# Patient Record
Sex: Male | Born: 1955
Health system: Southern US, Community
[De-identification: ages and names within clinical notes are randomized; demographics above are authoritative.]

## PROBLEM LIST (undated history)

## (undated) DIAGNOSIS — M1990 Unspecified osteoarthritis, unspecified site: Secondary | ICD-10-CM

## (undated) DIAGNOSIS — R7881 Bacteremia: Secondary | ICD-10-CM

## (undated) DIAGNOSIS — K4091 Unilateral inguinal hernia, without obstruction or gangrene, recurrent: Secondary | ICD-10-CM

## (undated) DIAGNOSIS — Z923 Personal history of irradiation: Secondary | ICD-10-CM

## (undated) HISTORY — PX: NO PAST SURGERIES: SHX2092

## (undated) HISTORY — DX: Unspecified osteoarthritis, unspecified site: M19.90

---

## 2001-07-07 ENCOUNTER — Encounter: Payer: Self-pay | Admitting: Emergency Medicine

## 2001-07-07 ENCOUNTER — Emergency Department (HOSPITAL_COMMUNITY): Admission: EM | Admit: 2001-07-07 | Discharge: 2001-07-07 | Payer: Self-pay | Admitting: Emergency Medicine

## 2002-08-30 ENCOUNTER — Emergency Department (HOSPITAL_COMMUNITY): Admission: EM | Admit: 2002-08-30 | Discharge: 2002-08-30 | Payer: Self-pay | Admitting: Emergency Medicine

## 2004-05-28 ENCOUNTER — Emergency Department (HOSPITAL_COMMUNITY): Admission: EM | Admit: 2004-05-28 | Discharge: 2004-05-28 | Payer: Self-pay | Admitting: Emergency Medicine

## 2014-08-26 ENCOUNTER — Encounter (HOSPITAL_COMMUNITY): Payer: Self-pay

## 2014-08-26 ENCOUNTER — Emergency Department (INDEPENDENT_AMBULATORY_CARE_PROVIDER_SITE_OTHER)
Admission: EM | Admit: 2014-08-26 | Discharge: 2014-08-26 | Disposition: A | Discharge status: Home / Self Care | Payer: Self-pay | Source: Home / Self Care | Attending: Emergency Medicine | Admitting: Emergency Medicine

## 2014-08-26 DIAGNOSIS — K4090 Unilateral inguinal hernia, without obstruction or gangrene, not specified as recurrent: Secondary | ICD-10-CM

## 2014-08-26 MED ORDER — TRAMADOL HCL 50 MG PO TABS: 50.0000 mg | ORAL_TABLET | Freq: Four times a day (QID) | ORAL | Status: DC | PRN

## 2014-08-26 NOTE — ED Provider Notes (Signed)
CSN: [id number]     Arrival date & time 08/26/14  0803 History   First MD Initiated Contact with Patient 08/26/14 [phone]     Chief Complaint  Patient presents with  . Hernia   (Consider location/radiation/quality/duration/timing/severity/associated sxs/prior Treatment) HPI  He is a 59 year old man here for evaluation of a hernia. He states for at least 2 months he has had bulging in the right groin. It is associated with pain. He states anytime he stands up there is a bulge in the right groin. He is wearing tight underwear with a rolled up towel to help keep the hernia reduced. When he lays flat there is no bulge. It is tender. No difficulty urinating. He is having normal bowel movements. No fevers.  History reviewed. No pertinent past medical history. History reviewed. No pertinent past surgical history. History reviewed. No pertinent family history. History  Substance Use Topics  . Smoking status: Current Every Day Smoker  . Smokeless tobacco: Not on file  . Alcohol Use: Yes    Review of Systems  Constitutional: Negative for fever.  Gastrointestinal: Negative for abdominal pain, diarrhea and constipation.  Genitourinary: Negative for difficulty urinating.       Groin swelling and tenderness    Allergies  Review of patient's allergies indicates no known allergies.  Home Medications   Prior to Admission medications   Medication Sig Start Date End Date Taking? Authorizing Provider  traMADol (ULTRAM) 50 MG tablet Take 1 tablet (50 mg total) by mouth every 6 (six) hours as needed. 08/26/14   Charm RingsErin J Lorenza Shakir, MD   BP 93/52 mmHg  Pulse 92  Temp(Src) 98.4 F (36.9 C) (Oral)  Resp 18  SpO2 100% Physical Exam  Constitutional: He is oriented to person, place, and time. He appears well-developed and well-nourished. No distress.  Cardiovascular: Normal rate.   Pulmonary/Chest: Effort normal.  Abdominal: Soft. A hernia is present. Hernia confirmed positive in the right inguinal area.  Hernia confirmed negative in the left inguinal area.  Genitourinary: Testes normal.     Neurological: He is alert and oriented to person, place, and time.    ED Course  Procedures (including critical care time) Labs Review Labs Reviewed - No data to display  Imaging Review No results found.   MDM   1. Right inguinal hernia    Recommended follow-up with surgery to discuss elective repair. This may be an issue as he does not have insurance. Reviewed signs of strangulation and incarceration that warrant immediate evaluation in the emergency room. Prescription for tramadol provided to use as needed for severe pain.    Charm RingsErin J Ritta Hammes, MD 08/26/14 [id number]

## 2014-08-26 NOTE — Discharge Instructions (Signed)
You have a hernia. Please call the surgeon's office to set up an appointment, as this will need to be fixed surgically. Take Tylenol or ibuprofen as needed for pain. I have provided a few tablets of tramadol to use for severe pain. If that bulge does not go down when you lay down, it becomes red, hard, very tender, or you develop fevers, please go directly to the emergency room.  Inguinal Hernia, Adult Muscles help keep everything in the body in its proper place. But if a weak spot in the muscles develops, something can poke through. That is called a hernia. When this happens in the lower part of the belly (abdomen), it is called an inguinal hernia. (It takes its name from a part of the body in this region called the inguinal canal.) A weak spot in the wall of muscles lets some fat or part of the small intestine bulge through. An inguinal hernia can develop at any age. Men get them more often than women. CAUSES  In adults, an inguinal hernia develops over time.  It can be triggered by:  Suddenly straining the muscles of the lower abdomen.  Lifting heavy objects.  Straining to have a bowel movement. Difficult bowel movements (constipation) can lead to this.  Constant coughing. This may be caused by smoking or lung disease.  Being overweight.  Being pregnant.  Working at a job that requires long periods of standing or heavy lifting.  Having had an inguinal hernia before. One type can be an emergency situation. It is called a strangulated inguinal hernia. It develops if part of the small intestine slips through the weak spot and cannot get back into the abdomen. The blood supply can be cut off. If that happens, part of the intestine may die. This situation requires emergency surgery. SYMPTOMS  Often, a small inguinal hernia has no symptoms. It is found when a healthcare provider does a physical exam. Larger hernias usually have symptoms.   In adults, symptoms may include:  A lump in  the groin. This is easier to see when the person is standing. It might disappear when lying down.  In men, a lump in the scrotum.  Pain or burning in the groin. This occurs especially when lifting, straining or coughing.  A dull ache or feeling of pressure in the groin.  Signs of a strangulated hernia can include:  A bulge in the groin that becomes very painful and tender to the touch.  A bulge that turns red or purple.  Fever, nausea and vomiting.  Inability to have a bowel movement or to pass gas. DIAGNOSIS  To decide if you have an inguinal hernia, a healthcare provider will probably do a physical examination.  This will include asking questions about any symptoms you have noticed.  The healthcare provider might feel the groin area and ask you to cough. If an inguinal hernia is felt, the healthcare provider may try to slide it back into the abdomen.  Usually no other tests are needed. TREATMENT  Treatments can vary. The size of the hernia makes a difference. Options include:  Watchful waiting. This is often suggested if the hernia is small and you have had no symptoms.  No medical procedure will be done unless symptoms develop.  You will need to watch closely for symptoms. If any occur, contact your healthcare provider right away.  Surgery. This is used if the hernia is larger or you have symptoms.  Open surgery. This is usually an outpatient  procedure (you will not stay overnight in a hospital). An cut (incision) is made through the skin in the groin. The hernia is put back inside the abdomen. The weak area in the muscles is then repaired by herniorrhaphy or hernioplasty. Herniorrhaphy: in this type of surgery, the weak muscles are sewn back together. Hernioplasty: a patch or mesh is used to close the weak area in the abdominal wall.  Laparoscopy. In this procedure, a surgeon makes small incisions. A thin tube with a tiny video camera (called a laparoscope) is put into the  abdomen. The surgeon repairs the hernia with mesh by looking with the video camera and using two long instruments. HOME CARE INSTRUCTIONS   After surgery to repair an inguinal hernia:  You will need to take pain medicine prescribed by your healthcare provider. Follow all directions carefully.  You will need to take care of the wound from the incision.  Your activity will be restricted for awhile. This will probably include no heavy lifting for several weeks. You also should not do anything too active for a few weeks. When you can return to work will depend on the type of job that you have.  During "watchful waiting" periods, you should:  Maintain a healthy weight.  Eat a diet high in fiber (fruits, vegetables and whole grains).  Drink plenty of fluids to avoid constipation. This means drinking enough water and other liquids to keep your urine clear or pale yellow.  Do not lift heavy objects.  Do not stand for long periods of time.  Quit smoking. This should keep you from developing a frequent cough. SEEK MEDICAL CARE IF:   A bulge develops in your groin area.  You feel pain, a burning sensation or pressure in the groin. This might be worse if you are lifting or straining.  You develop a fever of more than 100.5 F (38.1 C). SEEK IMMEDIATE MEDICAL CARE IF:   Pain in the groin increases suddenly.  A bulge in the groin gets bigger suddenly and does not go down.  For men, there is sudden pain in the scrotum. Or, the size of the scrotum increases.  A bulge in the groin area becomes red or purple and is painful to touch.  You have nausea or vomiting that does not go away.  You feel your heart beating much faster than normal.  You cannot have a bowel movement or pass gas.  You develop a fever of more than 102.0 F (38.9 C). Document Released: 09/08/2008 Document Revised: 07/15/2011 Document Reviewed: 09/08/2008 [facility] Patient Information 2015 [place], [place]. This  information is not intended to replace advice given to you by your health care provider. Make sure you discuss any questions you have with your health care provider.

## 2014-08-26 NOTE — ED Notes (Signed)
C/o pain in groin x couple of months , worse when standing for prolonged periods of time . Denies bowel problems  Or UA problems, passing gas w/o problems

## 2015-08-01 ENCOUNTER — Emergency Department (HOSPITAL_COMMUNITY): Payer: Self-pay

## 2015-08-01 ENCOUNTER — Encounter (HOSPITAL_COMMUNITY): Payer: Self-pay | Admitting: Emergency Medicine

## 2015-08-01 ENCOUNTER — Emergency Department (HOSPITAL_COMMUNITY)
Admission: EM | Admit: 2015-08-01 | Discharge: 2015-08-01 | Disposition: A | Discharge status: Home / Self Care | Payer: Self-pay | Attending: Emergency Medicine | Admitting: Emergency Medicine

## 2015-08-01 DIAGNOSIS — F17200 Nicotine dependence, unspecified, uncomplicated: Secondary | ICD-10-CM | POA: Insufficient documentation

## 2015-08-01 DIAGNOSIS — J101 Influenza due to other identified influenza virus with other respiratory manifestations: Secondary | ICD-10-CM | POA: Insufficient documentation

## 2015-08-01 LAB — CBC
HCT: 48.6 % (ref 39.0–52.0)
Hemoglobin: 17 g/dL (ref 13.0–17.0)
MCH: 30.5 pg (ref 26.0–34.0)
MCHC: 35 g/dL (ref 30.0–36.0)
MCV: 87.1 fL (ref 78.0–100.0)
PLATELETS: 204 10*3/uL (ref 150–400)
RBC: 5.58 MIL/uL (ref 4.22–5.81)
RDW: 14.1 % (ref 11.5–15.5)
WBC: 5.7 10*3/uL (ref 4.0–10.5)

## 2015-08-01 LAB — COMPREHENSIVE METABOLIC PANEL
ALT: 45 U/L (ref 17–63)
AST: 81 U/L — AB (ref 15–41)
Albumin: 3.8 g/dL (ref 3.5–5.0)
Alkaline Phosphatase: 78 U/L (ref 38–126)
Anion gap: 10 (ref 5–15)
BILIRUBIN TOTAL: 0.6 mg/dL (ref 0.3–1.2)
BUN: 5 mg/dL — AB (ref 6–20)
CO2: 25 mmol/L (ref 22–32)
CREATININE: 1.07 mg/dL (ref 0.61–1.24)
Calcium: 9.1 mg/dL (ref 8.9–10.3)
Chloride: 101 mmol/L (ref 101–111)
Glucose, Bld: 113 mg/dL — ABNORMAL HIGH (ref 65–99)
Potassium: 4.3 mmol/L (ref 3.5–5.1)
Sodium: 136 mmol/L (ref 135–145)
TOTAL PROTEIN: 7.4 g/dL (ref 6.5–8.1)

## 2015-08-01 LAB — I-STAT CG4 LACTIC ACID, ED
LACTIC ACID, VENOUS: 2.87 mmol/L — AB (ref 0.5–2.0)
LACTIC ACID, VENOUS: 2.36 mmol/L — AB (ref 0.5–2.0)

## 2015-08-01 LAB — INFLUENZA PANEL BY PCR (TYPE A & B)
H1N1FLUPCR: NOT DETECTED
INFLAPCR: NEGATIVE
INFLBPCR: POSITIVE — AB

## 2015-08-01 LAB — LIPASE, BLOOD: LIPASE: 21 U/L (ref 11–51)

## 2015-08-01 MED ORDER — SODIUM CHLORIDE 0.9 % IV BOLUS (SEPSIS)
2000.0000 mL | Freq: Once | INTRAVENOUS | Status: AC
  Administered 2015-08-01: 2000 mL via INTRAVENOUS

## 2015-08-01 MED ORDER — ALBUTEROL (5 MG/ML) CONTINUOUS INHALATION SOLN
10.0000 mg/h | INHALATION_SOLUTION | Freq: Once | RESPIRATORY_TRACT | Status: AC
  Administered 2015-08-01: 10 mg/h via RESPIRATORY_TRACT
  Filled 2015-08-01: qty 20

## 2015-08-01 MED ORDER — OSELTAMIVIR PHOSPHATE 75 MG PO CAPS
75.0000 mg | ORAL_CAPSULE | Freq: Once | ORAL | Status: AC
  Administered 2015-08-01: 75 mg via ORAL
  Filled 2015-08-01: qty 1

## 2015-08-01 MED ORDER — SODIUM CHLORIDE 0.9 % IV BOLUS (SEPSIS): 1000.0000 mL | INTRAVENOUS | Status: DC

## 2015-08-01 MED ORDER — DEXTROSE 5 % IV SOLN
1.0000 g | Freq: Once | INTRAVENOUS | Status: AC
  Administered 2015-08-01: 1 g via INTRAVENOUS
  Filled 2015-08-01: qty 10

## 2015-08-01 MED ORDER — ALBUTEROL SULFATE HFA 108 (90 BASE) MCG/ACT IN AERS
2.0000 | INHALATION_SPRAY | RESPIRATORY_TRACT | Status: DC | PRN
  Administered 2015-08-01: 2 via RESPIRATORY_TRACT
  Filled 2015-08-01: qty 6.7

## 2015-08-01 MED ORDER — AZITHROMYCIN 250 MG PO TABS
500.0000 mg | ORAL_TABLET | Freq: Once | ORAL | Status: AC
  Administered 2015-08-01: 500 mg via ORAL
  Filled 2015-08-01: qty 2

## 2015-08-01 MED ORDER — OSELTAMIVIR PHOSPHATE 75 MG PO CAPS: 75.0000 mg | ORAL_CAPSULE | Freq: Two times a day (BID) | ORAL | Status: DC

## 2015-08-01 MED ORDER — AEROCHAMBER PLUS W/MASK MISC
1.0000 | Freq: Once | Status: AC
  Administered 2015-08-01: 1
  Filled 2015-08-01: qty 1

## 2015-08-01 MED ORDER — ACETAMINOPHEN 325 MG PO TABS
ORAL_TABLET | ORAL | Status: DC
Start: 2015-08-01 — End: 2015-08-02
  Filled 2015-08-01: qty 1

## 2015-08-01 MED ORDER — ACETAMINOPHEN 325 MG PO TABS
650.0000 mg | ORAL_TABLET | Freq: Once | ORAL | Status: AC | PRN
  Administered 2015-08-01: 650 mg via ORAL

## 2015-08-01 NOTE — Discharge Instructions (Signed)
Get plenty of rest, and drink a lot of fluids. Take Tylenol every 4 hours, for fever.   Influenza, Adult Influenza ("the flu") is a viral infection of the respiratory tract. It occurs more often in winter months because people spend more time in close contact with one another. Influenza can make you feel very sick. Influenza easily spreads from person to person (contagious). CAUSES  Influenza is caused by a virus that infects the respiratory tract. You can catch the virus by breathing in droplets from an infected person's cough or sneeze. You can also catch the virus by touching something that was recently contaminated with the virus and then touching your mouth, nose, or eyes. RISKS AND COMPLICATIONS You may be at risk for a more severe case of influenza if you smoke cigarettes, have diabetes, have chronic heart disease (such as heart failure) or lung disease (such as asthma), or if you have a weakened immune system. Elderly people and pregnant women are also at risk for more serious infections. The most common problem of influenza is a lung infection (pneumonia). Sometimes, this problem can require emergency medical care and may be life threatening. SIGNS AND SYMPTOMS  Symptoms typically last 4 to 10 days and may include:  Fever.  Chills.  Headache, body aches, and muscle aches.  Sore throat.  Chest discomfort and cough.  Poor appetite.  Weakness or feeling tired.  Dizziness.  Nausea or vomiting. DIAGNOSIS  Diagnosis of influenza is often made based on your history and a physical exam. A nose or throat swab test can be done to confirm the diagnosis. TREATMENT  In mild cases, influenza goes away on its own. Treatment is directed at relieving symptoms. For more severe cases, your health care provider may prescribe antiviral medicines to shorten the sickness. Antibiotic medicines are not effective because the infection is caused by a virus, not by bacteria. HOME CARE  INSTRUCTIONS  Take medicines only as directed by your health care provider.  Use a cool mist humidifier to make breathing easier.  Get plenty of rest until your temperature returns to normal. This usually takes 3 to 4 days.  Drink enough fluid to keep your urine clear or pale yellow.  Cover yourmouth and nosewhen coughing or sneezing,and wash your handswellto prevent thevirusfrom spreading.  Stay homefromwork orschool untilthe fever is gonefor at least 451full day. PREVENTION  An annual influenza vaccination (flu shot) is the best way to avoid getting influenza. An annual flu shot is now routinely recommended for all adults in the [place] SEEK MEDICAL CARE IF:  You experiencechest pain, yourcough worsens,or you producemore mucus.  Youhave nausea,vomiting, ordiarrhea.  Your fever returns or gets worse. SEEK IMMEDIATE MEDICAL CARE IF:  You havetrouble breathing, you become short of breath,or your skin ornails becomebluish.  You have severe painor stiffnessin the neck.  You develop a sudden headache, or pain in the face or ear.  You have nausea or vomiting that you cannot control. MAKE SURE YOU:   Understand these instructions.  Will watch your condition.  Will get help right away if you are not doing well or get worse.   This information is not intended to replace advice given to you by your health care provider. Make sure you discuss any questions you have with your health care provider.   Document Released: 04/19/2000 Document Revised: 05/13/2014 Document Reviewed: 07/22/2011 Elsevier Interactive Patient Education [employer]2016 [employer] Inc.

## 2015-08-01 NOTE — Progress Notes (Signed)
Pharmacy Code Sepsis Protocol  Time of code sepsis page: 1706 [x]  Antibiotics delivered at 1715  Were antibiotics ordered at the time of the code sepsis page? Yes Was it required to contact the physician? [x]  Physician not contacted []  Physician contacted to order antibiotics for code sepsis []  Physician contacted to recommend changing antibiotics  Anti-infectives    Start     Dose/Rate Route Frequency Ordered Stop   08/01/15 1715  cefTRIAXone (ROCEPHIN) 1 g in dextrose 5 % 50 mL IVPB     1 g 100 mL/hr over 30 Minutes Intravenous  Once 08/01/15 1702     08/01/15 1715  azithromycin (ZITHROMAX) tablet 500 mg     500 mg Oral  Once 08/01/15 1702          Nurse education provided: [x]  Minutes left to administer antibiotics to achieve 1 hour goal [x]  Correct order of antibiotic administration [x]  Antibiotic Y-site compatibilities    Lynette Topete C. Marvis MoellerMiles, PharmD Pharmacy Resident  Pager: [phone] 08/01/2015 5:09 PM

## 2015-08-01 NOTE — ED Provider Notes (Signed)
CSN: [id number]     Arrival date & time 08/01/15  7425 History   First MD Initiated Contact with Patient 08/01/15 1613     Chief Complaint  Patient presents with  . Abdominal Pain  . Cough     (Consider location/radiation/quality/duration/timing/severity/associated sxs/prior Treatment) HPI   Marcus Pace is a 60 y.o. male who presents for evaluation of shortness of breath. He is also felt feverish for several days. He has a cough which is nonproductive. He denies ear pain, sore throat, myalgia, nausea, vomiting, dizziness, chest pain, abdominal pain or back pain. He does not have a primary care doctor, and has no chronic medical conditions. He smokes cigarettes. There are no other known modifying factors.   History reviewed. No pertinent past medical history. History reviewed. No pertinent past surgical history. No family history on file. Social History  Substance Use Topics  . Smoking status: Current Every Day Smoker  . Smokeless tobacco: None  . Alcohol Use: Yes    Review of Systems  All other systems reviewed and are negative.     Allergies  Review of patient's allergies indicates no known allergies.  Home Medications   Prior to Admission medications   Medication Sig Start Date End Date Taking? Authorizing Provider  oseltamivir (TAMIFLU) 75 MG capsule Take 1 capsule (75 mg total) by mouth every 12 (twelve) hours. 08/01/15   Mancel Bale, MD  traMADol (ULTRAM) 50 MG tablet Take 1 tablet (50 mg total) by mouth every 6 (six) hours as needed. Patient not taking: Reported on 08/01/2015 08/26/14   Charm Rings, MD   BP 134/84 mmHg  Pulse 105  Temp(Src) 100.8 F (38.2 C) (Oral)  Resp 36  Ht  (1.854 m)  Wt 135 lb 1 oz (61.264 kg)  BMI 17.82 kg/m2  SpO2 97% Physical Exam  Constitutional: He is oriented to person, place, and time. He appears well-developed and well-nourished. No distress (Nontoxic appearance).  HENT:  Head: Normocephalic and atraumatic.  Right  Ear: External ear normal.  Left Ear: External ear normal.  Eyes: Conjunctivae and EOM are normal. Pupils are equal, round, and reactive to light.  Neck: Normal range of motion and phonation normal. Neck supple.  Cardiovascular: Normal rate, regular rhythm and normal heart sounds.   Pulmonary/Chest: Effort normal. No respiratory distress. He exhibits no tenderness and no bony tenderness.  Decreased air movement bilaterally without audible wheezes, Rales or rhonchi. There is no increased work of breathing.  Abdominal: Soft. There is no tenderness.  Musculoskeletal: Normal range of motion.  Neurological: He is alert and oriented to person, place, and time. No cranial nerve deficit or sensory deficit. He exhibits normal muscle tone. Coordination normal.  Skin: Skin is warm, dry and intact.  Psychiatric: He has a normal mood and affect. His behavior is normal. Judgment and thought content normal.  Nursing note and vitals reviewed.   ED Course  Procedures (including critical care time)  Initial impression is consistent with influenza with secondary bronchospasm, bronchitis. Doubt pneumonia, metabolic instability, severe sepsis, or impending vascular collapse. Initial lactate elevated at 2.87, therefore will give sepsis fluids, empiric antibiotics, and repeat lactate post fluids. I suspect a lactate will improve and he can be discharged with symptomatic treatment.  Medications  oseltamivir (TAMIFLU) capsule 75 mg (not administered)  albuterol (PROVENTIL HFA;VENTOLIN HFA) 108 (90 Base) MCG/ACT inhaler 2 puff (not administered)  aerochamber plus with mask device 1 each (not administered)  acetaminophen (TYLENOL) tablet 650 mg (650 mg Oral  Given 08/01/15 1433)  cefTRIAXone (ROCEPHIN) 1 g in dextrose 5 % 50 mL IVPB (0 g Intravenous Stopped 08/01/15 1753)  azithromycin (ZITHROMAX) tablet 500 mg (500 mg Oral Given 08/01/15 1723)  sodium chloride 0.9 % bolus 2,000 mL (0 mLs Intravenous Stopped 08/01/15  2042)  albuterol (PROVENTIL,VENTOLIN) solution continuous neb (10 mg/hr Nebulization Given 08/01/15 1713)    Patient Vitals for the past 24 hrs:  BP Temp Temp src Pulse Resp SpO2 Height Weight  08/01/15 2115 134/84 mmHg - - 105 - 97 % - -  08/01/15 2103 - 100.8 F (38.2 C) Oral - - - - -  08/01/15 2045 140/77 mmHg - - 111 (!) 36 94 % - -  08/01/15 2015 129/82 mmHg - - 105 (!) 38 93 % - -  08/01/15 1945 137/83 mmHg - - 111 (!) 40 97 % - -  08/01/15 1845 133/64 mmHg - - 116 (!) 44 97 % - -  08/01/15 1830 136/73 mmHg - - 118 (!) 39 100 % - -  08/01/15 1815 148/75 mmHg - - 80 (!) 49 100 % - -  08/01/15 1745 134/80 mmHg - - 101 (!) 28 100 % - -  08/01/15 1730 128/89 mmHg - - 96 (!) 48 100 % - -  08/01/15 1713 - - - 107 22 96 % - -  08/01/15 1701 - 101.2 F (38.4 C) Rectal - - - - -  08/01/15 1615 136/98 mmHg - - 108 - 97 % - -  08/01/15 1426 147/100 mmHg 101.4 F (38.6 C) Oral (!) 127 22 96 % - -  08/01/15 1107 128/88 mmHg 100.5 F (38.1 C) Oral 120 (!) 32 93 %  (1.854 m) 135 lb 1 oz (61.264 kg)    10:44 PM Reevaluation with update and discussion. After initial assessment and treatment, an updated evaluation reveals Patient feels somewhat better at this time. Lungs have improved air movement after nebulizer. Patient feels well and is to go home. Findings discussed with patient and family member, all questions were answered. Marcus Pace   CRITICAL CARE Performed by: Mancel Bale Pace Total critical care time: 40 minutes Critical care time was exclusive of separately billable procedures and treating other patients. Critical care was necessary to treat or prevent imminent or life-threatening deterioration. Critical care was time spent personally by me on the following activities: development of treatment plan with patient and/or surrogate as well as nursing, discussions with consultants, evaluation of patient's response to treatment, examination of patient, obtaining history from  patient or surrogate, ordering and performing treatments and interventions, ordering and review of laboratory studies, ordering and review of radiographic studies, pulse oximetry and re-evaluation of patient's condition.   Labs Review Labs Reviewed  COMPREHENSIVE METABOLIC PANEL - Abnormal; Notable for the following:    Glucose, Bld 113 (*)    BUN 5 (*)    AST 81 (*)    All other components within normal limits  INFLUENZA PANEL BY PCR (TYPE A & B, H1N1) - Abnormal; Notable for the following:    Influenza B By PCR POSITIVE (*)    All other components within normal limits  I-STAT CG4 LACTIC ACID, ED - Abnormal; Notable for the following:    Lactic Acid, Venous 2.87 (*)    All other components within normal limits  I-STAT CG4 LACTIC ACID, ED - Abnormal; Notable for the following:    Lactic Acid, Venous 2.36 (*)    All other components within normal limits  CULTURE, BLOOD (  ROUTINE X 2)  CULTURE, BLOOD (ROUTINE X 2)  LIPASE, BLOOD  CBC  I-STAT CG4 LACTIC ACID, ED  I-STAT CG4 LACTIC ACID, ED    Imaging Review Dg Chest 2 View  08/01/2015  CLINICAL DATA:  Cough for 3 days EXAM: CHEST  2 VIEW COMPARISON:  None. FINDINGS: Allowing for nipple shadows, lungs are hyper aerated and clear. Normal heart size. No pneumothorax or pleural effusion. IMPRESSION: No active cardiopulmonary disease. Electronically Signed   By: Jolaine ClickArthur  Hoss M.D.   On: 08/01/2015 16:47   I have personally reviewed and evaluated these images and lab results as part of my medical decision-making.   EKG Interpretation   Date/Time:  Tuesday August 01 2015 16:19:07 EDT Ventricular Rate:  100 PR Interval:  153 QRS Duration: 70 QT Interval:  336 QTC Calculation: 433 R Axis:   81 Text Interpretation:  Sinus tachycardia Atrial premature complexes  Biatrial enlargement Anteroseptal infarct, age indeterminate No old  tracing to compare Confirmed by [facility]  MD, Zadkiel Dragan [phone]([id number]) on 08/01/2015  4:22:08 PM      MDM   Final  diagnoses:  Influenza B    Evaluation is consistent with influenza infection. Patient is low risk for complications, from influenza. There is currently no evidence for severe sepsis, metabolic instability or impending vascular collapse. Lactate, somewhat improved after IV fluid bolus. Patient symptomatically improved after treatment.  Nursing Notes Reviewed/ Care Coordinated Applicable Imaging Reviewed Interpretation of Laboratory Data incorporated into ED treatment  The patient appears reasonably screened and/or stabilized for discharge and I doubt any other medical condition or other [facility] requiring further screening, evaluation, or treatment in the ED at this time prior to discharge.  Plan: Home Medications- Tamiflu, Albuterol; Home Treatments- rest; return here if the recommended treatment, does not improve the symptoms; Recommended follow up- PCP prn    Mancel BaleElliott Erich Kochan, MD 08/01/15 2246

## 2015-08-01 NOTE — ED Notes (Signed)
Pt states "it hurts in my belly when I cough, it feels really tight in my belly". Pt denies N/V, states "i had a little bit diarrhea this morning."

## 2015-08-02 ENCOUNTER — Inpatient Hospital Stay (HOSPITAL_COMMUNITY)
Admission: EM | Admit: 2015-08-02 | Discharge: 2015-08-04 | DRG: 872 | Disposition: A | Discharge status: Home / Self Care | Payer: Self-pay | Attending: Internal Medicine | Admitting: Internal Medicine

## 2015-08-02 ENCOUNTER — Encounter (HOSPITAL_COMMUNITY): Payer: Self-pay | Admitting: Emergency Medicine

## 2015-08-02 ENCOUNTER — Telehealth: Payer: Self-pay | Admitting: *Deleted

## 2015-08-02 ENCOUNTER — Emergency Department (HOSPITAL_COMMUNITY): Payer: Self-pay

## 2015-08-02 ENCOUNTER — Inpatient Hospital Stay (HOSPITAL_COMMUNITY): Payer: Self-pay

## 2015-08-02 ENCOUNTER — Telehealth (HOSPITAL_BASED_OUTPATIENT_CLINIC_OR_DEPARTMENT_OTHER): Payer: Self-pay

## 2015-08-02 DIAGNOSIS — N5082 Scrotal pain: Secondary | ICD-10-CM | POA: Diagnosis present

## 2015-08-02 DIAGNOSIS — A419 Sepsis, unspecified organism: Secondary | ICD-10-CM | POA: Diagnosis present

## 2015-08-02 DIAGNOSIS — Z79899 Other long term (current) drug therapy: Secondary | ICD-10-CM

## 2015-08-02 DIAGNOSIS — Z681 Body mass index (BMI) 19 or less, adult: Secondary | ICD-10-CM

## 2015-08-02 DIAGNOSIS — J101 Influenza due to other identified influenza virus with other respiratory manifestations: Secondary | ICD-10-CM | POA: Diagnosis present

## 2015-08-02 DIAGNOSIS — F17210 Nicotine dependence, cigarettes, uncomplicated: Secondary | ICD-10-CM | POA: Diagnosis present

## 2015-08-02 DIAGNOSIS — E440 Moderate protein-calorie malnutrition: Secondary | ICD-10-CM | POA: Insufficient documentation

## 2015-08-02 DIAGNOSIS — Z720 Tobacco use: Secondary | ICD-10-CM | POA: Diagnosis present

## 2015-08-02 DIAGNOSIS — R7881 Bacteremia: Secondary | ICD-10-CM | POA: Diagnosis present

## 2015-08-02 DIAGNOSIS — K4090 Unilateral inguinal hernia, without obstruction or gangrene, not specified as recurrent: Secondary | ICD-10-CM | POA: Diagnosis present

## 2015-08-02 DIAGNOSIS — N50811 Right testicular pain: Secondary | ICD-10-CM | POA: Diagnosis present

## 2015-08-02 DIAGNOSIS — A4189 Other specified sepsis: Principal | ICD-10-CM | POA: Diagnosis present

## 2015-08-02 LAB — CBC WITH DIFFERENTIAL/PLATELET
BASOS PCT: 1 %
Basophils Absolute: 0 10*3/uL (ref 0.0–0.1)
Eosinophils Absolute: 0 10*3/uL (ref 0.0–0.7)
Eosinophils Relative: 0 %
HEMATOCRIT: 48 % (ref 39.0–52.0)
HEMOGLOBIN: 16.5 g/dL (ref 13.0–17.0)
LYMPHS ABS: 0.9 10*3/uL (ref 0.7–4.0)
Lymphocytes Relative: 19 %
MCH: 29.7 pg (ref 26.0–34.0)
MCHC: 34.4 g/dL (ref 30.0–36.0)
MCV: 86.3 fL (ref 78.0–100.0)
MONO ABS: 0.6 10*3/uL (ref 0.1–1.0)
MONOS PCT: 13 %
NEUTROS ABS: 3.1 10*3/uL (ref 1.7–7.7)
NEUTROS PCT: 67 %
Platelets: 179 10*3/uL (ref 150–400)
RBC: 5.56 MIL/uL (ref 4.22–5.81)
RDW: 13.9 % (ref 11.5–15.5)
WBC: 4.6 10*3/uL (ref 4.0–10.5)

## 2015-08-02 LAB — URINALYSIS, ROUTINE W REFLEX MICROSCOPIC
BILIRUBIN URINE: NEGATIVE
GLUCOSE, UA: NEGATIVE mg/dL
KETONES UR: NEGATIVE mg/dL
Leukocytes, UA: NEGATIVE
NITRITE: NEGATIVE
PH: 6 (ref 5.0–8.0)
Protein, ur: 100 mg/dL — AB
SPECIFIC GRAVITY, URINE: 1.011 (ref 1.005–1.030)

## 2015-08-02 LAB — COMPREHENSIVE METABOLIC PANEL
ALBUMIN: 3.5 g/dL (ref 3.5–5.0)
ALK PHOS: 63 U/L (ref 38–126)
ALT: 46 U/L (ref 17–63)
ANION GAP: 11 (ref 5–15)
AST: 74 U/L — ABNORMAL HIGH (ref 15–41)
BUN: <5 mg/dL — ABNORMAL LOW (ref 6–20)
CALCIUM: 8.8 mg/dL — AB (ref 8.9–10.3)
CHLORIDE: 101 mmol/L (ref 101–111)
CO2: 23 mmol/L (ref 22–32)
CREATININE: 1.04 mg/dL (ref 0.61–1.24)
GFR calc Af Amer: >60 mL/min (ref >60)
GFR calc non Af Amer: >60 mL/min (ref >60)
GLUCOSE: 103 mg/dL — AB (ref 65–99)
Potassium: 4.6 mmol/L (ref 3.5–5.1)
SODIUM: 135 mmol/L (ref 135–145)
Total Bilirubin: 0.8 mg/dL (ref 0.3–1.2)
Total Protein: 6.8 g/dL (ref 6.5–8.1)

## 2015-08-02 LAB — APTT: APTT: 31 s (ref 24–37)

## 2015-08-02 LAB — PROCALCITONIN

## 2015-08-02 LAB — URINE MICROSCOPIC-ADD ON

## 2015-08-02 LAB — PROTIME-INR
INR: 1.03 (ref 0.00–1.49)
PROTHROMBIN TIME: 13.7 s (ref 11.6–15.2)

## 2015-08-02 LAB — I-STAT CG4 LACTIC ACID, ED: LACTIC ACID, VENOUS: 1.58 mmol/L (ref 0.5–2.0)

## 2015-08-02 MED ORDER — BISACODYL 5 MG PO TBEC
5.0000 mg | DELAYED_RELEASE_TABLET | Freq: Every day | ORAL | Status: DC | PRN
Start: 2015-08-02 — End: 2015-08-04

## 2015-08-02 MED ORDER — NICOTINE 14 MG/24HR TD PT24
14.0000 mg | MEDICATED_PATCH | Freq: Every day | TRANSDERMAL | Status: DC
  Administered 2015-08-02 – 2015-08-04 (×3): 14 mg via TRANSDERMAL
  Filled 2015-08-02 (×3): qty 1

## 2015-08-02 MED ORDER — DEXTROSE 5 % IV SOLN
500.0000 mg | Freq: Once | INTRAVENOUS | Status: AC
  Administered 2015-08-02: 500 mg via INTRAVENOUS
  Filled 2015-08-02: qty 500

## 2015-08-02 MED ORDER — DEXTROSE 5 % IV SOLN: 1.0000 g | Freq: Once | INTRAVENOUS | Status: DC

## 2015-08-02 MED ORDER — SODIUM CHLORIDE 0.9% FLUSH
3.0000 mL | Freq: Two times a day (BID) | INTRAVENOUS | Status: DC
  Administered 2015-08-02 – 2015-08-03 (×3): 3 mL via INTRAVENOUS

## 2015-08-02 MED ORDER — POLYETHYLENE GLYCOL 3350 17 G PO PACK: 17.0000 g | PACK | Freq: Every day | ORAL | Status: DC | PRN

## 2015-08-02 MED ORDER — VANCOMYCIN HCL IN DEXTROSE 750-5 MG/150ML-% IV SOLN
750.0000 mg | Freq: Two times a day (BID) | INTRAVENOUS | Status: DC
  Administered 2015-08-03: 750 mg via INTRAVENOUS
  Filled 2015-08-02 (×2): qty 150

## 2015-08-02 MED ORDER — SODIUM CHLORIDE 0.9 % IV BOLUS (SEPSIS)
1000.0000 mL | INTRAVENOUS | Status: AC
  Administered 2015-08-02 (×2): 1000 mL via INTRAVENOUS

## 2015-08-02 MED ORDER — ACETAMINOPHEN 325 MG PO TABS: 650.0000 mg | ORAL_TABLET | Freq: Four times a day (QID) | ORAL | Status: DC | PRN

## 2015-08-02 MED ORDER — ONDANSETRON HCL 4 MG PO TABS: 4.0000 mg | ORAL_TABLET | Freq: Four times a day (QID) | ORAL | Status: DC | PRN

## 2015-08-02 MED ORDER — HYDROCODONE-ACETAMINOPHEN 5-325 MG PO TABS: 1.0000 | ORAL_TABLET | ORAL | Status: DC | PRN

## 2015-08-02 MED ORDER — OSELTAMIVIR PHOSPHATE 75 MG PO CAPS
75.0000 mg | ORAL_CAPSULE | Freq: Two times a day (BID) | ORAL | Status: DC
  Administered 2015-08-02 – 2015-08-04 (×4): 75 mg via ORAL
  Filled 2015-08-02 (×5): qty 1

## 2015-08-02 MED ORDER — ACETAMINOPHEN 325 MG PO TABS
650.0000 mg | ORAL_TABLET | Freq: Once | ORAL | Status: AC
  Administered 2015-08-02: 650 mg via ORAL
  Filled 2015-08-02: qty 2

## 2015-08-02 MED ORDER — ENOXAPARIN SODIUM 40 MG/0.4ML ~~LOC~~ SOLN
40.0000 mg | SUBCUTANEOUS | Status: DC
  Administered 2015-08-02 – 2015-08-03 (×2): 40 mg via SUBCUTANEOUS
  Filled 2015-08-02 (×2): qty 0.4

## 2015-08-02 MED ORDER — VANCOMYCIN HCL IN DEXTROSE 1-5 GM/200ML-% IV SOLN
1000.0000 mg | Freq: Once | INTRAVENOUS | Status: AC
  Administered 2015-08-02: 1000 mg via INTRAVENOUS
  Filled 2015-08-02: qty 200

## 2015-08-02 MED ORDER — DEXTROSE 5 % IV SOLN
1.0000 g | INTRAVENOUS | Status: DC
  Administered 2015-08-02: 1 g via INTRAVENOUS
  Filled 2015-08-02: qty 10

## 2015-08-02 MED ORDER — ONDANSETRON HCL 4 MG/2ML IJ SOLN: 4.0000 mg | Freq: Four times a day (QID) | INTRAMUSCULAR | Status: DC | PRN

## 2015-08-02 MED ORDER — SODIUM CHLORIDE 0.9 % IV SOLN
INTRAVENOUS | Status: AC
  Administered 2015-08-02: 23:00:00 via INTRAVENOUS

## 2015-08-02 MED ORDER — ACETAMINOPHEN 650 MG RE SUPP: 650.0000 mg | Freq: Four times a day (QID) | RECTAL | Status: DC | PRN

## 2015-08-02 NOTE — ED Provider Notes (Signed)
CSN: [id number]     Arrival date & time 08/02/15  1622 History   First MD Initiated Contact with Patient 08/02/15 1649     Chief Complaint  Patient presents with  . Abnormal Lab     (Consider location/radiation/quality/duration/timing/severity/associated sxs/prior Treatment) HPI  60 year old male presents after being called by Patrcia Dolly cone to come back for a positive blood culture. Patient states over the last 3 days he's been having cough, fever or this is subjective), shortness of breath, diarrhea, and some sore throat. Patient was seen here yesterday. Also having upper abdominal pain. Patient was diagnosed with influenza and started on Tamiflu. Took a dose was recently at 1 PM today. Patient states that he feels slightly better but was called and told to come back.  History reviewed. No pertinent past medical history. History reviewed. No pertinent past surgical history. History reviewed. No pertinent family history. Social History  Substance Use Topics  . Smoking status: Current Every Day Smoker  . Smokeless tobacco: None  . Alcohol Use: Yes    Review of Systems  Constitutional: Positive for fever.  HENT: Positive for sore throat.   Respiratory: Positive for cough and shortness of breath.   Cardiovascular: Negative for chest pain.  Gastrointestinal: Positive for abdominal pain and diarrhea. Negative for vomiting.  Genitourinary: Positive for frequency.  All other systems reviewed and are negative.     Allergies  Review of patient's allergies indicates no known allergies.  Home Medications   Prior to Admission medications   Medication Sig Start Date End Date Taking? Authorizing Provider  oseltamivir (TAMIFLU) 75 MG capsule Take 1 capsule (75 mg total) by mouth every 12 (twelve) hours. 08/01/15   Mancel Bale, MD  traMADol (ULTRAM) 50 MG tablet Take 1 tablet (50 mg total) by mouth every 6 (six) hours as needed. Patient not taking: Reported on 08/01/2015 08/26/14   Charm Rings, MD   BP 82/71 mmHg  Pulse 118  Temp(Src) 100.8 F (38.2 C) (Oral)  Resp 24  SpO2 94% Physical Exam  Constitutional: He is oriented to person, place, and time. He appears well-developed and well-nourished.  HENT:  Head: Normocephalic and atraumatic.  Right Ear: External ear normal.  Left Ear: External ear normal.  Nose: Nose normal.  Mouth/Throat: No oropharyngeal exudate.  Eyes: Right eye exhibits no discharge. Left eye exhibits no discharge.  Neck: Normal range of motion. Neck supple.  meningismus  Cardiovascular: Regular rhythm, normal heart sounds and intact distal pulses.  Tachycardia present.   Pulmonary/Chest: Effort normal. Tachypnea noted. No respiratory distress. He has decreased breath sounds (diffuse decreased breath sounds). He has no wheezes.  Abdominal: Soft. There is no tenderness.  Musculoskeletal: He exhibits no edema.  Neurological: He is alert and oriented to person, place, and time.  Skin: Skin is warm and dry.  Nursing note and vitals reviewed.   ED Course  Procedures (including critical care time) Labs Review Labs Reviewed  COMPREHENSIVE METABOLIC PANEL - Abnormal; Notable for the following:    Glucose, Bld 103 (*)    BUN <5 (*)    Calcium 8.8 (*)    AST 74 (*)    All other components within normal limits  URINALYSIS, ROUTINE W REFLEX MICROSCOPIC (NOT AT [facility]) - Abnormal; Notable for the following:    Hgb urine dipstick TRACE (*)    Protein, ur 100 (*)    All other components within normal limits  URINE MICROSCOPIC-ADD ON - Abnormal; Notable for the following:  Squamous Epithelial / LPF 0-5 (*)    Bacteria, UA RARE (*)    All other components within normal limits  CULTURE, BLOOD (ROUTINE X 2)  CULTURE, BLOOD (ROUTINE X 2)  URINE CULTURE  CBC WITH DIFFERENTIAL/PLATELET  PROCALCITONIN  PROTIME-INR  APTT  BASIC METABOLIC PANEL  I-STAT CG4 LACTIC ACID, ED    Imaging Review Dg Chest 2 View  08/01/2015  CLINICAL DATA:  Cough for 3  days EXAM: CHEST  2 VIEW COMPARISON:  None. FINDINGS: Allowing for nipple shadows, lungs are hyper aerated and clear. Normal heart size. No pneumothorax or pleural effusion. IMPRESSION: No active cardiopulmonary disease. Electronically Signed   By: Jolaine ClickArthur  Hoss M.D.   On: 08/01/2015 16:47   I have personally reviewed and evaluated these images and lab results as part of my medical decision-making.   EKG Interpretation None      MDM   Final diagnoses:  None  Influenza  The patient's positive blood culture was most likely a contaminant given only 1/2 blood cultures were positive for gram-positive cocci. There is no pneumonia on chest x-ray. Initially however he is concerning for sepsis with an initial blood pressure in the 80s, tachycardia, and temperature of 100.8. Thus he was given Rocephin/azithro given initial cough but with negative second x-ray this is probably all influenza. Took Tamiflu just a few hours prior to arrival so I do not think he needs a repeat dose currently. He was given fluids per sepsis protocol. Currently his lactate is normal whereas yesterday was in the 2-3 range. Blood pressure has remained stable since initial fluids. He will need to be admitted to the hospitalist for further treatment and care for influenza.    Pricilla LovelessScott Marni Franzoni, MD 08/02/15 [phone]

## 2015-08-02 NOTE — ED Notes (Addendum)
Pt instructed to return to ED for + blood cultures; pt sts some cough and fever

## 2015-08-02 NOTE — H&P (Signed)
[facility] History and Physical  PATRICIO POPWELL ZOX:[id number] DOB: 11-09-1955 DOA: 08/02/2015  Referring physician: ED physician PCP: No primary care provider on file.  Specialists: None listed   Chief Complaint:  Fever, cough, called back for positive blood cx   HPI: Marcus Pace is a 60 y.o. male with PMH of tobacco abuse and back pain who presented to the ED yesterday afternoon for evaluation of fevers and cough, was diagnosed with influenza and discharged home with Tamiflu, but called back to the ED today for evaluation of a positive blood culture. Patient does not see a primary care physician but reports that he usually enjoys good health until 07/30/2015 when he developed copious rhinorrhea and fevers. The following day, as the rhinorrhea slowed, he developed a nonproductive cough and malaise. On his initial presentation yesterday, he was febrile, tachycardic, tachypneic, and with lactic acid of 2.87. Chest x-ray was negative for a pneumonia and lactic acidosis resolved with IV fluids. Blood cultures were obtained at that time and one of 2 bottles has demonstrated growth of gram-positive cocci after approximately 18 hours incubation. Patient was advised to return to the emergency department for evaluation of this. Since the ED visit yesterday, patient's symptoms have not changed in any significant way. He continues to take Tamiflu as directed. He denies dysuria, suprapubic pain, or flank tenderness. He denies any wounds or abscesses. However, patient reports swelling, erythema, and tenderness in the right scrotum that has progressed over several months.  In ED, patient was found to be febrile to 38.2 C, saturating adequately on room air, but tachypneic in the 30s and tachycardic in the 110s. Blood pressure is remained stable. Chest x-ray is again negative for acute cardiopulmonary disease and blood work, including CMP and CBC is unremarkable. Urine was obtained for analysis and  grossly negative for infection. Urine and blood were sent for culture and the patient was started on empiric antibiotics with azithromycin and Rocephin. He was given a 1 L normal saline bolus and his fever was treated with acetaminophen. He remained stable in the emergency department and will be admitted to the hospital for ongoing evaluation and management of sepsis with possible gram-positive cocci bacteremia.  Where does patient live?   At home    Can patient participate in ADLs?  Yes         Review of Systems:   General: no sweats, weight change, or poor appetite. Fever, chills, fatigue HEENT: no blurry vision, hearing changes or sore throat. Rhinorrhea Pulm: no dyspnea or wheeze. Cough.  CV: no chest pain or palpitations Abd: no vomiting, diarrhea, or constipation. Nausea, abd pain with coughing GU: no dysuria, hematuria, increased urinary frequency, or urgency. Right scrotal pain, erythema  Ext: no leg edema Neuro: no focal weakness, numbness, or tingling, no vision change or hearing loss Skin: no rash, no wounds MSK: No muscle spasm, no deformity, no red, hot, or swollen joint Heme: No easy bruising or bleeding Travel history: No recent long distant travel    Allergy: No Known Allergies  History reviewed. No pertinent past medical history.  History reviewed. No pertinent past surgical history.  Social History:  reports that he has been smoking.  He does not have any smokeless tobacco history on file. He reports that he drinks alcohol. His drug history is not on file.  Family History: History reviewed. No pertinent family history.   Prior to Admission medications   Medication Sig Start Date End Date Taking? Authorizing Provider  oseltamivir (  TAMIFLU) 75 MG capsule Take 1 capsule (75 mg total) by mouth every 12 (twelve) hours. 08/01/15  Yes Mancel Bale, MD  traMADol (ULTRAM) 50 MG tablet Take 1 tablet (50 mg total) by mouth every 6 (six) hours as needed. Patient not taking:  Reported on 08/01/2015 08/26/14   Charm Rings, MD    Physical Exam: Filed Vitals:   08/02/15 1900 08/02/15 1922 08/02/15 1923 08/02/15 1945  BP: 122/73 100/82 100/82 116/86  Pulse: 95  96 91  Temp:      TempSrc:      Resp:   28 32  Weight:      SpO2: 97%  96% 95%   General: Not in acute distress, but obvious discomfort  HEENT:       Eyes: PERRL, EOMI, no scleral icterus or conjunctival pallor.       ENT: No discharge from the ears or nose, no pharyngeal ulcers, oral mucosa dry.        Neck: No JVD, no bruit, no appreciable mass Heme: No cervical adenopathy, no pallor Cardiac: Rate ~120 and regular, No murmurs, No gallops or rubs. Pulm: Good air movement bilaterally. No rales, wheezing, rhonchi or rubs. Abd: Soft, nondistended, nontender, no rebound pain or gaurding, BS present. GU:  Soft-tissue swelling in right scrotum with tenderness and overlying erythema.  Ext: No LE edema bilaterally. 2+DP/PT pulse bilaterally. Musculoskeletal: No gross deformity, no red, hot, swollen joints   Skin: No rashes or wounds on exposed surfaces  Neuro: Alert, oriented X3, cranial nerves II-XII grossly intact. No focal findings Psych: Patient is not overtly psychotic, appropriate mood and affect.  Labs on Admission:  Basic Metabolic Panel:  Recent Labs Lab 08/01/15 1126 08/02/15 1709  NA 136 135  K 4.3 4.6  CL 101 101  CO2 25 23  GLUCOSE 113* 103*  BUN 5* <5*  CREATININE 1.07 1.04  CALCIUM 9.1 8.8*   Liver Function Tests:  Recent Labs Lab 08/01/15 1126 08/02/15 1709  AST 81* 74*  ALT 45 46  ALKPHOS 78 63  BILITOT 0.6 0.8  PROT 7.4 6.8  ALBUMIN 3.8 3.5    Recent Labs Lab 08/01/15 1126  LIPASE 21   No results for input(s): AMMONIA in the last 168 hours. CBC:  Recent Labs Lab 08/01/15 1126 08/02/15 1709  WBC 5.7 4.6  NEUTROABS  --  3.1  HGB 17.0 16.5  HCT 48.6 48.0  MCV 87.1 86.3  PLT 204 179   Cardiac Enzymes: No results for input(s): CKTOTAL, CKMB,  CKMBINDEX, TROPONINI in the last 168 hours.  BNP (last 3 results) No results for input(s): BNP in the last 8760 hours.  ProBNP (last 3 results) No results for input(s): PROBNP in the last 8760 hours.  CBG: No results for input(s): GLUCAP in the last 168 hours.  Radiological Exams on Admission: Dg Chest 2 View  08/01/2015  CLINICAL DATA:  Cough for 3 days EXAM: CHEST  2 VIEW COMPARISON:  None. FINDINGS: Allowing for nipple shadows, lungs are hyper aerated and clear. Normal heart size. No pneumothorax or pleural effusion. IMPRESSION: No active cardiopulmonary disease. Electronically Signed   By: Jolaine Click M.D.   On: 08/01/2015 16:47   Dg Chest Port 1 View  08/02/2015  CLINICAL DATA:  Cough for 4 days with mild shortness of breath EXAM: PORTABLE CHEST 1 VIEW COMPARISON:  August 01, 2015 FINDINGS: There is no edema or consolidation. The heart size and pulmonary vascularity are normal. No adenopathy. There is degenerative change  in the thoracic spine. IMPRESSION: No edema or consolidation. Electronically Signed   By: Bretta BangWilliam  Woodruff III M.D.   On: 08/02/2015 17:18    EKG:  Not done in ED, will obtain as appropriate   Assessment/Plan  1. Sepsis with ?GPC bacteremia  - Meets sepsis criteria on admission with fever, tachycardia, tachypnea, ?bacteremia  - Suspect this is likely due solely to influenza, though given his significant illness, warrants admission for empiric tx while following repeat culture  - Only 1 of 2 cultures turned positive, but still at <24 hrs  - Organism not identified beyond GPC yet, anticipate will be staph epi, but will cover for MRSA overnight given his significant illness and difficulty distinguishing true bacteremia from the viral illness  - Received 1 L NS bolus in ED, will continue to hydrate with NS overnight  - Repeat blood cultures obtained prior to initiation of abx, will follow    2. Influenza B  - Continue treatment with Tamiflu to complete the course   - CXR without evidence of a secondary bacterial infection  - Supportive therapy  - Infection control measures with droplet isolation   3. Scrotal pain  - Red, tender mass in right scrotum evaluated with US as possible source of bacteremia  - Herniated bowel identified on US with no evidence of incarceration or ischemia  - Outpt follow-up for surgical consultation     4. Tobacco abuse  - Counseled toward cessation; his sister was recruited to help encourage abstinence  - RN to provide smoking cessation information prior to discharge  - Nicotine patch while inpt per pt request   DVT ppx:  SQ Lovenox   Code Status: Full code Family Communication:  Yes, patient's sister at bed side Disposition Plan: Admit to inpatient   Date of Service 08/02/2015    Briscoe Deutscherimothy S Hendrick Pavich, MD [facility] Pager [phone]  If 7PM-7AM, please contact night-coverage [email or web address] Password [email or web address] 08/02/2015, 7:58 PM

## 2015-08-02 NOTE — ED Notes (Signed)
Attempted to call report

## 2015-08-02 NOTE — Telephone Encounter (Signed)
No return call from voice message: Letter sent to Patient to address on file.

## 2015-08-02 NOTE — ED Notes (Signed)
(+)   blood culture, chart reviewed by Dr. Deretha EmoryZackowski and advised patient return to ED for further treatment.  Contacted sister of patient who verbalized understanding and will be bringing pt back to ED

## 2015-08-02 NOTE — Progress Notes (Signed)
Pharmacy Antibiotic Note  Marcus Pace is a 60 y.o. male admitted on 08/02/2015 with sepsis.  Pharmacy has been consulted for Vancomycin dosing. WBC 4.6, Tmax 100.8, CrCl ~3767mL/min. Pt received ceftriaxone and azithromycin x1 in the ED.   Plan: Vancomycin 1,000mg  IV x1 in the ED Vancomycin 750mg  IV Q12h  F/U c/s, renal fxn, LOT, VT @ss   Weight: 135 lb (61.236 kg)  Temp (24hrs), Avg:100.8 F (38.2 C), Min:100.8 F (38.2 C), Max:100.8 F (38.2 C)   Recent Labs Lab 08/01/15 1126 08/01/15 1649 08/01/15 2014 08/02/15 1709 08/02/15 1721  WBC 5.7  --   --  4.6  --   CREATININE 1.07  --   --  1.04  --   LATICACIDVEN  --  2.87* 2.36*  --  1.58    Estimated Creatinine Clearance: 67 mL/min (by C-G formula based on Cr of 1.04).    No Known Allergies  Antimicrobials this admission: 3/28 azithromycin x1 in the ED 3/28 ceftriaxone x1 in the ED 3/29 Vancomycin>>   Thank you for allowing pharmacy to be a part of this patient's care.  Marcus Pace, PharmD Pharmacy Resident  Pager: [phone] 08/02/2015 8:04 PM

## 2015-08-03 DIAGNOSIS — N50811 Right testicular pain: Secondary | ICD-10-CM

## 2015-08-03 DIAGNOSIS — A419 Sepsis, unspecified organism: Secondary | ICD-10-CM

## 2015-08-03 DIAGNOSIS — E440 Moderate protein-calorie malnutrition: Secondary | ICD-10-CM | POA: Insufficient documentation

## 2015-08-03 DIAGNOSIS — Z720 Tobacco use: Secondary | ICD-10-CM

## 2015-08-03 DIAGNOSIS — A499 Bacterial infection, unspecified: Secondary | ICD-10-CM

## 2015-08-03 LAB — BASIC METABOLIC PANEL
Anion gap: 7 (ref 5–15)
BUN: <5 mg/dL — ABNORMAL LOW (ref 6–20)
CALCIUM: 8.4 mg/dL — AB (ref 8.9–10.3)
CO2: 28 mmol/L (ref 22–32)
CREATININE: 1.02 mg/dL (ref 0.61–1.24)
Chloride: 106 mmol/L (ref 101–111)
GFR calc non Af Amer: >60 mL/min (ref >60)
Glucose, Bld: 115 mg/dL — ABNORMAL HIGH (ref 65–99)
Potassium: 4.8 mmol/L (ref 3.5–5.1)
SODIUM: 141 mmol/L (ref 135–145)

## 2015-08-03 LAB — URINE CULTURE: CULTURE: NO GROWTH

## 2015-08-03 LAB — GLUCOSE, CAPILLARY: GLUCOSE-CAPILLARY: 95 mg/dL (ref 65–99)

## 2015-08-03 MED ORDER — ENSURE ENLIVE PO LIQD
237.0000 mL | ORAL | Status: DC
  Administered 2015-08-03: 237 mL via ORAL

## 2015-08-03 NOTE — Progress Notes (Signed)
PATIENT DETAILS Name: Marcus Pace Age: 60 y.o. Sex: male Date of Birth: 03/14/1956 Admit Date: 08/02/2015 Admitting Physician Briscoe Deutscherimothy S Opyd, MD PCP:No primary care provider on file.  Subjective: Feels better. No fever. No back pain.No groin or scrotal pain  Assessment/Plan: Principal Problem: Influenza B:continue Tamiflu and other supportive measures.   Active Problems: Sepsis:sepsis pathophysiology has resolved, looks non toxic. Likely 2/2 Influenza.   Coag neg bacteremia:felt to be a contaminant-stop Vanco-await repeat cultures-if negative-suspect no further work up/treatment required   Scrotal Pain:seems to have resolved this am-Ultrasound negative for torsion-likely probably from herniated bowel. Stable for outpatient follow up with Gen Surgery  Tobacco abuse: Counseled toward cessation  Disposition: Remain inpatient-home 3/31 if repeat Blood cultures are negative.  Antimicrobial agents  See below  Anti-infectives    Start     Dose/Rate Route Frequency Ordered Stop   08/03/15 0830  vancomycin (VANCOCIN) IVPB 750 mg/150 ml premix  Status:  Discontinued     750 mg 150 mL/hr over 60 Minutes Intravenous Every 12 hours 08/02/15 2010 08/03/15 1258   08/02/15 2100  oseltamivir (TAMIFLU) capsule 75 mg     75 mg Oral Every 12 hours 08/02/15 1957 08/07/15 2059   08/02/15 2000  vancomycin (VANCOCIN) IVPB 1000 mg/200 mL premix     1,000 mg 200 mL/hr over 60 Minutes Intravenous  Once 08/02/15 1957 08/03/15 0015   08/02/15 1715  azithromycin (ZITHROMAX) 500 mg in dextrose 5 % 250 mL IVPB     500 mg 250 mL/hr over 60 Minutes Intravenous  Once 08/02/15 1700 08/02/15 1837   08/02/15 1715  cefTRIAXone (ROCEPHIN) 1 g in dextrose 5 % 50 mL IVPB  Status:  Discontinued     1 g 100 mL/hr over 30 Minutes Intravenous  Once 08/02/15 1704 08/02/15 1704   08/02/15 1715  cefTRIAXone (ROCEPHIN) 1 g in dextrose 5 % 50 mL IVPB  Status:  Discontinued     1 g 100 mL/hr  over 30 Minutes Intravenous Every 24 hours 08/02/15 1704 08/02/15 1958      DVT Prophylaxis: Prophylactic Lovenox   Code Status: Full code  Family Communication None at bedside  Procedures: none  CONSULTS:  None  Time spent 20 minutes-Greater than 50% of this time was spent in counseling, explanation of diagnosis, planning of further management, and coordination of care.  MEDICATIONS: Scheduled Meds: . enoxaparin (LOVENOX) injection  40 mg Subcutaneous Q24H  . nicotine  14 mg Transdermal Daily  . oseltamivir  75 mg Oral Q12H  . sodium chloride flush  3 mL Intravenous Q12H   Continuous Infusions:  PRN Meds:.acetaminophen **OR** acetaminophen, bisacodyl, HYDROcodone-acetaminophen, ondansetron **OR** ondansetron (ZOFRAN) IV, polyethylene glycol    PHYSICAL EXAM: Vital signs in last 24 hours: Filed Vitals:   08/02/15 2014 08/03/15 0145 08/03/15 0553 08/03/15 1210  BP: 122/81 119/74 127/77 118/71  Pulse: 94 75 89 90  Temp: 99.8 F (37.7 C) 99.4 F (37.4 C) 98.5 F (36.9 C) 98.9 F (37.2 C)  TempSrc: Oral Oral Oral Oral  Resp: 22 20 20 20   Height: 6\' 1"  (1.854 m)     Weight: 59.421 kg (131 lb)  59.24 kg (130 lb 9.6 oz)   SpO2: 98% 97% 96% 97%    Weight change:  Filed Weights   08/02/15 1827 08/02/15 2014 08/03/15 0553  Weight: 61.236 kg (135 lb) 59.421 kg (131 lb) 59.24 kg (130 lb 9.6 oz)  Body mass index is 17.23 kg/(m^2).   Gen Exam: Awake and alert with clear speech.   Neck: Supple, No JVD.   Chest: B/L Clear.   CVS: S1 S2 Regular, no murmurs.  Abdomen: soft, BS +, non tender, non distended.  Extremities: no edema, lower extremities warm to touch. Neurologic: Non Focal.   Skin: No Rash.   Wounds: N/A.   Intake/Output from previous day:  Intake/Output Summary (Last 24 hours) at 08/03/15 1259 Last data filed at 08/03/15 1200  Gross per 24 hour  Intake 2233.75 ml  Output   1770 ml  Net 463.75 ml     LAB RESULTS: CBC  Recent Labs Lab  08/01/15 1126 08/02/15 1709  WBC 5.7 4.6  HGB 17.0 16.5  HCT 48.6 48.0  PLT 204 179  MCV 87.1 86.3  MCH 30.5 29.7  MCHC 35.0 34.4  RDW 14.1 13.9  LYMPHSABS  --  0.9  MONOABS  --  0.6  EOSABS  --  0.0  BASOSABS  --  0.0    Chemistries   Recent Labs Lab 08/01/15 1126 08/02/15 1709 08/03/15 0510  NA 136 135 141  K 4.3 4.6 4.8  CL 101 101 106  CO2 25 23 28   GLUCOSE 113* 103* 115*  BUN 5* <5* <5*  CREATININE 1.07 1.04 1.02  CALCIUM 9.1 8.8* 8.4*    CBG:  Recent Labs Lab 08/03/15 0553  GLUCAP 95    GFR Estimated Creatinine Clearance: 66.1 mL/min (by C-G formula based on Cr of 1.02).  Coagulation profile  Recent Labs Lab 08/02/15 2020  INR 1.03    Cardiac Enzymes No results for input(s): CKMB, TROPONINI, MYOGLOBIN in the last 168 hours.  Invalid input(s): CK  Invalid input(s): POCBNP No results for input(s): DDIMER in the last 72 hours. No results for input(s): HGBA1C in the last 72 hours. No results for input(s): CHOL, HDL, LDLCALC, TRIG, CHOLHDL, LDLDIRECT in the last 72 hours. No results for input(s): TSH, T4TOTAL, T3FREE, THYROIDAB in the last 72 hours.  Invalid input(s): FREET3 No results for input(s): VITAMINB12, FOLATE, FERRITIN, TIBC, IRON, RETICCTPCT in the last 72 hours.  Recent Labs  08/01/15 1126  LIPASE 21    Urine Studies No results for input(s): UHGB, CRYS in the last 72 hours.  Invalid input(s): UACOL, UAPR, USPG, UPH, UTP, UGL, UKET, UBIL, UNIT, UROB, ULEU, UEPI, UWBC, URBC, UBAC, CAST, UCOM, BILUA  MICROBIOLOGY: Recent Results (from the past 240 hour(s))  Culture, blood (routine x 2)     Status: None (Preliminary result)   Collection Time: 08/01/15  4:30 PM  Result Value Ref Range Status   Specimen Description BLOOD RIGHT HAND  Final   Special Requests BOTTLES DRAWN AEROBIC AND ANAEROBIC 5CC  Final   Culture  Setup Time   Final    GRAM POSITIVE COCCI IN CLUSTERS ANAEROBIC BOTTLE ONLY CRITICAL RESULT CALLED TO, READ  BACK BY AND VERIFIED WITH: R CULLOM,RN AT 1216 08/02/15 BY L BENFIELD    Culture   Final    STAPHYLOCOCCUS SPECIES (COAGULASE NEGATIVE) THE SIGNIFICANCE OF ISOLATING THIS ORGANISM FROM A SINGLE SET OF BLOOD CULTURES WHEN MULTIPLE SETS ARE DRAWN IS UNCERTAIN. PLEASE NOTIFY THE MICROBIOLOGY DEPARTMENT WITHIN ONE WEEK IF SPECIATION AND SENSITIVITIES ARE REQUIRED.    Report Status PENDING  Incomplete  Culture, blood (routine x 2)     Status: None (Preliminary result)   Collection Time: 08/01/15  5:30 PM  Result Value Ref Range Status   Specimen Description BLOOD LEFT ANTECUBITAL  Final   Special Requests BOTTLES DRAWN AEROBIC AND ANAEROBIC 5CC  Final   Culture NO GROWTH < 24 HOURS  Final   Report Status PENDING  Incomplete    RADIOLOGY STUDIES/RESULTS: Dg Chest 2 View  08/01/2015  CLINICAL DATA:  Cough for 3 days EXAM: CHEST  2 VIEW COMPARISON:  None. FINDINGS: Allowing for nipple shadows, lungs are hyper aerated and clear. Normal heart size. No pneumothorax or pleural effusion. IMPRESSION: No active cardiopulmonary disease. Electronically Signed   By: Jolaine Click M.D.   On: 08/01/2015 16:47   US Scrotum  08/02/2015  CLINICAL DATA:  Acute onset of right-sided scrotal pain. Known right inguinal hernia. Initial encounter. EXAM: SCROTAL ULTRASOUND DOPPLER ULTRASOUND OF THE TESTICLES TECHNIQUE: Complete ultrasound examination of the testicles, epididymis, and other scrotal structures was performed. Color and spectral Doppler ultrasound were also utilized to evaluate blood flow to the testicles. COMPARISON:  None. FINDINGS: Right testicle Measurements: 4.4 x 2.1 x 3.1 cm. No mass or microlithiasis visualized. Left testicle Measurements: 4.0 x 2.5 x 2.9 cm. No mass or microlithiasis visualized. Right epididymis:  Normal in size and appearance. Left epididymis:  Normal in size and appearance. Hydrocele:  A small left-sided hydrocele is noted. Varicocele:  None visualized. Pulsed Doppler interrogation of  both testes demonstrates normal low resistance arterial and venous waveforms bilaterally. Note is made of prominent peristalsing bowel superior to the right testis, within the scrotum, reflecting the patient's known right inguinal hernia. There is no evidence for bowel obstruction. IMPRESSION: 1. Prominent peristalsing bowel superior to the right testis, reflecting the patient's known right inguinal hernia. No evidence for bowel obstruction at this time. 2. No evidence of testicular torsion. 3. Small left-sided hydrocele noted. Electronically Signed   By: Roanna Raider M.D.   On: 08/02/2015 21:27   [place] Art/ven Flow Abd Pelv Doppler  08/02/2015  CLINICAL DATA:  Acute onset of right-sided scrotal pain. Known right inguinal hernia. Initial encounter. EXAM: SCROTAL ULTRASOUND DOPPLER ULTRASOUND OF THE TESTICLES TECHNIQUE: Complete ultrasound examination of the testicles, epididymis, and other scrotal structures was performed. Color and spectral Doppler ultrasound were also utilized to evaluate blood flow to the testicles. COMPARISON:  None. FINDINGS: Right testicle Measurements: 4.4 x 2.1 x 3.1 cm. No mass or microlithiasis visualized. Left testicle Measurements: 4.0 x 2.5 x 2.9 cm. No mass or microlithiasis visualized. Right epididymis:  Normal in size and appearance. Left epididymis:  Normal in size and appearance. Hydrocele:  A small left-sided hydrocele is noted. Varicocele:  None visualized. Pulsed Doppler interrogation of both testes demonstrates normal low resistance arterial and venous waveforms bilaterally. Note is made of prominent peristalsing bowel superior to the right testis, within the scrotum, reflecting the patient's known right inguinal hernia. There is no evidence for bowel obstruction. IMPRESSION: 1. Prominent peristalsing bowel superior to the right testis, reflecting the patient's known right inguinal hernia. No evidence for bowel obstruction at this time. 2. No evidence of testicular torsion.  3. Small left-sided hydrocele noted. Electronically Signed   By: Roanna Raider M.D.   On: 08/02/2015 21:27   Dg Chest Port 1 View  08/02/2015  CLINICAL DATA:  Cough for 4 days with mild shortness of breath EXAM: PORTABLE CHEST 1 VIEW COMPARISON:  August 01, 2015 FINDINGS: There is no edema or consolidation. The heart size and pulmonary vascularity are normal. No adenopathy. There is degenerative change in the thoracic spine. IMPRESSION: No edema or consolidation. Electronically Signed   By: Bretta Bang III M.D.  On: 08/02/2015 17:18    Jeoffrey Massed, MD  [facility] Pager:[phone]  If 7PM-7AM, please contact night-coverage [email or web address] Password [email or web address] 08/03/2015, 12:59 PM   LOS: 1 day

## 2015-08-03 NOTE — Progress Notes (Signed)
Initial Nutrition Assessment  DOCUMENTATION CODES:   Underweight, Non-severe (moderate) malnutrition in context of acute illness/injury  INTERVENTION:  Ensure Enlive daily, 350 kcal, 20 grams of protein per bottle  NUTRITION DIAGNOSIS:   Inadequate oral intake related to acute illness as evidenced by per patient/family report.  GOAL:   Patient will meet greater than or equal to 90% of their needs  MONITOR:   PO intake, Supplement acceptance, Labs, Weight trends  REASON FOR ASSESSMENT:    (Underweight BMI)    ASSESSMENT:   60 y.o. male with PMH of tobacco abuse and back pain who presented to the ED yesterday afternoon for evaluation of fevers and cough, was diagnosed with influenza and discharged home with Tamiflu, but called back to the ED today for evaluation of a positive blood culture.  Pt seen for underweight BMI. Pt states his appetite was poor  PTA, reports today his appetite it is improving. Pt was a agreeable to Ensure Enlive daily to provide extra calories and protein, pt prefers chocolate.   Pt states he has no recent weight loss. Pt did not know his usual body weight. Not enough history per chart to determine weight loss. Pt denies any changes in his appearance, such as more prominent collar bone.   Conducted nutrition focused physical exam, identified severe muscle wasting, moderate fat wasting, no edema present  Medications reviewed. Labs reviewed.  Diet Order:  Diet regular Room service appropriate?: Yes; Fluid consistency:: Thin  Skin:  Reviewed, no issues  Last BM:  08/02/2015  Height:   Ht Readings from Last 1 Encounters:  08/02/15 6\' 1"  (1.854 m)    Weight:   Wt Readings from Last 1 Encounters:  08/03/15 130 lb 9.6 oz (59.24 kg)    Ideal Body Weight:  83.6 kg  BMI:  Body mass index is 17.23 kg/(m^2).  Estimated Nutritional Needs:   Kcal:  2000-2200  Protein:  100-110 grams (1.2 g/IBW)   Fluid:  >/= 2 L   EDUCATION NEEDS:   No  education needs identified at this time  SwedenBrittany Franceen Erisman, Dietetic Intern Pager: [phone]

## 2015-08-03 NOTE — Progress Notes (Signed)
Admitted pt to rm 3E13 from ED, pt alert and oriented, denied pain at this time, oriented to room, call bell placed within reach, orders carried out. Filed Vitals:   08/02/15 2014 08/03/15 0145  BP: 122/81 119/74  Pulse: 94 75  Temp: 99.8 F (37.7 C) 99.4 F (37.4 C)  Resp: 22 20

## 2015-08-03 NOTE — Plan of Care (Signed)
Problem: Fluid Volume: Goal: Ability to maintain a balanced intake and output will improve Outcome: Progressing Patient states his appetite has increase. Patient also started in ensure today and is tolerating it well.

## 2015-08-04 DIAGNOSIS — E440 Moderate protein-calorie malnutrition: Secondary | ICD-10-CM

## 2015-08-04 LAB — CULTURE, BLOOD (ROUTINE X 2)

## 2015-08-04 LAB — GLUCOSE, CAPILLARY: Glucose-Capillary: 107 mg/dL — ABNORMAL HIGH (ref 65–99)

## 2015-08-04 MED ORDER — OSELTAMIVIR PHOSPHATE 75 MG PO CAPS: 75.0000 mg | ORAL_CAPSULE | Freq: Two times a day (BID) | ORAL | Status: DC

## 2015-08-04 MED ORDER — ENSURE ENLIVE PO LIQD: 237.0000 mL | ORAL | Status: DC

## 2015-08-04 NOTE — Progress Notes (Signed)
Patient given discharge instructions and all questions answered.  Patient discharged via wheelchair with all belongings.   

## 2015-08-04 NOTE — Discharge Summary (Addendum)
PATIENT DETAILS Name: Marcus Pace Age: 60 y.o. Sex: male Date of Birth: 01/03/1956 MRN: [id number]. Admitting Physician: Briscoe Deutscher, MD PCP:No primary care provider on file.  Admit Date: 08/02/2015 Discharge date: 08/04/2015  Recommendations for Outpatient Follow-up:  1. Refer to gen surgery for evaluation of repair of inguinal hernia.  2. Please repeat CBC/BMET at next visit 3. Please follow blood cultures till final  PRIMARY DISCHARGE DIAGNOSIS:  Principal Problem:   Bacteremia due to Gram-positive bacteria Active Problems:   Influenza B   Sepsis (HCC)   Tobacco abuse   Testicular pain, right   Malnutrition of moderate degree      PAST MEDICAL HISTORY: History reviewed. No pertinent past medical history.  DISCHARGE MEDICATIONS: Current Discharge Medication List    START taking these medications   Details  feeding supplement, ENSURE ENLIVE, (ENSURE ENLIVE) LIQD Take 237 mLs by mouth daily. Qty: 30 Bottle, Refills: 0      CONTINUE these medications which have CHANGED   Details  oseltamivir (TAMIFLU) 75 MG capsule Take 1 capsule (75 mg total) by mouth every 12 (twelve) hours. Take 6 more doses and then stop      STOP taking these medications     traMADol (ULTRAM) 50 MG tablet         ALLERGIES:  No Known Allergies  BRIEF HPI:  See H&P, Labs, Consult and Test reports for all details in brief,Marcus Pace is a 60 y.o. male with PMH of tobacco abuse and back pain who presented to the ED 1 day before this admission- for evaluation of fevers and cough, was diagnosed with influenza and discharged home with Tamiflu, but called back to the ED today for evaluation of a positive blood culture  CONSULTATIONS:   None  PERTINENT RADIOLOGIC STUDIES: Dg Chest 2 View  08/01/2015  CLINICAL DATA:  Cough for 3 days EXAM: CHEST  2 VIEW COMPARISON:  None. FINDINGS: Allowing for nipple shadows, lungs are hyper aerated and clear. Normal heart size. No  pneumothorax or pleural effusion. IMPRESSION: No active cardiopulmonary disease. Electronically Signed   By: Jolaine Click M.D.   On: 08/01/2015 16:47   US Scrotum  08/02/2015  CLINICAL DATA:  Acute onset of right-sided scrotal pain. Known right inguinal hernia. Initial encounter. EXAM: SCROTAL ULTRASOUND DOPPLER ULTRASOUND OF THE TESTICLES TECHNIQUE: Complete ultrasound examination of the testicles, epididymis, and other scrotal structures was performed. Color and spectral Doppler ultrasound were also utilized to evaluate blood flow to the testicles. COMPARISON:  None. FINDINGS: Right testicle Measurements: 4.4 x 2.1 x 3.1 cm. No mass or microlithiasis visualized. Left testicle Measurements: 4.0 x 2.5 x 2.9 cm. No mass or microlithiasis visualized. Right epididymis:  Normal in size and appearance. Left epididymis:  Normal in size and appearance. Hydrocele:  A small left-sided hydrocele is noted. Varicocele:  None visualized. Pulsed Doppler interrogation of both testes demonstrates normal low resistance arterial and venous waveforms bilaterally. Note is made of prominent peristalsing bowel superior to the right testis, within the scrotum, reflecting the patient's known right inguinal hernia. There is no evidence for bowel obstruction. IMPRESSION: 1. Prominent peristalsing bowel superior to the right testis, reflecting the patient's known right inguinal hernia. No evidence for bowel obstruction at this time. 2. No evidence of testicular torsion. 3. Small left-sided hydrocele noted. Electronically Signed   By: Roanna Raider M.D.   On: 08/02/2015 21:27   [place] Art/ven Flow Abd Pelv Doppler  08/02/2015  CLINICAL DATA:  Acute onset of  right-sided scrotal pain. Known right inguinal hernia. Initial encounter. EXAM: SCROTAL ULTRASOUND DOPPLER ULTRASOUND OF THE TESTICLES TECHNIQUE: Complete ultrasound examination of the testicles, epididymis, and other scrotal structures was performed. Color and spectral Doppler  ultrasound were also utilized to evaluate blood flow to the testicles. COMPARISON:  None. FINDINGS: Right testicle Measurements: 4.4 x 2.1 x 3.1 cm. No mass or microlithiasis visualized. Left testicle Measurements: 4.0 x 2.5 x 2.9 cm. No mass or microlithiasis visualized. Right epididymis:  Normal in size and appearance. Left epididymis:  Normal in size and appearance. Hydrocele:  A small left-sided hydrocele is noted. Varicocele:  None visualized. Pulsed Doppler interrogation of both testes demonstrates normal low resistance arterial and venous waveforms bilaterally. Note is made of prominent peristalsing bowel superior to the right testis, within the scrotum, reflecting the patient's known right inguinal hernia. There is no evidence for bowel obstruction. IMPRESSION: 1. Prominent peristalsing bowel superior to the right testis, reflecting the patient's known right inguinal hernia. No evidence for bowel obstruction at this time. 2. No evidence of testicular torsion. 3. Small left-sided hydrocele noted. Electronically Signed   By: Roanna Raider M.D.   On: 08/02/2015 21:27   Dg Chest Port 1 View  08/02/2015  CLINICAL DATA:  Cough for 4 days with mild shortness of breath EXAM: PORTABLE CHEST 1 VIEW COMPARISON:  August 01, 2015 FINDINGS: There is no edema or consolidation. The heart size and pulmonary vascularity are normal. No adenopathy. There is degenerative change in the thoracic spine. IMPRESSION: No edema or consolidation. Electronically Signed   By: Bretta Bang III M.D.   On: 08/02/2015 17:18     PERTINENT LAB RESULTS: CBC:  Recent Labs  08/01/15 1126 08/02/15 1709  WBC 5.7 4.6  HGB 17.0 16.5  HCT 48.6 48.0  PLT 204 179   CMET CMP     Component Value Date/Time   NA 141 08/03/2015 0510   K 4.8 08/03/2015 0510   CL 106 08/03/2015 0510   CO2 28 08/03/2015 0510   GLUCOSE 115* 08/03/2015 0510   BUN <5* 08/03/2015 0510   CREATININE 1.02 08/03/2015 0510   CALCIUM 8.4* 08/03/2015 0510    PROT 6.8 08/02/2015 1709   ALBUMIN 3.5 08/02/2015 1709   AST 74* 08/02/2015 1709   ALT 46 08/02/2015 1709   ALKPHOS 63 08/02/2015 1709   BILITOT 0.8 08/02/2015 1709   GFRNONAA >60 08/03/2015 0510   GFRAA >60 08/03/2015 0510    GFR Estimated Creatinine Clearance: 64.5 mL/min (by C-G formula based on Cr of 1.02).  Recent Labs  08/01/15 1126  LIPASE 21   No results for input(s): CKTOTAL, CKMB, CKMBINDEX, TROPONINI in the last 72 hours. Invalid input(s): POCBNP No results for input(s): DDIMER in the last 72 hours. No results for input(s): HGBA1C in the last 72 hours. No results for input(s): CHOL, HDL, LDLCALC, TRIG, CHOLHDL, LDLDIRECT in the last 72 hours. No results for input(s): TSH, T4TOTAL, T3FREE, THYROIDAB in the last 72 hours.  Invalid input(s): FREET3 No results for input(s): VITAMINB12, FOLATE, FERRITIN, TIBC, IRON, RETICCTPCT in the last 72 hours. Coags:  Recent Labs  08/02/15 2020  INR 1.03   Microbiology: Recent Results (from the past 240 hour(s))  Culture, blood (routine x 2)     Status: None   Collection Time: 08/01/15  4:30 PM  Result Value Ref Range Status   Specimen Description BLOOD RIGHT HAND  Final   Special Requests BOTTLES DRAWN AEROBIC AND ANAEROBIC 5CC  Final   Culture  Setup Time   Final    GRAM POSITIVE COCCI IN CLUSTERS ANAEROBIC BOTTLE ONLY CRITICAL RESULT CALLED TO, READ BACK BY AND VERIFIED WITH: R CULLOM,RN AT 1216 08/02/15 BY L BENFIELD    Culture   Final    STAPHYLOCOCCUS SPECIES (COAGULASE NEGATIVE) THE SIGNIFICANCE OF ISOLATING THIS ORGANISM FROM A SINGLE SET OF BLOOD CULTURES WHEN MULTIPLE SETS ARE DRAWN IS UNCERTAIN. PLEASE NOTIFY THE MICROBIOLOGY DEPARTMENT WITHIN ONE WEEK IF SPECIATION AND SENSITIVITIES ARE REQUIRED.    Report Status 08/04/2015 FINAL  Final  Culture, blood (routine x 2)     Status: None (Preliminary result)   Collection Time: 08/01/15  5:30 PM  Result Value Ref Range Status   Specimen Description BLOOD LEFT  ANTECUBITAL  Final   Special Requests BOTTLES DRAWN AEROBIC AND ANAEROBIC 5CC  Final   Culture NO GROWTH 2 DAYS  Final   Report Status PENDING  Incomplete  Blood Culture (routine x 2)     Status: None (Preliminary result)   Collection Time: 08/02/15  5:09 PM  Result Value Ref Range Status   Specimen Description BLOOD RIGHT FOREARM  Final   Special Requests BOTTLES DRAWN AEROBIC AND ANAEROBIC 5CC  Final   Culture NO GROWTH < 24 HOURS  Final   Report Status PENDING  Incomplete  Blood Culture (routine x 2)     Status: None (Preliminary result)   Collection Time: 08/02/15  5:36 PM  Result Value Ref Range Status   Specimen Description BLOOD LEFT ANTECUBITAL  Final   Special Requests BOTTLES DRAWN AEROBIC AND ANAEROBIC 10CC  Final   Culture NO GROWTH < 24 HOURS  Final   Report Status PENDING  Incomplete  Urine culture     Status: None   Collection Time: 08/02/15  6:41 PM  Result Value Ref Range Status   Specimen Description URINE, CLEAN CATCH  Final   Special Requests NONE  Final   Culture NO GROWTH 1 DAY  Final   Report Status 08/03/2015 FINAL  Final     BRIEF HOSPITAL COURSE:  Influenza B:continue Tamiflu for 6 more doses. Much improved, afebrile, minimal cough, no SOB. Stable for discharge today  Active Problems: Sepsis:sepsis pathophysiology rapidly resolved with IVF and Tamiflu, looks non toxic. Likely 2/2 Influenza.   Coag neg bacteremia:felt to be a contaminant-briefly on IV Vanco that was discontinued on 3/20. Repeat blood cultures on 3/29 are negative so far. Please follow blood cultures till final.  Right Inguinal Hernia:did have some Right Scrotal Pain on admission-nonce since then. Has a right sided inguinal for the past 1 year, hernia is easily reducible with no signs of incarceration. Testicular Ultrasound negative for torsion-likely probably from herniated bowel. Stable for outpatient follow up with Gen Surgery  Tobacco abuse: Counseled toward cessation  Moderate  Protein Calorie Nutrition: continue supplements   TODAY-DAY OF DISCHARGE:  Subjective:   Kaushal Vannice today has no headache,no chest abdominal pain,no new weakness tingling or numbness, feels much better wants to go home today.   Objective:   Blood pressure 119/76, pulse 85, temperature 98.3 F (36.8 C), temperature source Oral, resp. rate 16, height  (1.854 m), weight 57.834 kg (127 lb 8 oz), SpO2 95 %.  Intake/Output Summary (Last 24 hours) at 08/04/15 1039 Last data filed at 08/04/15 0854  Gross per 24 hour  Intake   1230 ml  Output    750 ml  Net    480 ml   Filed Weights   08/02/15 2014 08/03/15 0553  08/04/15 0531  Weight: 59.421 kg (131 lb) 59.24 kg (130 lb 9.6 oz) 57.834 kg (127 lb 8 oz)    Exam Awake Alert, Oriented *3, No new F.N deficits, Normal affect [place].AT,PERRAL Supple Neck,No JVD, No cervical lymphadenopathy appriciated.  Symmetrical Chest wall movement, Good air movement bilaterally, CTAB RRR,No Gallops,Rubs or new Murmurs, No Parasternal Heave +ve B.Sounds, Abd Soft, Non tender, No organomegaly appriciated, No rebound -guarding or rigidity. No Cyanosis, Clubbing or edema, No new Rash or bruise  DISCHARGE CONDITION: Stable  DISPOSITION: Home  DISCHARGE INSTRUCTIONS:    Activity:  As tolerated   Get Medicines reviewed and adjusted: Please take all your medications with you for your next visit with your Primary MD  Please request your Primary MD to go over all hospital tests and procedure/radiological results at the follow up, please ask your Primary MD to get all Hospital records sent to his/her office.  If you experience worsening of your admission symptoms, develop shortness of breath, life threatening emergency, suicidal or homicidal thoughts you must seek medical attention immediately by calling 911 or calling your MD immediately  if symptoms less severe.  You must read complete instructions/literature along with all the possible adverse  reactions/side effects for all the Medicines you take and that have been prescribed to you. Take any new Medicines after you have completely understood and accpet all the possible adverse reactions/side effects.   Do not drive when taking Pain medications.   Do not take more than prescribed Pain, Sleep and Anxiety Medications  Special Instructions: If you have smoked or chewed Tobacco  in the last 2 yrs please stop smoking, stop any regular Alcohol  and or any Recreational drug use.  Wear Seat belts while driving.  Please note  You were cared for by a hospitalist during your hospital stay. Once you are discharged, your primary care physician will handle any further medical issues. Please note that NO REFILLS for any discharge medications will be authorized once you are discharged, as it is imperative that you return to your primary care physician (or establish a relationship with a primary care physician if you do not have one) for your aftercare needs so that they can reassess your need for medications and monitor your lab values.  Diet recommendation: Regular Diet  Discharge Instructions    Call MD for:  redness, tenderness, or signs of infection (pain, swelling, redness, odor or green/yellow discharge around incision site)    Complete by:  As directed      Call MD for:  severe uncontrolled pain    Complete by:  As directed      Diet general    Complete by:  As directed      Increase activity slowly    Complete by:  As directed            Follow-up Information    Follow up with  [facility] On 08/25/2015.   Specialty:  Internal Medicine   Why:  At 2:00pm    Contact information:   [address] [place] [id number] [phone]      Schedule an appointment as soon as possible for a visit with [facility].   Specialty:  General Surgery   Why:  for evaluation for correct right inguinal hernia.   Contact information:   [address] [place] [id number] [phone]      Total Time spent on discharge equals 25  minutes.  SignedJeoffrey Massed 08/04/2015 10:39 AM

## 2015-08-05 ENCOUNTER — Telehealth (HOSPITAL_BASED_OUTPATIENT_CLINIC_OR_DEPARTMENT_OTHER): Payer: Self-pay | Admitting: Emergency Medicine

## 2015-08-05 NOTE — Telephone Encounter (Signed)
Post ED Visit - Positive Culture Follow-up  Culture report reviewed by antimicrobial stewardship pharmacist:  []  Marcus Pace, Pharm.D. []  Marcus Pace, Pharm.D., BCPS []  Marcus Pace, Pharm.D. []  Marcus Pace, Marcus Rainbow BoulevardPharm.D., BCPS []  Marcus Pace, Marcus Rainbow BoulevardPharm.D., BCPS, AAHIVP []  Marcus Pace, Pharm.D., BCPS, AAHIVP []  Marcus Pace, Pharm.D. []  Marcus Pace, Marcus Pace PharmD  Positive blood culture Patient was admitted but discharged 3/31. Repeat blood cultures negative  Marcus Pace, Marcus Pace 08/05/2015, 3:14 PM

## 2015-08-06 LAB — CULTURE, BLOOD (ROUTINE X 2): CULTURE: NO GROWTH

## 2015-08-07 LAB — CULTURE, BLOOD (ROUTINE X 2)
CULTURE: NO GROWTH
CULTURE: NO GROWTH

## 2015-08-25 ENCOUNTER — Ambulatory Visit: Payer: Self-pay | Admitting: Family Medicine

## 2016-09-09 IMAGING — US US ART/VEN ABD/PELV/SCROTUM DOPPLER LTD
1 series · 13 of 25 positions shown · non-contrast
Comparison: None.

CLINICAL DATA: Acute onset of right-sided scrotal pain. Known right
inguinal hernia. Initial encounter.

EXAM:
SCROTAL ULTRASOUND
DOPPLER ULTRASOUND OF THE TESTICLES
TECHNIQUE: Complete ultrasound examination of the testicles, epididymis, and
other scrotal structures was performed. Color and spectral Doppler
ultrasound were also utilized to evaluate blood flow to the
testicles.

[Series 1: us art/ven abd/pelv/scrotum doppler ltd · 0.08mm/px · 13 of 33 slices shown]
[im 1/33]
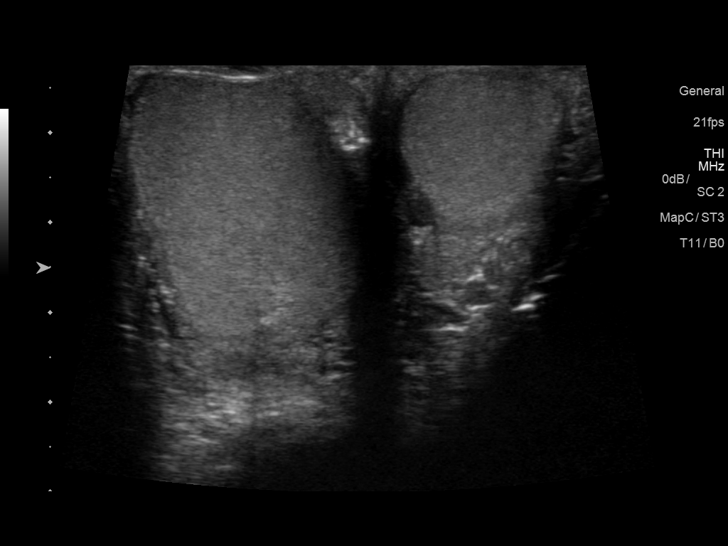
[im 3/33]
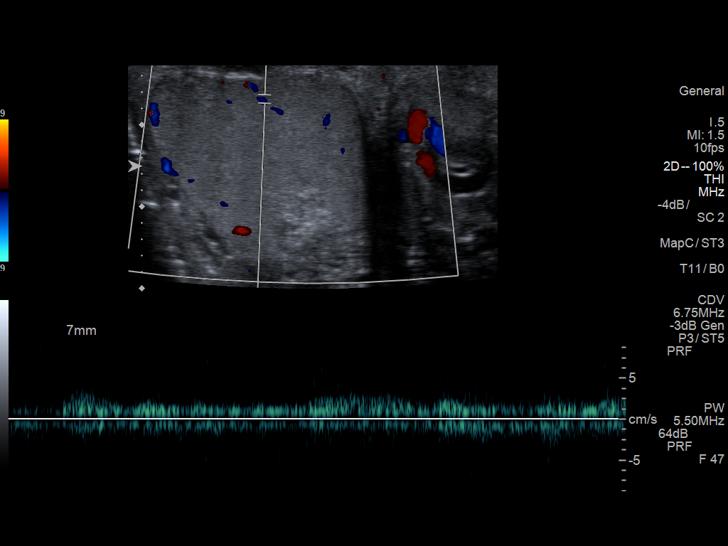
[im 6/33]
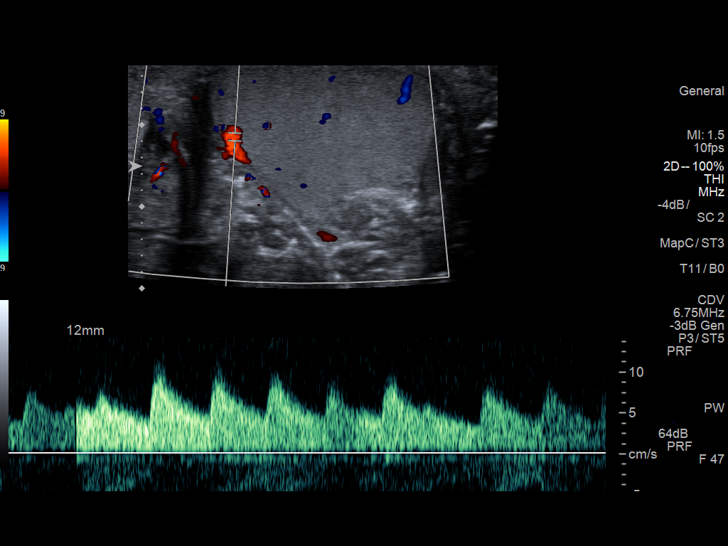
[im 9/33]
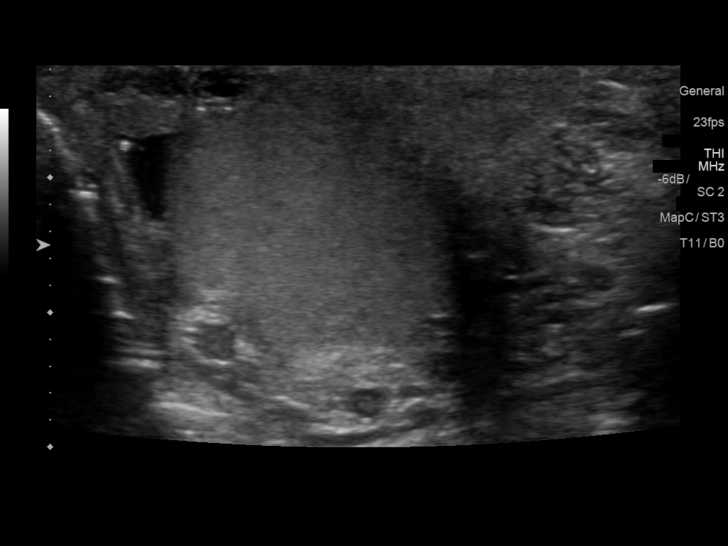
[im 11/33]
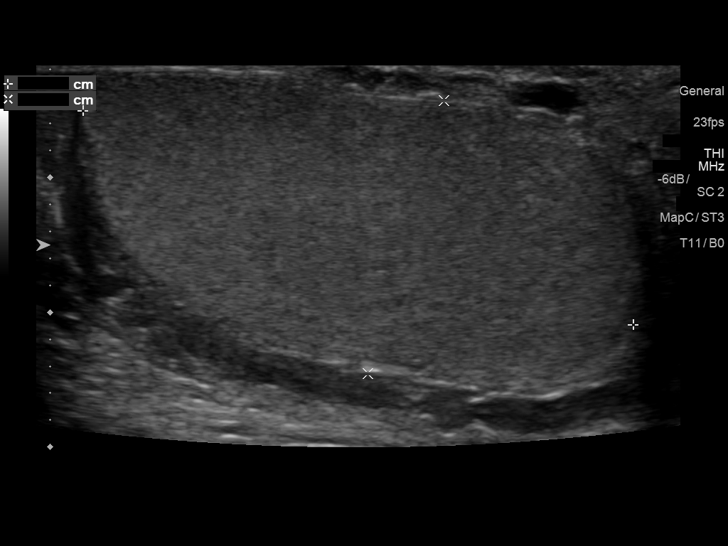
[im 14/33]
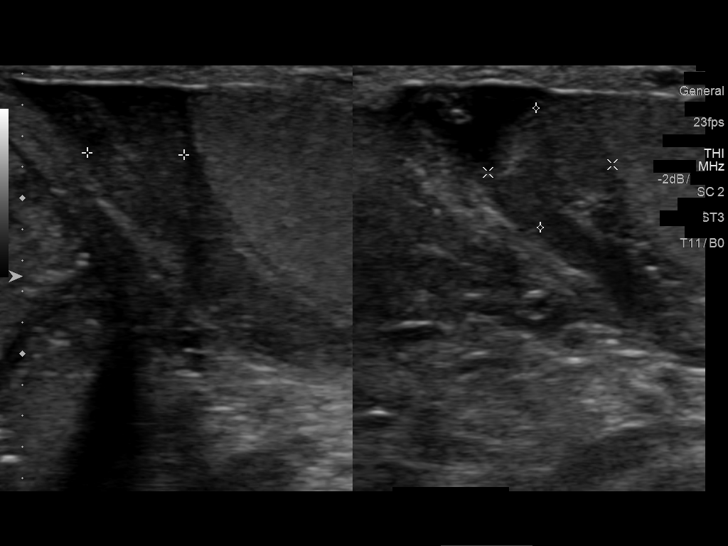
[im 17/33]
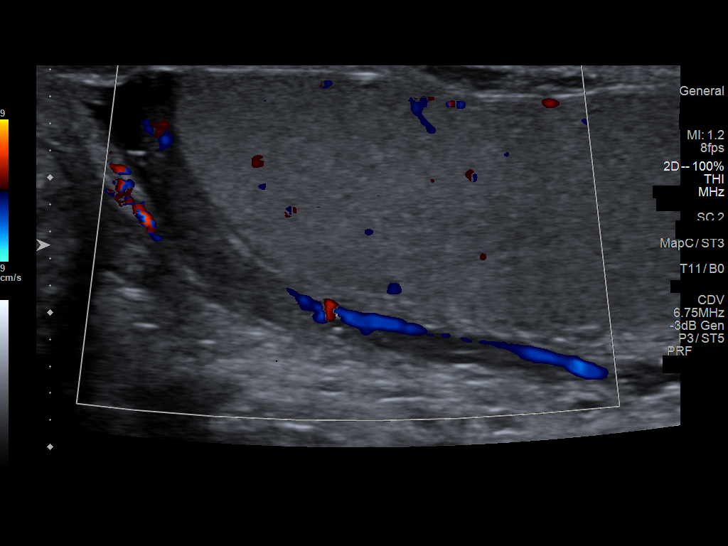
[im 19/33]
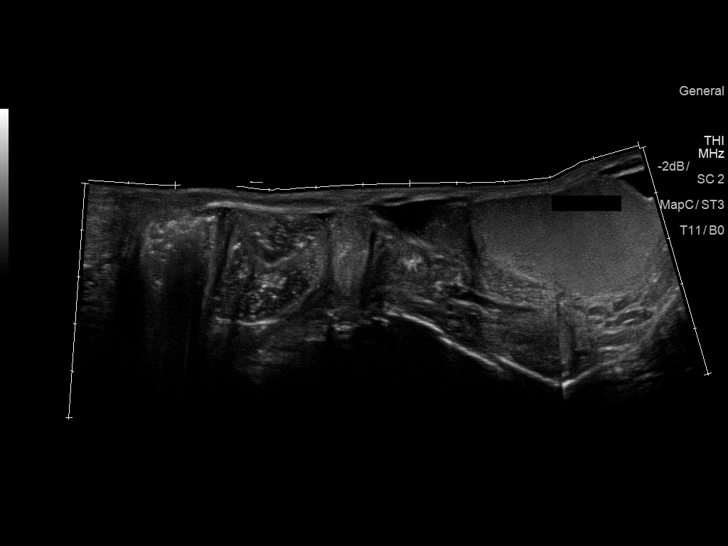
[im 22/33]
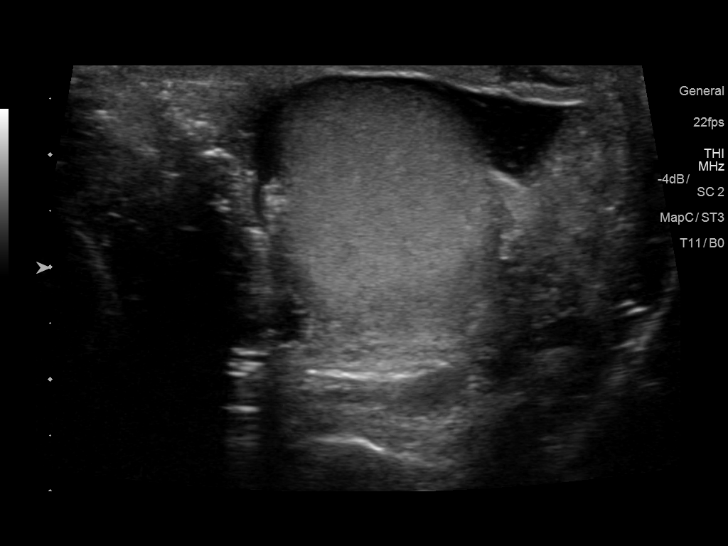
[im 25/33]
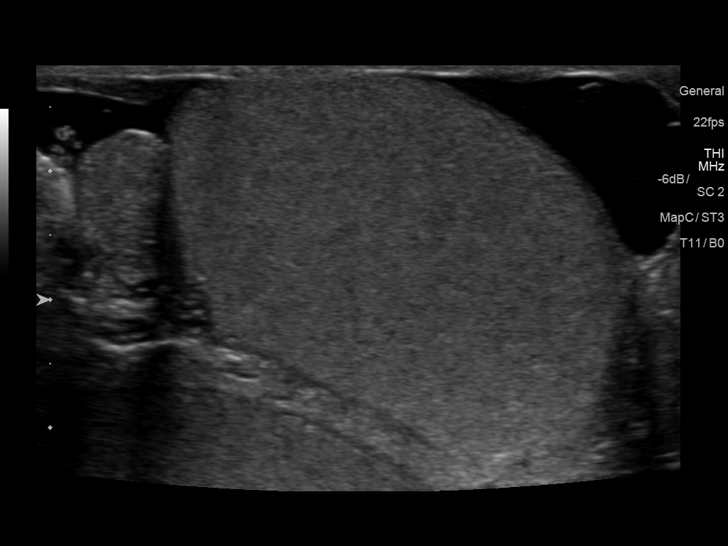
[im 27/33]
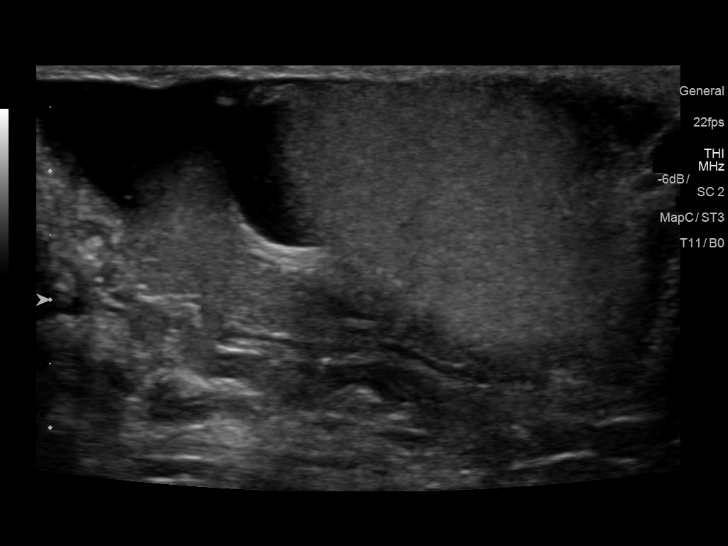
[im 30/33]
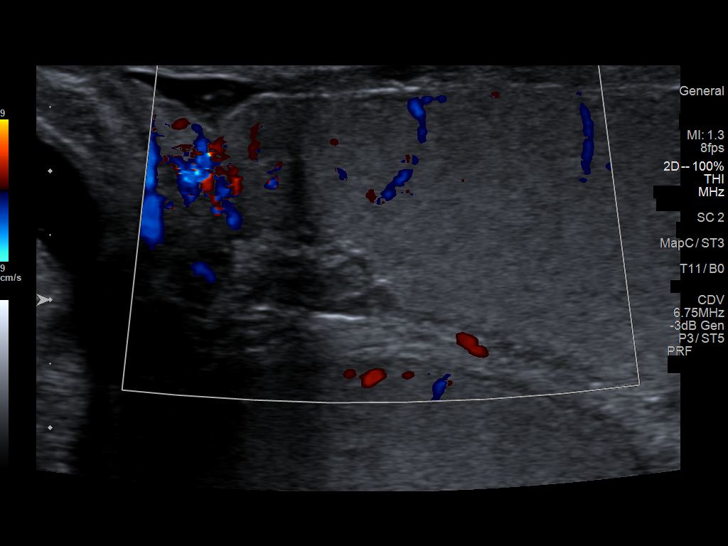
[im 33/33]
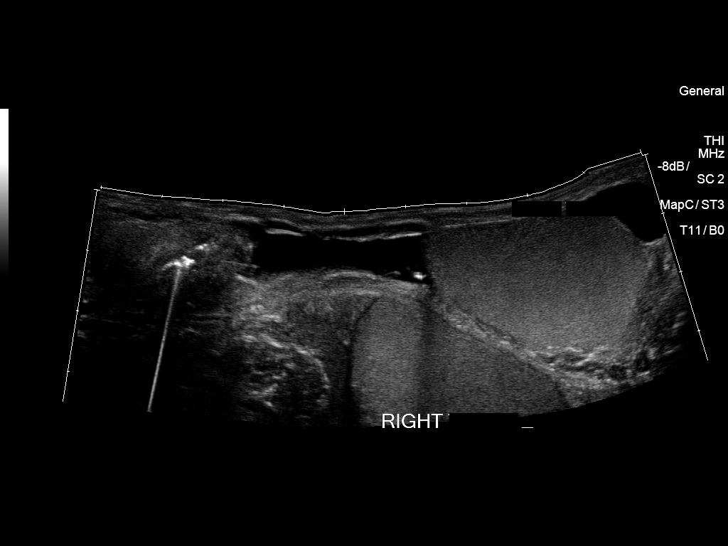

[13 of 25 positions shown; findings below may reference images not displayed]

FINDINGS: Right testicle

Measurements: 4.4 x 2.1 x 3.1 cm. No mass or microlithiasis
visualized.

Left testicle

Measurements: 4.0 x 2.5 x 2.9 cm. No mass or microlithiasis
visualized.

Right epididymis:  Normal in size and appearance.

Left epididymis:  Normal in size and appearance.

Hydrocele:  A small left-sided hydrocele is noted.

Varicocele:  None visualized.

Pulsed Doppler interrogation of both testes demonstrates normal low
resistance arterial and venous waveforms bilaterally.

Note is made of prominent peristalsing bowel superior to the right
testis, within the scrotum, reflecting the patient's known right
inguinal hernia. There is no evidence for bowel obstruction.
IMPRESSION: 1. Prominent peristalsing bowel superior to the right testis,
reflecting the patient's known right inguinal hernia. No evidence
for bowel obstruction at this time.
2. No evidence of testicular torsion.
3. Small left-sided hydrocele noted.

## 2017-04-20 IMAGING — CR DG CHEST 1V PORT
1 series · 1 of 1 positions shown · non-contrast
Comparison: August 01, 2015

CLINICAL DATA: Cough for 4 days with mild shortness of breath

EXAM:
PORTABLE CHEST 1 VIEW

[AP]
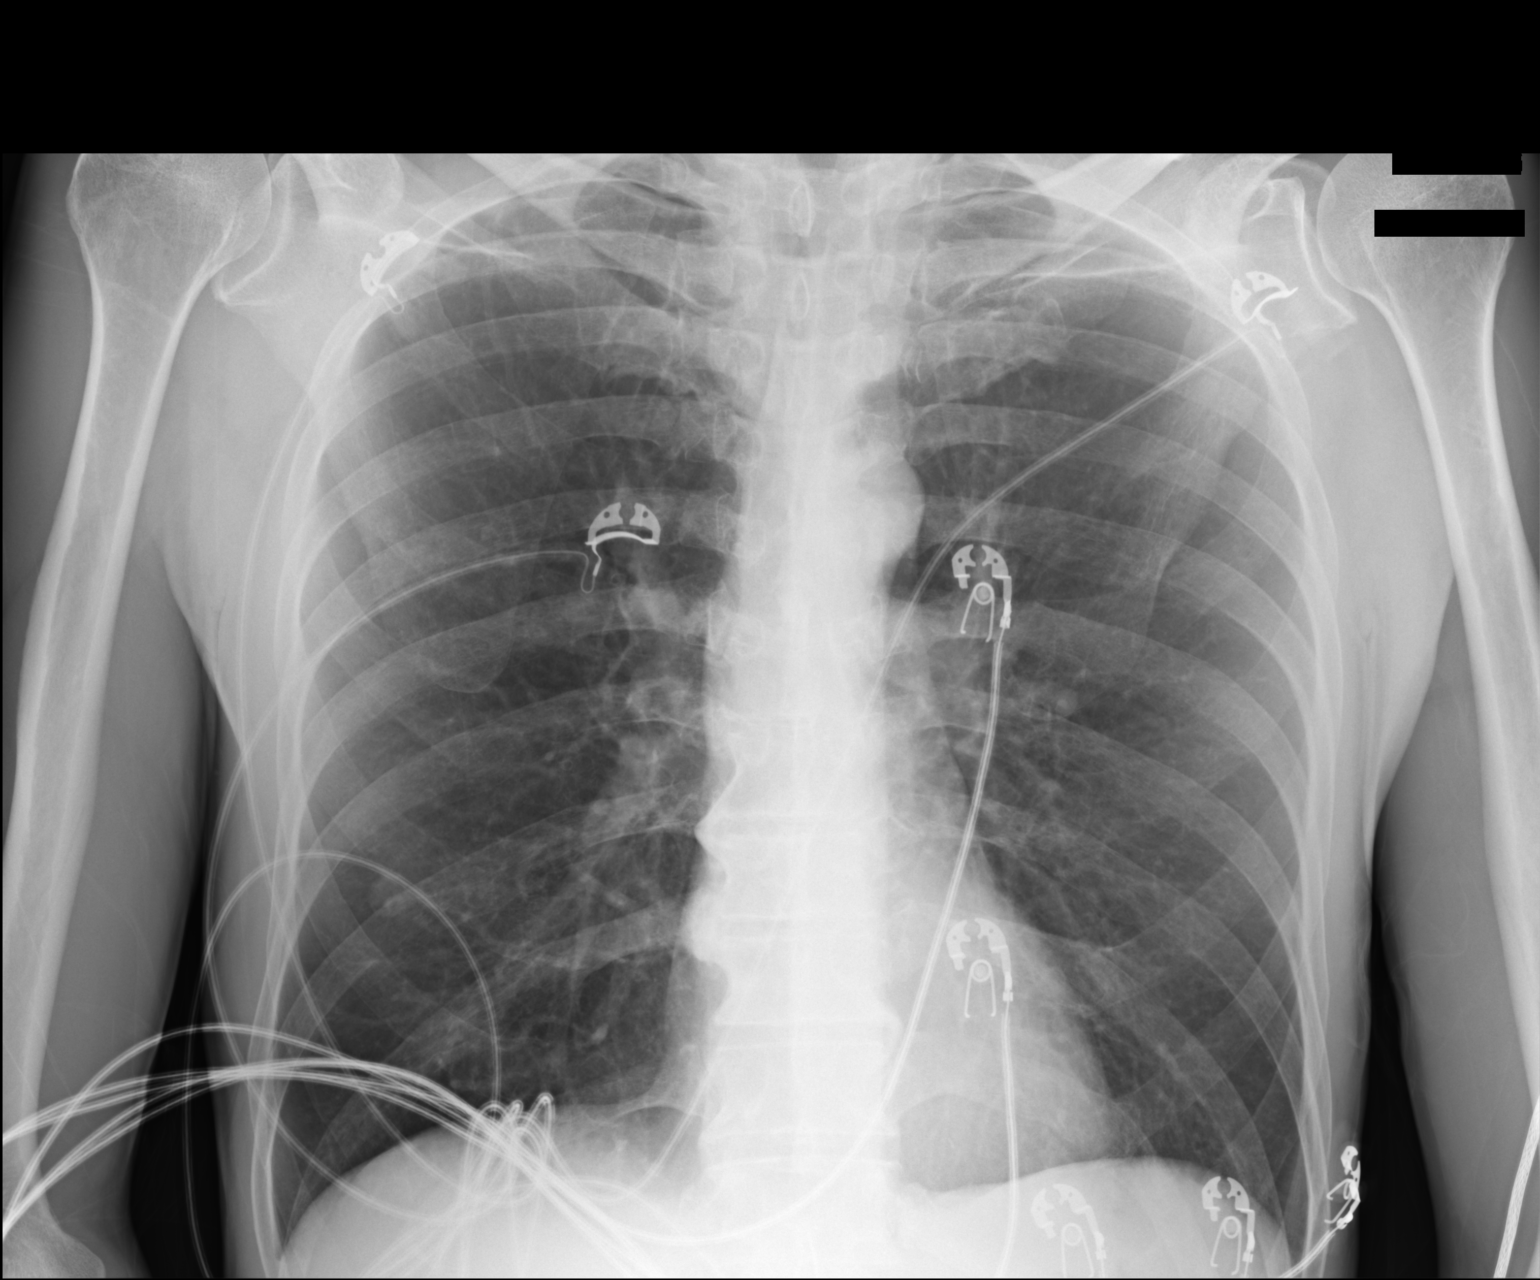

[1 of 1 positions shown; findings below may reference images not displayed]

FINDINGS: There is no edema or consolidation. The heart size and pulmonary
vascularity are normal. No adenopathy. There is degenerative change
in the thoracic spine.
IMPRESSION: No edema or consolidation.

## 2017-09-02 ENCOUNTER — Emergency Department (HOSPITAL_COMMUNITY)
Admission: EM | Admit: 2017-09-02 | Discharge: 2017-09-02 | Disposition: A | Discharge status: Home / Self Care | Payer: Self-pay | Attending: Emergency Medicine | Admitting: Emergency Medicine

## 2017-09-02 ENCOUNTER — Other Ambulatory Visit: Payer: Self-pay

## 2017-09-02 ENCOUNTER — Encounter (HOSPITAL_COMMUNITY): Payer: Self-pay

## 2017-09-02 DIAGNOSIS — F17200 Nicotine dependence, unspecified, uncomplicated: Secondary | ICD-10-CM | POA: Insufficient documentation

## 2017-09-02 DIAGNOSIS — Z79899 Other long term (current) drug therapy: Secondary | ICD-10-CM | POA: Insufficient documentation

## 2017-09-02 DIAGNOSIS — K4090 Unilateral inguinal hernia, without obstruction or gangrene, not specified as recurrent: Secondary | ICD-10-CM | POA: Insufficient documentation

## 2017-09-02 NOTE — Discharge Instructions (Addendum)
Please read attached information. If you experience any new or worsening signs or symptoms please return to the emergency room for evaluation. Please follow-up with your primary care provider or specialist as discussed.  °

## 2017-09-02 NOTE — ED Provider Notes (Signed)
[facility] EMERGENCY DEPARTMENT Provider Note   CSN: [id number] Arrival date & time: 09/02/17  1029     History   Chief Complaint Chief Complaint  Patient presents with  . Hernia    HPI Marcus Pace is a 62 y.o. male.  HPI   62 year old male presents today with complaints of scrotal swelling.  Patient reports a year long history of worsening hernia, he notes that he has swelling down into his scrotum, knee he reports he is able to push this back into his abdomen.  He notes this has continued to worsen most notably over the last 4 months.  He notes that prolonged periods of standing or sitting on the toilet because worsening of symptoms, he notes he is always able to place it back in.  He denies any significant pain at this time, reports he has normal bowel movements, normal urination, denies any fever, nausea or vomiting.  Patient denies any abdominal pain.  Patient has not been seen as an outpatient for this.    History reviewed. No pertinent past medical history.  Patient Active Problem List   Diagnosis Date Noted  . Malnutrition of moderate degree 08/03/2015  . Influenza B 08/02/2015  . Bacteremia due to Gram-positive bacteria 08/02/2015  . Sepsis (HCC) 08/02/2015  . Tobacco abuse 08/02/2015  . Testicular pain, right 08/02/2015    History reviewed. No pertinent surgical history.      Home Medications    Prior to Admission medications   Medication Sig Start Date End Date Taking? Authorizing Provider  feeding supplement, ENSURE ENLIVE, (ENSURE ENLIVE) LIQD Take 237 mLs by mouth daily. 08/04/15   Ghimire, Werner Lean, MD  oseltamivir (TAMIFLU) 75 MG capsule Take 1 capsule (75 mg total) by mouth every 12 (twelve) hours. Take 6 more doses and then stop 08/04/15   Maretta Bees, MD    Family History No family history on file.  Social History Social History   Tobacco Use  . Smoking status: Current Every Day Smoker  Substance Use Topics    . Alcohol use: Yes  . Drug use: Not on file     Allergies   Patient has no known allergies.   Review of Systems Review of Systems  All other systems reviewed and are negative.    Physical Exam Updated Vital Signs BP 136/80   Pulse 72   Temp (!) 97.5 F (36.4 C) (Oral)   Resp 20   SpO2 98%   Physical Exam  Constitutional: He is oriented to person, place, and time. He appears well-developed and well-nourished.  HENT:  Head: Normocephalic and atraumatic.  Eyes: Pupils are equal, round, and reactive to light. Conjunctivae are normal. Right eye exhibits no discharge. Left eye exhibits no discharge. No scleral icterus.  Neck: Normal range of motion. No JVD present. No tracheal deviation present.  Pulmonary/Chest: Effort normal. No stridor.  Abdominal: Soft. He exhibits no distension and no mass. There is no tenderness. There is no rebound and no guarding.  Genitourinary:  Genitourinary Comments: Inguinal hernia with bowel into the scrotum, this is reducible on exam   Neurological: He is alert and oriented to person, place, and time. Coordination normal.  Psychiatric: He has a normal mood and affect. His behavior is normal. Judgment and thought content normal.  Nursing note and vitals reviewed.    ED Treatments / Results  Labs (all labs ordered are listed, but only abnormal results are displayed) Labs Reviewed - No data to display  EKG None  Radiology No results found.  Procedures Procedures (including critical care time)  Medications Ordered in ED Medications - No data to display   Initial Impression / Assessment and Plan / ED Course  I have reviewed the triage vital signs and the nursing notes.  Pertinent labs & imaging results that were available during my care of the patient were reviewed by me and considered in my medical decision making (see chart for details).      Labs:   Imaging:  Consults:  Therapeutics:  Discharge Meds:    Assessment/Plan: 62 year old male presents today with inguinal hernia.  This is reducible.  Patient has no signs of complication here.  He will be referred to outpatient general surgery, he is given strict return precautions, he verbalized understanding and agreement to today's plan had no further questions or concerns at the time discharge   Final Clinical Impressions(s) / ED Diagnoses   Final diagnoses:  Right inguinal hernia    ED Discharge Orders    None       Rosalio Loud 09/02/17 1657    Melene Plan, DO 09/02/17 2117

## 2017-09-02 NOTE — ED Triage Notes (Signed)
Pt presents for evaluation of hernia to R inguinal area. Pt reports has had hernia x 2-3 years but just started having worsening x 2-3 months. Reports does not reduce like it use to. Pt is ambulatory. Pt denies pain at this time.

## 2017-09-16 ENCOUNTER — Other Ambulatory Visit: Payer: Self-pay | Admitting: General Surgery

## 2017-12-22 ENCOUNTER — Ambulatory Visit: Payer: Self-pay | Attending: Family Medicine | Admitting: Family Medicine

## 2017-12-22 ENCOUNTER — Ambulatory Visit: Payer: Self-pay | Attending: Family Medicine | Admitting: Licensed Clinical Social Worker

## 2017-12-22 ENCOUNTER — Encounter: Payer: Self-pay | Admitting: Family Medicine

## 2017-12-22 VITALS — BP 134/88 | HR 88 | Temp 98.5°F | Resp 18 | Ht 73.0 in | Wt 139.0 lb

## 2017-12-22 DIAGNOSIS — Z833 Family history of diabetes mellitus: Secondary | ICD-10-CM | POA: Insufficient documentation

## 2017-12-22 DIAGNOSIS — K4090 Unilateral inguinal hernia, without obstruction or gangrene, not specified as recurrent: Secondary | ICD-10-CM

## 2017-12-22 DIAGNOSIS — Z8249 Family history of ischemic heart disease and other diseases of the circulatory system: Secondary | ICD-10-CM | POA: Insufficient documentation

## 2017-12-22 DIAGNOSIS — Z598 Other problems related to housing and economic circumstances: Secondary | ICD-10-CM

## 2017-12-22 DIAGNOSIS — Z809 Family history of malignant neoplasm, unspecified: Secondary | ICD-10-CM | POA: Insufficient documentation

## 2017-12-22 DIAGNOSIS — F419 Anxiety disorder, unspecified: Secondary | ICD-10-CM

## 2017-12-22 DIAGNOSIS — Z599 Problem related to housing and economic circumstances, unspecified: Secondary | ICD-10-CM

## 2017-12-22 MED ORDER — TRAMADOL HCL 50 MG PO TABS: 50.0000 mg | ORAL_TABLET | Freq: Four times a day (QID) | ORAL | 0 refills | Status: DC | PRN

## 2017-12-22 MED FILL — traMADol HCL 50 MG TABS: 50 | 5 days supply | Qty: 20 | Fill #0

## 2017-12-22 NOTE — Progress Notes (Signed)
Subjective:    Patient ID: Marcus Pace, male    DOB: 1956-02-04, 62 y.o.   MRN: [id number]  HPI 62 year old male who presents to establish care as a new patient.  Patient states that he has a hernia which causes him to have discomfort especially with activities and he also has discomfort when he tries to lie down at night to sleep.  Patient states that at his highest, the pain is generally around a 7-8.  Pain can occasionally be sharp.  Patient states that he is able to push the hernia back/up as he has been instructed to do in the past in the emergency department.  Patient states that sometimes this helps to relieve the discomfort but sometimes this does not.  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  Patient states that he has not worked since April of this year secondary to recurrent pain and difficulty with ambulation related to presence of the hernia.  Patient did have an emergency department visit on 09/02/2017 which was reviewed at today's visit.  Patient states that he has not been able to follow-up with surgery due to the cost.  Patient denies any other significant issues at today's visit.  Patient denies any chest pain or palpitations, no headaches or dizziness, no shortness of breath or cough.  Patient denies any recent fever or chills.  Patient denies any dysuria or difficulty with urination.  Patient states that since he is out of work, he has increased tobacco use up to 1 pack/day as he states that this helps with his nerves as he is anxious about his health and being out of work.  Patient denies any suicidal thoughts ideations.  Patient's family history significant for sister with diabetes, father with heart disease and patient has had a sister who died from cancer.  Patient states that he is currently living with 1 of his sisters and her family.  Patient denies any significant past  medical history other than the hernia. (per ED records, patient has had malnourishment and dietary supplement was recommended). Patient denies any prior surgeries.   No Known Allergies  Social History   Tobacco Use  . Smoking status: Current Every Day Smoker    Packs/day: 1.00  . Smokeless tobacco: Never Used  Substance Use Topics  . Alcohol use: Yes  . Drug use: Never     Review of Systems  Constitutional: Positive for fatigue. Negative for chills and fever.  HENT: Negative for sore throat and trouble swallowing.   Respiratory: Negative for cough and shortness of breath.   Cardiovascular: Negative for chest pain, palpitations and leg swelling.  Gastrointestinal: Negative for abdominal pain and nausea.  Genitourinary: Positive for scrotal swelling (due to right sided hernia). Negative for dysuria and frequency.  Musculoskeletal: Positive for back pain (occasional low back pain) and gait problem (feels that presence of hernia has affected his gait).  Neurological: Negative for headaches.  Psychiatric/Behavioral: Negative for suicidal ideas. The patient is nervous/anxious (concerned about his health and lack of income).        Objective:   Physical Exam  Constitutional:  Thin framed older male in no acute distress but with visible temporal wasting  HENT:  Mouth/Throat: Oropharynx is clear and moist.  Neck: Normal range of motion. Neck supple. No thyromegaly present.  Cardiovascular: Regular rhythm and normal heart sounds.  Pulmonary/Chest: Effort normal and breath  sounds normal.  Abdominal: Soft. Bowel sounds are normal. A hernia (Presence of right inguinal hernia which extends into the right scrotal sac) is present.  Genitourinary:  Genitourinary Comments: Patient with extension of her right inguinal hernia into the right scrotal sac causing displacement of penis to the left.  Patient does have some discomfort with palpation along the inguinal ring/groin area  Vitals  reviewed. BP 134/88 (BP Location: Left Arm, Patient Position: Sitting, Cuff Size: Normal)   Pulse 88   Temp 98.5 F (36.9 C) (Oral)   Resp 18   Ht '6\' 1"'  (1.854 m)   Wt 139 lb (63 kg)   SpO2 98%   BMI 18.34 kg/m       Assessment & Plan:  1. Inguinal hernia, right Patient's emergency department notes from 09/02/2017 visit were reviewed and patient additionally was seen in 2017 with similar finding of inguinal hernia.  Patient is being referred to general surgery.  Prescription given for tramadol to take as needed for pain.  Patient education given on hernias.  Patient was advised to seek immediate medical follow-up if he has intractable pain or if hernia is nonreducible.  Patient with score of 18 or higher on PHQ 9.  Medical social worker met with the patient after his visit to give options to help with his financial and emotional issues.  Patient is to follow-up postsurgically as needed.   - traMADol (ULTRAM) 50 MG tablet; Take 1 tablet (50 mg total) by mouth every 6 (six) hours as needed.  Dispense: 20 tablet; Refill: 0 - Ambulatory referral to General Surgery  An After Visit Summary was printed and given to the patient.  Return in about 3 months (around 03/24/2018), or if symptoms worsen or fail to improve, for surgery follow-up; sooner if needed.

## 2017-12-22 NOTE — Patient Instructions (Signed)
Inguinal Hernia, Adult An inguinal hernia is when fat or the intestines push through the area where the leg meets the lower belly (groin) and make a rounded lump (bulge). This condition happens over time. There are three types of inguinal hernias. These types include:  Hernias that can be pushed back into the belly (are reducible).  Hernias that cannot be pushed back into the belly (are incarcerated).  Hernias that cannot be pushed back into the belly and lose their blood supply (get strangulated). This type needs emergency surgery.  Follow these instructions at home: Lifestyle  Drink enough fluid to keep your urine (pee) clear or pale yellow.  Eat plenty of fruits, vegetables, and whole grains. These have a lot of fiber. Talk with your doctor if you have questions.  Avoid lifting heavy objects.  Avoid standing for long periods of time.  Do not use tobacco products. These include cigarettes, chewing tobacco, or e-cigarettes. If you need help quitting, ask your doctor.  Try to stay at a healthy weight. General instructions  Do not try to force the hernia back in.  Watch your hernia for any changes in color or size. Let your doctor know if there are any changes.  Take over-the-counter and prescription medicines only as told by your doctor.  Keep all follow-up visits as told by your doctor. This is important. Contact a doctor if:  You have a fever.  You have new symptoms.  Your symptoms get worse. Get help right away if:  The area where the legs meets the lower belly has: ? Pain that gets worse suddenly. ? A bulge that gets bigger suddenly and does not go down. ? A bulge that turns red or purple. ? A bulge that is painful to the touch.  You are a man and your scrotum: ? Suddenly feels painful. ? Suddenly changes in size.  You feel sick to your stomach (nauseous) and this feeling does not go away.  You throw up (vomit) and this keeps happening.  You feel your heart  beating a lot more quickly than normal.  You cannot poop (have a bowel movement) or pass gas. This information is not intended to replace advice given to you by your health care provider. Make sure you discuss any questions you have with your health care provider. Document Released: 05/23/2006 Document Revised: 09/28/2015 Document Reviewed: 03/02/2014 Elsevier Interactive Patient Education  2018 Elsevier Inc.  

## 2017-12-23 NOTE — BH Specialist Note (Signed)
Integrated Behavioral Health Initial Visit  MRN: [id number] Name: Marcus EdwardsHerman G Tornow  Number of Integrated Behavioral Health Clinician visits:: 1/6 Session Start time: 11:45 AM  Session End time: 12:15 PM Total time: 30 minutes  Type of Service: Integrated Behavioral Health- Individual/Family Interpretor:No. Interpretor Name and Language: N/A   Warm Hand Off Completed.       SUBJECTIVE: Marcus Pace is a 62 y.o. male accompanied by self Patient was referred by Dr. Jillyn HiddenFulp for anxiety and depression. Patient reports the following symptoms/concerns: feelings of sadness and worry, difficulty sleeping, feeling bad about self, inability to relax, restlessness, and irritability Duration of problem: April 2019; Severity of problem: mild  OBJECTIVE: Mood: Anxious and Affect: Appropriate Risk of harm to self or others: No plan to harm self or others  LIFE CONTEXT: Family and Social: Pt receives support from sister, who he is staying with. School/Work: Pt has been unemployed since April 2019. He receives food stamps ($190) and has the [employer] Self-Care: Pt smokes cigarettes (1ppd) to cope with stressors. Denies any other substance use Life Changes: Pt has an ongoing medical condition that results in chronic pain. Due to this, he was unable to work and is currently experiencing financial strain.  GOALS ADDRESSED: Patient will: 1. Reduce symptoms of: anxiety and depression 2. Increase knowledge and/or ability of: coping skills and healthy habits  3. Demonstrate ability to: Increase healthy adjustment to current life circumstances and Increase adequate support systems for patient/family  INTERVENTIONS: Interventions utilized: Supportive Counseling, Psychoeducation and/or Health Education and Link to [facility]  Standardized Assessments completed: GAD-7 and PHQ 2&9  ASSESSMENT: Patient currently experiencing anxiety and depression triggered by an ongoing medical condition  that results in chronic pain. Pt is currently not working as a result. He reports feelings of sadness and worry, difficulty sleeping, feeling bad about self, inability to relax, restlessness, and irritability. Pt is currently receiving limited support from his sister, who he is residing with temporarily.    Patient may benefit from psychoeducation and psychotherapy. LCSWA educated pt on the correlation between one's physical and mental health, in addition, to how stress can negatively impact both. Healthy coping skills were discussed to decrease symptoms, assist in management of pain, and promote positive feelings. Supportive resources were provided to assist pt with transitioning back to employment, in addition, to incontinent supplies.   PLAN: 1. Follow up with behavioral health clinician on : Pt was encouraged to contact LCSWA if symptoms worsen or fail to improve to [facility]. 2. Behavioral recommendations: LCSWA recommends that pt apply healthy coping skills discussed and utilize provided resources. Pt is encouraged to schedule follow up appointment with LCSWA 3. Referral(s): Community Resources:  Finances 4. "From scale of 1-10, how likely are you to follow plan?":   Bridgett LarssonJasmine D Theodoro Koval, LCSW 12/23/17 1:58PM

## 2017-12-30 ENCOUNTER — Ambulatory Visit: Payer: Self-pay | Attending: Family Medicine

## 2018-01-09 ENCOUNTER — Telehealth: Payer: Self-pay | Admitting: Family Medicine

## 2018-01-09 NOTE — Telephone Encounter (Signed)
Please call patient and find out issue. He would need to speak with the financial counselors here for assistance. Please ask that he go to the ED if he is acutely depressed or having any suicidal ideations or thoughts

## 2018-01-09 NOTE — Telephone Encounter (Signed)
Patient called requesting to speak to PCP in regards to financial assistance patient states he not understanding what he is being asked for . Tried to assist patient in understanding but insisted in talking with PCP.He states he has been feeling depressed. Please f/up

## 2018-01-09 NOTE — Telephone Encounter (Signed)
I returned the call to the patient. He states that he met with the financial counselor last week and he brought in the requested information but he says that the financial counselor was condescending both at the initial meeting and then when he returned the information he was told that it was the wrong paperwork. I suggested that patient contact the office next week and leave a message for or ask to speak with the practice manager regarding his concerns.

## 2018-01-16 ENCOUNTER — Telehealth: Payer: Self-pay | Admitting: Licensed Clinical Social Worker

## 2018-01-16 NOTE — Telephone Encounter (Signed)
LCSWA attempted to contact pt to follow up on behavioral health and resource needs. LCSWA left message for a return call.  

## 2018-01-20 ENCOUNTER — Telehealth: Payer: Self-pay | Admitting: Licensed Clinical Social Worker

## 2018-01-20 NOTE — Telephone Encounter (Signed)
LCSWA attempted to contact pt to follow up on behavioral health and resource needs. LCSWA left message for a return call.

## 2018-01-22 ENCOUNTER — Other Ambulatory Visit: Payer: Self-pay | Admitting: Family Medicine

## 2018-01-22 ENCOUNTER — Telehealth: Payer: Self-pay | Admitting: Family Medicine

## 2018-01-22 DIAGNOSIS — K4090 Unilateral inguinal hernia, without obstruction or gangrene, not specified as recurrent: Secondary | ICD-10-CM

## 2018-01-22 MED ORDER — TRAMADOL HCL 50 MG PO TABS: 50.0000 mg | ORAL_TABLET | Freq: Four times a day (QID) | ORAL | 0 refills | Status: DC | PRN

## 2018-01-22 MED FILL — traMADol HCL 50 MG TABS: 50 | 5 days supply | Qty: 20 | Fill #0

## 2018-01-22 NOTE — Telephone Encounter (Signed)
Pt came in to request a medication refill on -traMADol (ULTRAM) 50 MG tablet  To [facility] Please follow up

## 2018-01-22 NOTE — Progress Notes (Signed)
Patient called to request a refill of his tramadol for pain related to his hernia and medication refill will be sent into this pharmacy per patient request.

## 2018-01-22 NOTE — Telephone Encounter (Signed)
I will place a refill for patient's tramadol. Please notify patient

## 2018-01-30 ENCOUNTER — Ambulatory Visit: Payer: Self-pay | Attending: Family Medicine

## 2018-01-30 ENCOUNTER — Ambulatory Visit: Payer: Self-pay

## 2018-02-02 NOTE — Telephone Encounter (Signed)
Medication was picked up.

## 2018-11-11 ENCOUNTER — Emergency Department (HOSPITAL_COMMUNITY)
Admission: EM | Admit: 2018-11-11 | Discharge: 2018-11-11 | Disposition: A | Discharge status: Home / Self Care | Payer: Self-pay | Attending: Emergency Medicine | Admitting: Emergency Medicine

## 2018-11-11 ENCOUNTER — Encounter (HOSPITAL_COMMUNITY): Payer: Self-pay | Admitting: *Deleted

## 2018-11-11 DIAGNOSIS — K4090 Unilateral inguinal hernia, without obstruction or gangrene, not specified as recurrent: Secondary | ICD-10-CM | POA: Insufficient documentation

## 2018-11-11 DIAGNOSIS — Z5321 Procedure and treatment not carried out due to patient leaving prior to being seen by health care provider: Secondary | ICD-10-CM | POA: Insufficient documentation

## 2018-11-11 NOTE — ED Triage Notes (Signed)
Per EMS, pt from home with right inguinal hernia x 2 years.  120/90 HR 92 RR 18 O2 96% on RA CBG 105 Temp 98.6

## 2019-07-08 ENCOUNTER — Encounter (HOSPITAL_COMMUNITY): Payer: Self-pay

## 2019-07-08 ENCOUNTER — Ambulatory Visit (HOSPITAL_COMMUNITY)
Admission: EM | Admit: 2019-07-08 | Discharge: 2019-07-08 | Disposition: A | Discharge status: Home / Self Care | Payer: Self-pay | Attending: Urgent Care | Admitting: Urgent Care

## 2019-07-08 ENCOUNTER — Other Ambulatory Visit: Payer: Self-pay

## 2019-07-08 ENCOUNTER — Ambulatory Visit (INDEPENDENT_AMBULATORY_CARE_PROVIDER_SITE_OTHER): Payer: Self-pay

## 2019-07-08 DIAGNOSIS — N201 Calculus of ureter: Secondary | ICD-10-CM

## 2019-07-08 DIAGNOSIS — M5416 Radiculopathy, lumbar region: Secondary | ICD-10-CM

## 2019-07-08 DIAGNOSIS — M5136 Other intervertebral disc degeneration, lumbar region: Secondary | ICD-10-CM

## 2019-07-08 DIAGNOSIS — R32 Unspecified urinary incontinence: Secondary | ICD-10-CM

## 2019-07-08 DIAGNOSIS — K5900 Constipation, unspecified: Secondary | ICD-10-CM

## 2019-07-08 MED ORDER — METHYLPREDNISOLONE ACETATE 80 MG/ML IJ SUSP
INTRAMUSCULAR | Status: AC
  Filled 2019-07-08: qty 1

## 2019-07-08 MED ORDER — MELOXICAM 7.5 MG PO TABS: 7.5000 mg | ORAL_TABLET | Freq: Every day | ORAL | 0 refills | Status: DC

## 2019-07-08 MED ORDER — TIZANIDINE HCL 4 MG PO TABS: 4.0000 mg | ORAL_TABLET | Freq: Every evening | ORAL | 0 refills | Status: DC | PRN

## 2019-07-08 MED ORDER — METHYLPREDNISOLONE ACETATE 80 MG/ML IJ SUSP
80.0000 mg | Freq: Once | INTRAMUSCULAR | Status: AC
  Administered 2019-07-08: 80 mg via INTRAMUSCULAR

## 2019-07-08 MED FILL — MELOXICAM 7.5 MG TABLET: 7.5 | 30 days supply | Qty: 30 | Fill #0

## 2019-07-08 MED FILL — tiZANidine HCL 4 MG TABS: 4 | 30 days supply | Qty: 30 | Fill #0

## 2019-07-08 NOTE — Discharge Instructions (Addendum)
Please establish care with a new regular provider through [facility]. Please also contact [facility] for more work up regarding your urinary incontinence, possible stone and new onset constipation which might be related to your urinary symptoms.   For your back, start using meloxicam next week if you have back pain. This should be used once daily only if you need it. Use tizanidine as a muscle relaxant at bedtime.   Please also contact [facility] to make sure you have a colonoscopy to make sure that you are being evaluated for this too due to your new constipation.

## 2019-07-08 NOTE — ED Triage Notes (Signed)
Pt states he has back pain this pain started Monday morning when he got up. Pt states the pain radiates down into his leg.

## 2019-07-08 NOTE — ED Provider Notes (Addendum)
[facility]   MRN: [id number] DOB: Jul 08, 1955  Subjective:   Marcus Pace is a 64 y.o. male presenting for 2 day history of acute onset low back pain over left side that radiates into his buttock and into the back of his thigh.  Patient states that he feels tingling sensations as well.  Has started to have constipation in the past 2 weeks, difficulty with straining to urinate and at times incontinence.  He states that the urinary incontinence has happened progressively over the past year, currently wears adult diapers.  He has used ibuprofen for his pain and was helping initially but not anymore.  He does not get regular care.  He does not have any health insurance.  Patient is retired but still does work at [employer], does some like to moderate lifting.  No particular inciting events happened that he can recall on Monday when his back pain started. Has never gotten regular care, no colonoscopy. Denies hx of renal stone.   No current facility-administered medications for this encounter.  Current Outpatient Medications:  .  feeding supplement, ENSURE ENLIVE, (ENSURE ENLIVE) LIQD, Take 237 mLs by mouth daily. (Patient not taking: Reported on 12/22/2017), Disp: 30 Bottle, Rfl: 0   No Known Allergies  History reviewed. No pertinent past medical history.   History reviewed. No pertinent surgical history.  History reviewed. No pertinent family history.  Social History   Tobacco Use  . Smoking status: Current Every Day Smoker    Packs/day: 1.00  . Smokeless tobacco: Never Used  Substance Use Topics  . Alcohol use: Yes  . Drug use: Never    ROS   Objective:   Vitals: BP 115/74 (BP Location: Right Arm)   Pulse 65   Temp 98.1 F (36.7 C) (Oral)   Resp 16   Wt 115 lb (52.2 kg)   SpO2 99%   BMI 15.17 kg/m   Wt Readings from Last 3 Encounters:  07/08/19 115 lb (52.2 kg)  12/22/17 139 lb (63 kg)  08/04/15 127 lb 8 oz (57.8 kg)   Physical  Exam Constitutional:      General: He is not in acute distress.    Appearance: Normal appearance. He is well-developed. He is ill-appearing (cachectic). He is not toxic-appearing or diaphoretic.  HENT:     Head: Normocephalic and atraumatic.     Right Ear: External ear normal.     Left Ear: External ear normal.     Nose: Nose normal.     Mouth/Throat:     Pharynx: Oropharynx is clear.  Eyes:     General: No scleral icterus.       Right eye: No discharge.        Left eye: No discharge.     Extraocular Movements: Extraocular movements intact.     Pupils: Pupils are equal, round, and reactive to light.  Cardiovascular:     Rate and Rhythm: Normal rate.  Pulmonary:     Effort: Pulmonary effort is normal.  Musculoskeletal:     Cervical back: Normal range of motion.     Lumbar back: Tenderness (over area outlined) present. No swelling, edema, deformity, signs of trauma, lacerations, spasms or bony tenderness. Decreased range of motion. Negative right straight leg raise test and negative left straight leg raise test. No scoliosis.       Back:  Skin:    General: Skin is warm and dry.  Neurological:     Mental Status: He is alert and  oriented to person, place, and time.     Motor: No weakness.     Deep Tendon Reflexes: Reflexes abnormal (patellar tendon).  Psychiatric:        Mood and Affect: Mood normal.        Behavior: Behavior normal.        Thought Content: Thought content normal.        Judgment: Judgment normal.    DG Lumbar Spine Complete  Result Date: 07/08/2019 CLINICAL DATA:  Acute low back pain since Monday, woke up with pain in lumbar spine radiating down right hip and right leg to knee EXAM: LUMBAR SPINE - COMPLETE 4+ VIEW COMPARISON:  05/28/2004 FINDINGS: Marked degenerative change particularly in the lower lumbar spine greatest at L1-L2 and L4-5 and L5-S1, lower lumbar degenerative changes have developed since the previous study. Disc space narrowing was present w on  the previous exam at L1-L2. No signs of acute fracture. Mild retrolisthesis of L2 on L3 approximately 2-3 mm and similar retrolisthesis of L1 on L2., retrolisthesis at L1-L2 was present on the previous study. Question of calculus projecting over the right sacrum approximately 7 mm in size versus radiopaque area formed stool in the fecal stream, this projects over the right S1 level and moves anterior with obliquity. IMPRESSION: Marked degenerative changes have developed in the lumbar spine since 2006. No acute osseous abnormality. Possible distal right ureteral calculus. Consider CT for further assessment as clinically warranted. Electronically Signed   By: Zetta Bills M.D.   On: 07/08/2019 13:36    Assessment and Plan :   1. Lumbar radiculopathy, acute   2. Degenerative disc disease, lumbar   3. Urinary incontinence, unspecified type   4. Constipation, unspecified constipation type   5. Ureteral calculus, right     Patient has very concerning symptoms that for malignancy: weight loss, incontinence, new onset constipation, possible radiopaque fecal matter in his urinary stream. Reviewed findings with patient, including possibility of renal stone. Counseled on need to follow-up with both urology and gastroenterology for further assessment.  Today, given his findings for progressive degenerative disc disease, he was given IM Depo-Medrol.  He is to schedule Tylenol and use Zanaflex as an outpatient.  Emphasized need to set up regular care through [facility].  Patient verbalizes understanding and will try to follow-up with the specialty practices and set up his care with [facility] internal medicine. Counseled patient on potential for adverse effects with medications prescribed/recommended today, ER and return-to-clinic precautions discussed, patient verbalized understanding.    Jaynee Eagles, [place] 07/08/19 1417

## 2019-07-20 ENCOUNTER — Inpatient Hospital Stay (HOSPITAL_COMMUNITY)
Admission: EM | Admit: 2019-07-20 | Discharge: 2019-07-23 | DRG: 871 | Disposition: A | Discharge status: Home / Self Care | Payer: Self-pay | Attending: Internal Medicine | Admitting: Internal Medicine

## 2019-07-20 ENCOUNTER — Emergency Department (HOSPITAL_COMMUNITY): Payer: Self-pay

## 2019-07-20 ENCOUNTER — Encounter (HOSPITAL_COMMUNITY): Payer: Self-pay | Admitting: Emergency Medicine

## 2019-07-20 DIAGNOSIS — I2694 Multiple subsegmental pulmonary emboli without acute cor pulmonale: Secondary | ICD-10-CM

## 2019-07-20 DIAGNOSIS — K769 Liver disease, unspecified: Secondary | ICD-10-CM | POA: Diagnosis present

## 2019-07-20 DIAGNOSIS — Z79899 Other long term (current) drug therapy: Secondary | ICD-10-CM

## 2019-07-20 DIAGNOSIS — J90 Pleural effusion, not elsewhere classified: Secondary | ICD-10-CM | POA: Diagnosis present

## 2019-07-20 DIAGNOSIS — J852 Abscess of lung without pneumonia: Secondary | ICD-10-CM | POA: Diagnosis present

## 2019-07-20 DIAGNOSIS — M545 Low back pain, unspecified: Secondary | ICD-10-CM

## 2019-07-20 DIAGNOSIS — Z560 Unemployment, unspecified: Secondary | ICD-10-CM

## 2019-07-20 DIAGNOSIS — R911 Solitary pulmonary nodule: Secondary | ICD-10-CM | POA: Diagnosis present

## 2019-07-20 DIAGNOSIS — J851 Abscess of lung with pneumonia: Secondary | ICD-10-CM | POA: Diagnosis present

## 2019-07-20 DIAGNOSIS — R918 Other nonspecific abnormal finding of lung field: Secondary | ICD-10-CM | POA: Diagnosis present

## 2019-07-20 DIAGNOSIS — J189 Pneumonia, unspecified organism: Secondary | ICD-10-CM | POA: Diagnosis present

## 2019-07-20 DIAGNOSIS — J479 Bronchiectasis, uncomplicated: Secondary | ICD-10-CM

## 2019-07-20 DIAGNOSIS — Z9889 Other specified postprocedural states: Secondary | ICD-10-CM

## 2019-07-20 DIAGNOSIS — J439 Emphysema, unspecified: Secondary | ICD-10-CM | POA: Diagnosis present

## 2019-07-20 DIAGNOSIS — R160 Hepatomegaly, not elsewhere classified: Secondary | ICD-10-CM | POA: Diagnosis present

## 2019-07-20 DIAGNOSIS — K5900 Constipation, unspecified: Secondary | ICD-10-CM | POA: Diagnosis present

## 2019-07-20 DIAGNOSIS — I2699 Other pulmonary embolism without acute cor pulmonale: Secondary | ICD-10-CM

## 2019-07-20 DIAGNOSIS — K639 Disease of intestine, unspecified: Secondary | ICD-10-CM | POA: Diagnosis present

## 2019-07-20 DIAGNOSIS — F1010 Alcohol abuse, uncomplicated: Secondary | ICD-10-CM | POA: Diagnosis present

## 2019-07-20 DIAGNOSIS — Z20822 Contact with and (suspected) exposure to covid-19: Secondary | ICD-10-CM | POA: Diagnosis present

## 2019-07-20 DIAGNOSIS — A419 Sepsis, unspecified organism: Principal | ICD-10-CM | POA: Diagnosis present

## 2019-07-20 DIAGNOSIS — J432 Centrilobular emphysema: Secondary | ICD-10-CM | POA: Diagnosis present

## 2019-07-20 DIAGNOSIS — K4090 Unilateral inguinal hernia, without obstruction or gangrene, not specified as recurrent: Secondary | ICD-10-CM

## 2019-07-20 DIAGNOSIS — J470 Bronchiectasis with acute lower respiratory infection: Secondary | ICD-10-CM | POA: Diagnosis present

## 2019-07-20 DIAGNOSIS — K529 Noninfective gastroenteritis and colitis, unspecified: Secondary | ICD-10-CM

## 2019-07-20 DIAGNOSIS — F17210 Nicotine dependence, cigarettes, uncomplicated: Secondary | ICD-10-CM | POA: Diagnosis present

## 2019-07-20 DIAGNOSIS — Z791 Long term (current) use of non-steroidal anti-inflammatories (NSAID): Secondary | ICD-10-CM

## 2019-07-20 DIAGNOSIS — M7918 Myalgia, other site: Secondary | ICD-10-CM

## 2019-07-20 HISTORY — DX: Other pulmonary embolism without acute cor pulmonale: I26.99

## 2019-07-20 HISTORY — DX: Bacteremia: R78.81

## 2019-07-20 HISTORY — DX: Emphysema, unspecified: J43.9

## 2019-07-20 HISTORY — DX: Unilateral inguinal hernia, without obstruction or gangrene, recurrent: K40.91

## 2019-07-20 HISTORY — DX: Solitary pulmonary nodule: R91.1

## 2019-07-20 LAB — CBC WITH DIFFERENTIAL/PLATELET
Abs Immature Granulocytes: 0.07 10*3/uL (ref 0.00–0.07)
Basophils Absolute: 0.1 10*3/uL (ref 0.0–0.1)
Basophils Relative: 0 %
Eosinophils Absolute: 0.2 10*3/uL (ref 0.0–0.5)
Eosinophils Relative: 1 %
HCT: 47.4 % (ref 39.0–52.0)
Hemoglobin: 15.7 g/dL (ref 13.0–17.0)
Immature Granulocytes: 0 %
Lymphocytes Relative: 9 %
Lymphs Abs: 1.5 10*3/uL (ref 0.7–4.0)
MCH: 28.5 pg (ref 26.0–34.0)
MCHC: 33.1 g/dL (ref 30.0–36.0)
MCV: 86 fL (ref 80.0–100.0)
Monocytes Absolute: 1.8 10*3/uL — ABNORMAL HIGH (ref 0.1–1.0)
Monocytes Relative: 12 %
Neutro Abs: 12.2 10*3/uL — ABNORMAL HIGH (ref 1.7–7.7)
Neutrophils Relative %: 78 %
Platelets: 506 10*3/uL — ABNORMAL HIGH (ref 150–400)
RBC: 5.51 MIL/uL (ref 4.22–5.81)
RDW: 13.8 % (ref 11.5–15.5)
WBC: 15.8 10*3/uL — ABNORMAL HIGH (ref 4.0–10.5)
nRBC: 0 % (ref 0.0–0.2)

## 2019-07-20 LAB — URINALYSIS, ROUTINE W REFLEX MICROSCOPIC
Bilirubin Urine: NEGATIVE
Glucose, UA: NEGATIVE mg/dL
Hgb urine dipstick: NEGATIVE
Ketones, ur: NEGATIVE mg/dL
Leukocytes,Ua: NEGATIVE
Nitrite: NEGATIVE
Protein, ur: NEGATIVE mg/dL
Specific Gravity, Urine: 1.013 (ref 1.005–1.030)
pH: 7 (ref 5.0–8.0)

## 2019-07-20 LAB — COMPREHENSIVE METABOLIC PANEL WITH GFR
ALT: 67 U/L — ABNORMAL HIGH (ref 0–44)
AST: 38 U/L (ref 15–41)
Albumin: 3.2 g/dL — ABNORMAL LOW (ref 3.5–5.0)
Alkaline Phosphatase: 106 U/L (ref 38–126)
Anion gap: 12 (ref 5–15)
BUN: 9 mg/dL (ref 8–23)
CO2: 27 mmol/L (ref 22–32)
Calcium: 9 mg/dL (ref 8.9–10.3)
Chloride: 96 mmol/L — ABNORMAL LOW (ref 98–111)
Creatinine, Ser: 0.84 mg/dL (ref 0.61–1.24)
GFR calc Af Amer: >60 mL/min
GFR calc non Af Amer: >60 mL/min
Glucose, Bld: 109 mg/dL — ABNORMAL HIGH (ref 70–99)
Potassium: 3.7 mmol/L (ref 3.5–5.1)
Sodium: 135 mmol/L (ref 135–145)
Total Bilirubin: 1 mg/dL (ref 0.3–1.2)
Total Protein: 8.5 g/dL — ABNORMAL HIGH (ref 6.5–8.1)

## 2019-07-20 LAB — PROTIME-INR
INR: 1.1 (ref 0.8–1.2)
Prothrombin Time: 13.9 seconds (ref 11.4–15.2)

## 2019-07-20 LAB — LIPASE, BLOOD: Lipase: 15 U/L (ref 11–51)

## 2019-07-20 LAB — LACTIC ACID, PLASMA
Lactic Acid, Venous: 2.1 mmol/L (ref 0.5–1.9)
Lactic Acid, Venous: 1.1 mmol/L (ref 0.5–1.9)

## 2019-07-20 LAB — SARS CORONAVIRUS 2 (TAT 6-24 HRS): SARS Coronavirus 2: NEGATIVE

## 2019-07-20 LAB — APTT: aPTT: 34 s (ref 24–36)

## 2019-07-20 LAB — TSH: TSH: 0.993 u[IU]/mL (ref 0.350–4.500)

## 2019-07-20 MED ORDER — MELOXICAM 7.5 MG PO TABS
7.5000 mg | ORAL_TABLET | Freq: Every day | ORAL | Status: DC
  Administered 2019-07-20: 7.5 mg via ORAL
  Filled 2019-07-20 (×2): qty 1

## 2019-07-20 MED ORDER — AZITHROMYCIN 250 MG PO TABS
250.0000 mg | ORAL_TABLET | Freq: Every day | ORAL | Status: DC
  Administered 2019-07-21 – 2019-07-22 (×2): 250 mg via ORAL
  Filled 2019-07-20 (×2): qty 1

## 2019-07-20 MED ORDER — SODIUM CHLORIDE 0.9 % IV SOLN
1.0000 g | Freq: Once | INTRAVENOUS | Status: AC
  Administered 2019-07-20: 1 g via INTRAVENOUS
  Filled 2019-07-20: qty 10

## 2019-07-20 MED ORDER — HEPARIN BOLUS VIA INFUSION
3000.0000 [IU] | Freq: Once | INTRAVENOUS | Status: AC
  Administered 2019-07-20: 3000 [IU] via INTRAVENOUS
  Filled 2019-07-20: qty 3000

## 2019-07-20 MED ORDER — LACTATED RINGERS IV SOLN: INTRAVENOUS | Status: AC

## 2019-07-20 MED ORDER — SENNA 8.6 MG PO TABS
1.0000 | ORAL_TABLET | Freq: Two times a day (BID) | ORAL | Status: DC
  Administered 2019-07-20 – 2019-07-23 (×6): 8.6 mg via ORAL
  Filled 2019-07-20 (×6): qty 1

## 2019-07-20 MED ORDER — SODIUM CHLORIDE (PF) 0.9 % IJ SOLN
INTRAMUSCULAR | Status: AC
  Filled 2019-07-20: qty 50

## 2019-07-20 MED ORDER — SODIUM CHLORIDE 0.9 % IV SOLN: 1.0000 g | Freq: Three times a day (TID) | INTRAVENOUS | Status: DC

## 2019-07-20 MED ORDER — HEPARIN (PORCINE) 25000 UT/250ML-% IV SOLN
900.0000 [IU]/h | INTRAVENOUS | Status: DC
  Administered 2019-07-20: 900 [IU]/h via INTRAVENOUS
  Filled 2019-07-20: qty 250

## 2019-07-20 MED ORDER — IOHEXOL 300 MG/ML  SOLN
100.0000 mL | Freq: Once | INTRAMUSCULAR | Status: AC | PRN
  Administered 2019-07-20: 100 mL via INTRAVENOUS

## 2019-07-20 MED ORDER — KETOROLAC TROMETHAMINE 30 MG/ML IJ SOLN
30.0000 mg | Freq: Four times a day (QID) | INTRAMUSCULAR | Status: DC | PRN
  Administered 2019-07-21: 30 mg via INTRAVENOUS
  Filled 2019-07-20: qty 1

## 2019-07-20 MED ORDER — SODIUM CHLORIDE 0.9 % IV SOLN: 1.0000 g | INTRAVENOUS | Status: DC

## 2019-07-20 MED ORDER — SODIUM CHLORIDE 0.9 % IV BOLUS
1000.0000 mL | Freq: Once | INTRAVENOUS | Status: AC
  Administered 2019-07-20: 1000 mL via INTRAVENOUS

## 2019-07-20 MED ORDER — ACETAMINOPHEN 325 MG PO TABS: 650.0000 mg | ORAL_TABLET | Freq: Four times a day (QID) | ORAL | Status: DC | PRN

## 2019-07-20 MED ORDER — SODIUM CHLORIDE 0.9 % IV SOLN
2.0000 g | INTRAVENOUS | Status: AC
  Administered 2019-07-20: 2 g via INTRAVENOUS
  Filled 2019-07-20: qty 2

## 2019-07-20 MED ORDER — ACETAMINOPHEN 650 MG RE SUPP: 650.0000 mg | Freq: Four times a day (QID) | RECTAL | Status: DC | PRN

## 2019-07-20 MED ORDER — SODIUM CHLORIDE 0.9 % IV SOLN: Freq: Once | INTRAVENOUS | Status: AC

## 2019-07-20 MED ORDER — AZITHROMYCIN 250 MG PO TABS
500.0000 mg | ORAL_TABLET | Freq: Once | ORAL | Status: AC
  Administered 2019-07-20: 500 mg via ORAL
  Filled 2019-07-20: qty 2

## 2019-07-20 MED ORDER — ENSURE ENLIVE PO LIQD
237.0000 mL | ORAL | Status: DC
  Administered 2019-07-21 – 2019-07-22 (×3): 237 mL via ORAL
  Filled 2019-07-20: qty 237

## 2019-07-20 NOTE — ED Notes (Signed)
XR at bedside

## 2019-07-20 NOTE — ED Provider Notes (Addendum)
[facility]-EMERGENCY DEPT Provider Note   CSN: [id number] Arrival date & time: 07/20/19  1406     History Chief Complaint  Patient presents with  . Back Pain  . Cough    Marcus Pace is a 64 y.o. male.  HPI  With history of tobacco and alcohol abuse, history of malnutrition, history of bacteremia.  Patient has been smoking half pack of cigarettes per day since the 80s.  Drinks a 40 of beer per day every day for years.   Patient complains of left lower back pain that began 2 weeks ago and states that he woke up with the pain.   He states that it hurts when he coughs or stands up and walks completely resolves when he is sitting still in his chair.  Patient endorses some abdominal discomfort and states that it seems to worsen when he stands up and walks.  Patient was recently seen in urgent care and abdominal x-ray which showed possible ureteral stone on the right side.  Patient also has significant hernia present in the scrotum.  He states that he is able to reduce this without difficulty.  Denies any pain in his genitals.   Patient denies any trauma, history of cancer, denies blood thinner use.  Patient denies any history of blood clots, denies any chest pain or shortness of breath.  Patient denies any changes with ability to urinate.  States that he is having some constipation moderate no difficulty with BMs.     History reviewed. No pertinent past medical history.  Patient Active Problem List   Diagnosis Date Noted  . Inguinal hernia, right 07/20/2019  . Lesion of right lung 07/20/2019  . Pulmonary abscess (HCC) 07/20/2019  . Emphysema of lung (HCC) 07/20/2019  . Bronchiectasis (HCC) 07/20/2019  . Pneumonia due to organism 07/20/2019  . Sigmoid thickening 07/20/2019  . Pulmonary emboli (HCC) 07/20/2019  . RLL pneumonia 07/20/2019  . Malnutrition of moderate degree 08/03/2015  . Influenza B 08/02/2015  . Bacteremia due to Gram-positive bacteria  08/02/2015  . Sepsis (HCC) 08/02/2015  . Tobacco abuse 08/02/2015  . Testicular pain, right 08/02/2015    History reviewed. No pertinent surgical history.     No family history on file.  Social History   Tobacco Use  . Smoking status: Current Every Day Smoker    Packs/day: 1.00  . Smokeless tobacco: Never Used  Substance Use Topics  . Alcohol use: Yes  . Drug use: Never    Home Medications Prior to Admission medications   Medication Sig Start Date End Date Taking? Authorizing Provider  ibuprofen (ADVIL) 400 MG tablet Take 400 mg by mouth every 6 (six) hours as needed for moderate pain.   Yes [provider]  feeding supplement, ENSURE ENLIVE, (ENSURE ENLIVE) LIQD Take 237 mLs by mouth daily. Patient not taking: Reported on 12/22/2017 08/04/15   Maretta Bees, MD  meloxicam (MOBIC) 7.5 MG tablet Take 1 tablet (7.5 mg total) by mouth daily. 07/08/19   Wallis Bamberg, PA-C  tiZANidine (ZANAFLEX) 4 MG tablet Take 1 tablet (4 mg total) by mouth at bedtime as needed for muscle spasms. 07/08/19   Wallis Bamberg, PA-C    Allergies    Patient has no known allergies.  Review of Systems   Review of Systems  Constitutional: Positive for appetite change and chills. Negative for fever.  HENT: Negative for congestion.   Eyes: Negative for pain.  Respiratory: Positive for cough. Negative for shortness of breath.  Cardiovascular: Negative for chest pain and leg swelling.  Gastrointestinal: Negative for abdominal pain and vomiting.  Genitourinary: Negative for dysuria.  Musculoskeletal: Positive for back pain. Negative for myalgias.  Skin: Negative for rash.  Neurological: Negative for dizziness and headaches.    Physical Exam Updated Vital Signs BP 138/83   Pulse 87   Temp 99.5 F (37.5 C) (Oral)   Resp (!) 36   Ht  (1.854 m)   SpO2 98%   BMI 15.17 kg/m   Physical Exam Vitals and nursing note reviewed.  Constitutional:      General: He is not in acute  distress.    Appearance: He is ill-appearing.     Comments: Thin, cachectic appearing 64 year old male.  Pleasant, able answer questions properly follow commands.  HENT:     Head: Normocephalic and atraumatic.     Nose: Nose normal.  Eyes:     General: No scleral icterus. Cardiovascular:     Rate and Rhythm: Normal rate and regular rhythm.     Pulses: Normal pulses.     Heart sounds: Normal heart sounds.  Pulmonary:     Effort: Pulmonary effort is normal. No respiratory distress.     Breath sounds: No wheezing.     Comments: Patient is breathing respiratory rate of approximately 30.  He is breathing shallow.  Lungs are grossly clear to auscultation bilaterally.  Some decreased lung sounds in the right base.  Crackles in the right base. Abdominal:     Palpations: Abdomen is soft.     Tenderness: There is abdominal tenderness (Some left lower quadrant tenderness to palpation). There is no right CVA tenderness, left CVA tenderness, guarding or rebound.  Musculoskeletal:     Cervical back: Normal range of motion.     Right lower leg: No edema.     Left lower leg: No edema.     Comments: No reproducible tenderness to palpation of the back or paraspinal muscles.  Skin:    General: Skin is warm and dry.     Capillary Refill: Capillary refill takes less than 2 seconds.  Neurological:     Mental Status: He is alert. Mental status is at baseline.     Comments: Alert and oriented to self, place, time and event.   Speech is fluent, clear without dysarthria or dysphasia.   Strength 5/5 in upper/lower extremities  Sensation intact in upper/lower extremities   Normal gait.  Negative Romberg. No pronator drift.  Normal finger-to-nose and feet tapping.  CN I not tested  CN II grossly intact visual fields bilaterally. Did not visualize posterior eye.   CN III, IV, VI PERRLA and EOMs intact bilaterally  CN V Intact sensation to sharp and light touch to the face  CN VII facial movements  symmetric  CN VIII not tested  CN IX, X no uvula deviation, symmetric rise of soft palate  CN XI 5/5 SCM and trapezius strength bilaterally  CN XII Midline tongue protrusion, symmetric L/R movements   Psychiatric:        Mood and Affect: Mood normal.        Behavior: Behavior normal.     ED Results / Procedures / Treatments   Labs (all labs ordered are listed, but only abnormal results are displayed) Labs Reviewed  CBC WITH DIFFERENTIAL/PLATELET - Abnormal; Notable for the following components:      Result Value   WBC 15.8 (*)    Platelets 506 (*)    Neutro Abs 12.2 (*)  Monocytes Absolute 1.8 (*)    All other components within normal limits  COMPREHENSIVE METABOLIC PANEL - Abnormal; Notable for the following components:   Chloride 96 (*)    Glucose, Bld 109 (*)    Total Protein 8.5 (*)    Albumin 3.2 (*)    ALT 67 (*)    All other components within normal limits  LACTIC ACID, PLASMA - Abnormal; Notable for the following components:   Lactic Acid, Venous 2.1 (*)    All other components within normal limits  SARS CORONAVIRUS 2 (TAT 6-24 HRS)  URINE CULTURE  CULTURE, BLOOD (SINGLE)  CULTURE, BLOOD (SINGLE)  LIPASE, BLOOD  URINALYSIS, ROUTINE W REFLEX MICROSCOPIC  TSH  LACTIC ACID, PLASMA  APTT  PROTIME-INR  HIV ANTIBODY (ROUTINE TESTING W REFLEX)  BASIC METABOLIC PANEL  CBC  HEPARIN LEVEL (UNFRACTIONATED)    EKG EKG Interpretation  Date/Time:  Tuesday July 20 2019 16:06:24 EDT Ventricular Rate:  103 PR Interval:    QRS Duration: 63 QT Interval:  323 QTC Calculation: 423 R Axis:   84 Text Interpretation: Sinus tachycardia Biatrial enlargement Anteroseptal infarct, age indeterminate no significant change since 2017 Confirmed by Sherwood Gambler [phone]) on 07/20/2019 4:16:30 PM   Radiology CT Chest W Contrast  Addendum Date: 07/20/2019   ADDENDUM REPORT: 07/20/2019 19:07 ADDENDUM: I discussed critical value/emergent results and the need for additional  follow-up imaging by telephone at the time of interpretation on 07/20/2019 at 7:03 pm with the patient's provider Dr. Gershon Cull, who verbally acknowledged these results. Electronically Signed   By: Revonda Humphrey   On: 07/20/2019 19:07   Result Date: 07/20/2019 CLINICAL DATA:  Left lower back pain radiating down the left leg for the past 2 weeks. Possible cavitary pulmonary lesion. EXAM: CT CHEST, ABDOMEN, AND PELVIS WITH CONTRAST TECHNIQUE: Multidetector CT imaging of the chest, abdomen and pelvis was performed following the standard protocol during bolus administration of intravenous contrast. Automatic exposure control utilized. CONTRAST:  13mL OMNIPAQUE IOHEXOL 300 MG/ML  SOLN COMPARISON:  None. Chest radiography acquired earlier on the same date. FINDINGS: CT CHEST FINDINGS Cardiovascular: Normal heart size. Four-vessel coronary calcification moderate along the LAD ostia. No thoracic aortic aneurysm. Patent central and lobar pulmonary arteries. Punctate central contrast filling defects in the right infrahilar segmental branches. Mediastinum/Nodes: Borderline enlarged right hilar noncalcified lymph node, 11 x 18 mm. Nonspecific 11 mm short axis diameter subcarinal and subcentimeter mediastinal lymph nodes. Lungs/Pleura: Moderate centrilobular emphysema with bilateral bronchiectasis most pronounced in the lung bases. Bulky aspirated material in the right mainstem bronchus. Right infrahilar spiculated 6 cm consolidation with multiple internal loculated fluid components and traversing air bronchograms and peripheral interlobular septal thickening corresponding to the prior radiographic abnormality, possibly infectious and/or malignant or ischemic. No associated cavitary lesion. Partially loculated small right pleural effusion. No left pleural effusion. Musculoskeletal: Moderate skeletal degenerative change. No cortical erosion or suspicious osseous lesion of the thoracic spine or ribcage or sternum., Partially  loculated along the right lateral and posterior chest wall. CT ABDOMEN PELVIS FINDINGS Hepatobiliary: An 11 mm hypervascular lesion in the right hepatic dome on axial series 2, image 52 and coronal series 7, image 72. A second hypervascular lesion in the posterior right hemi liver measuring 9 x 7 mm on coronal image 97 and axial image 67. A third hypervascular lesion measuring 15 x 11 mm at the junction of the right and left hemi liver on axial image 60 and coronal image 45. A fourth hypervascular lesion in the caudate lobe  measuring 11 x 8 mm on axial image 68 and coronal image 57. Borderline hepatomegaly with normal smooth hepatic contours. Pancreas: No acute pancreatitis or main ductal dilatation. Spleen: Normal. Adrenals/Urinary Tract: Normal adrenal glands. No apparent abnormality of the urinary bladder or bilateral ureters. Subcentimeter left renal cortical cysts, and otherwise normal bilateral kidneys. Stomach/Bowel: Mild nonspecific colitis of the proximal descending colon along the left flank without pericolonic abscess or bowel perforation. Several nondistended versus non distensible short segments in the sigmoid colon. No bowel obstruction. Probably decompressed appendix, sagittal image 73. Decompressed stomach and small bowel without an apparent mucosal abnormality or obstruction. Right indirect inguinal herniation of elongated mesentery, mesenteric vasculature, and small bowel loops through a wide 4.5 cm defect with extension into the scrotal sac, but without apparent strangulation or incarceration. Vascular/Lymphatic: Areas of noncontrast opacification in the left renal vein and inferior vena cava extending into the right cardiac atria which is probably secondary to contrast bolus timing given the unenhanced hepatic and more distal ileocaval veins. Infundibulum at the superior mesenteric artery ostium. Abdominal aorta minimal calcified atherosclerosis without aneurysmal dilatation. Nonspecific small  retroperitoneal lymph nodes. Reproductive: Prostatomegaly. Other: Mild dermal thickening overlying the sacrum without discrete ulceration or osseous erosion or abscess. Musculoskeletal: Moderate skeletal degenerative change and diffuse bone demineralization. A couple punctate sclerotic lesion in the hips and bony pelvis. An 8 mm sclerotic lesion in the right S1 segment similar to the July 08, 2019 spine radiography, but new since the May 28, 2004 spine radiography, concerning for an osteoblastic lesion. IMPRESSION: A mild uncomplicated colitis of the proximal descending colon. Short segments of nondistended versus non-distensible sigmoid colon. Correlation with colonoscopy recommended to exclude an underlying colonic malignancy. Right infrahilar spiculated lung mass with multiple internal cystic lesions, but no associated cavitation at this time. An infectious pulmonary abscess, malignant, or less likely ischemic etiologies are differential considerations. Small right partially loculated pleural effusion, possibly benign or malignant. Nonspecific mildly enlarged right hilar lymph node. Unenhanced CT chest recommended in 6-8 weeks to document any interval response to therapy. Tissue sampling could be considered. Nonocclusive peripheral pulmonary emboli in the right infrahilar region. Patent central pulmonary arteries without right heart strain. Several small hypervascular hepatic lesions, incompletely characterized on single-phase enhanced imaging measuring up to 15 mm diameter, possibly benign hemangiomas but a malignant etiology is not excluded. Borderline hepatomegaly without cirrhosis. Further characterization with MR abdomen without and with intravenous contrast liver protocol recommended. 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 Four-vessel coronary calcification.  Large right indirect inguinal herniation of mesentery and small bowel loops through a wide neck without strangulation or incarceration. Prostatomegaly. Centrilobular emphysema with bilateral bronchiectasis, aspiration, and aortic calcified atherosclerosis. Electronically Signed: By: Laurence Ferrariachel  [place] On: 07/20/2019 19:00   CT ABDOMEN PELVIS W CONTRAST  Addendum Date: 07/20/2019   ADDENDUM REPORT: 07/20/2019 19:07 ADDENDUM: I discussed critical value/emergent results and the need for additional follow-up imaging by telephone at the time of interpretation on 07/20/2019 at 7:03 pm with the patient's provider Dr. Landis GandyWilder, who verbally acknowledged these results. Electronically Signed   By: Laurence Ferrariachel  [place]   On: 07/20/2019 19:07   Result Date: 07/20/2019 CLINICAL DATA:  Left lower back pain radiating down the left leg for the past 2 weeks. Possible cavitary pulmonary lesion. EXAM: CT CHEST, ABDOMEN, AND PELVIS WITH CONTRAST TECHNIQUE: Multidetector CT imaging of the chest, abdomen and  pelvis was performed following the standard protocol during bolus administration of intravenous contrast. Automatic exposure control utilized. CONTRAST:  OMNIPAQUE IOHEXOL 300 MG/ML  SOLN COMPARISON:  None. Chest radiography acquired earlier on the same date. FINDINGS: CT CHEST FINDINGS Cardiovascular: Normal heart size. Four-vessel coronary calcification moderate along the LAD ostia. No thoracic aortic aneurysm. Patent central and lobar pulmonary arteries. Punctate central contrast filling defects in the right infrahilar segmental branches. Mediastinum/Nodes: Borderline enlarged right hilar noncalcified lymph node, 11 x 18 mm. Nonspecific 11 mm short axis diameter subcarinal and subcentimeter mediastinal lymph nodes. Lungs/Pleura: Moderate centrilobular emphysema with bilateral bronchiectasis most pronounced in the lung bases. Bulky aspirated material in the right mainstem bronchus. Right infrahilar spiculated 6 cm consolidation with  multiple internal loculated fluid components and traversing air bronchograms and peripheral interlobular septal thickening corresponding to the prior radiographic abnormality, possibly infectious and/or malignant or ischemic. No associated cavitary lesion. Partially loculated small right pleural effusion. No left pleural effusion. Musculoskeletal: Moderate skeletal degenerative change. No cortical erosion or suspicious osseous lesion of the thoracic spine or ribcage or sternum., Partially loculated along the right lateral and posterior chest wall. CT ABDOMEN PELVIS FINDINGS Hepatobiliary: An 11 mm hypervascular lesion in the right hepatic dome on axial series 2, image 52 and coronal series 7, image 72. A second hypervascular lesion in the posterior right hemi liver measuring 9 x 7 mm on coronal image 97 and axial image 67. A third hypervascular lesion measuring 15 x 11 mm at the junction of the right and left hemi liver on axial image 60 and coronal image 45. A fourth hypervascular lesion in the caudate lobe measuring 11 x 8 mm on axial image 68 and coronal image 57. Borderline hepatomegaly with normal smooth hepatic contours. Pancreas: No acute pancreatitis or main ductal dilatation. Spleen: Normal. Adrenals/Urinary Tract: Normal adrenal glands. No apparent abnormality of the urinary bladder or bilateral ureters. Subcentimeter left renal cortical cysts, and otherwise normal bilateral kidneys. Stomach/Bowel: Mild nonspecific colitis of the proximal descending colon along the left flank without pericolonic abscess or bowel perforation. Several nondistended versus non distensible short segments in the sigmoid colon. No bowel obstruction. Probably decompressed appendix, sagittal image 73. Decompressed stomach and small bowel without an apparent mucosal abnormality or obstruction. Right indirect inguinal herniation of elongated mesentery, mesenteric vasculature, and small bowel loops through a wide 4.5 cm defect with  extension into the scrotal sac, but without apparent strangulation or incarceration. Vascular/Lymphatic: Areas of noncontrast opacification in the left renal vein and inferior vena cava extending into the right cardiac atria which is probably secondary to contrast bolus timing given the unenhanced hepatic and more distal ileocaval veins. Infundibulum at the superior mesenteric artery ostium. Abdominal aorta minimal calcified atherosclerosis without aneurysmal dilatation. Nonspecific small retroperitoneal lymph nodes. Reproductive: Prostatomegaly. Other: Mild dermal thickening overlying the sacrum without discrete ulceration or osseous erosion or abscess. Musculoskeletal: Moderate skeletal degenerative change and diffuse bone demineralization. A couple punctate sclerotic lesion in the hips and bony pelvis. An 8 mm sclerotic lesion in the right S1 segment similar to the July 08, 2019 spine radiography, but new since the May 28, 2004 spine radiography, concerning for an osteoblastic lesion. IMPRESSION: A mild uncomplicated colitis of the proximal descending colon. Short segments of nondistended versus non-distensible sigmoid colon. Correlation with colonoscopy recommended to exclude an underlying colonic malignancy. Right infrahilar spiculated lung mass with multiple internal cystic lesions, but no associated cavitation at this time. An infectious pulmonary abscess, malignant, or less likely ischemic  etiologies are differential considerations. Small right partially loculated pleural effusion, possibly benign or malignant. Nonspecific mildly enlarged right hilar lymph node. Unenhanced CT chest recommended in 6-8 weeks to document any interval response to therapy. Tissue sampling could be considered. Nonocclusive peripheral pulmonary emboli in the right infrahilar region. Patent central pulmonary arteries without right heart strain. Several small hypervascular hepatic lesions, incompletely characterized on  single-phase enhanced imaging measuring up to 15 mm diameter, possibly benign hemangiomas but a malignant etiology is not excluded. Borderline hepatomegaly without cirrhosis. Further characterization with MR abdomen without and with intravenous contrast liver protocol recommended. 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 Four-vessel coronary calcification. Large right indirect inguinal herniation of mesentery and small bowel loops through a wide neck without strangulation or incarceration. Prostatomegaly. Centrilobular emphysema with bilateral bronchiectasis, aspiration, and aortic calcified atherosclerosis. Electronically Signed: By: Laurence Ferrari On: 07/20/2019 19:00   DG Chest Port 1 View  Result Date: 07/20/2019 CLINICAL DATA:  Cough and weakness. EXAM: PORTABLE CHEST 1 VIEW COMPARISON:  August 02, 2015 FINDINGS: An 8.8 cm x 4.3 cm mildly lobulated opacity is seen overlying the lateral aspect of the mid to lower right lung. This area contains a 2.3 cm x 1.6 cm focal lucency. There is no evidence of a pleural effusion or pneumothorax. The heart size and mediastinal contours are within normal limits. The visualized skeletal structures are unremarkable. IMPRESSION: 8.8 cm x 4.3 cm mildly lobulated opacity overlying the lateral aspect of the mid to lower right lung. While this may represent a cavitary pneumonia, a cavitary lung mass cannot be excluded. Correlation with chest CT is recommended. Electronically Signed   By: Aram Candela M.D.   On: 07/20/2019 16:32    Procedures .Critical Care Performed by: Gailen Shelter, PA Authorized by: Gailen Shelter, PA   Critical care provider statement:    Critical care time (minutes):  35   Critical care time was exclusive of:  Separately billable procedures and treating other patients and teaching time   Critical  care was necessary to treat or prevent imminent or life-threatening deterioration of the following conditions: Possible new cancer diagnosis, respiratory compromise secondary to pulmonary embolisms and lung mass concerning for abscess versus malignancy.   Critical care was time spent personally by me on the following activities:  Discussions with consultants, evaluation of patient's response to treatment, examination of patient, review of old charts, re-evaluation of patient's condition, pulse oximetry, ordering and review of radiographic studies, ordering and review of laboratory studies and ordering and performing treatments and interventions   I assumed direction of critical care for this patient from another provider in my specialty: no     (including critical care time)  Medications Ordered in ED Medications  sodium chloride (PF) 0.9 % injection (has no administration in time range)  meloxicam (MOBIC) tablet 7.5 mg (has no administration in time range)  feeding supplement (ENSURE ENLIVE) (ENSURE ENLIVE) liquid 237 mL (has no administration in time range)  lactated ringers infusion (has no administration in time range)  acetaminophen (TYLENOL) tablet 650 mg (has no administration in time range)    Or  acetaminophen (TYLENOL) suppository 650 mg (has no administration in time range)  ketorolac (TORADOL) 30 MG/ML injection 30 mg (has no administration in time range)  senna (SENOKOT) tablet 8.6 mg (has no administration in time range)  heparin bolus via  infusion 3,000 Units (has no administration in time range)    Followed by  heparin ADULT infusion 100 units/mL (25000 units/275mL sodium chloride 0.45%) (has no administration in time range)  0.9 %  sodium chloride infusion ( Intravenous Stopped 07/20/19 2001)  cefTRIAXone (ROCEPHIN) 1 g in sodium chloride 0.9 % 100 mL IVPB (0 g Intravenous Stopped 07/20/19 2000)  azithromycin (ZITHROMAX) tablet 500 mg (500 mg Oral Given 07/20/19 2000)  iohexol  (OMNIPAQUE) 300 MG/ML solution 100 mL (100 mLs Intravenous Contrast Given 07/20/19 1738)  sodium chloride 0.9 % bolus 1,000 mL (0 mLs Intravenous Stopped 07/20/19 2110)  ceFEPIme (MAXIPIME) 2 g in sodium chloride 0.9 % 100 mL IVPB (0 g Intravenous Stopped 07/20/19 2109)    ED Course  I have reviewed the triage vital signs and the nursing notes.  Pertinent labs & imaging results that were available during my care of the patient were reviewed by me and considered in my medical decision making (see chart for details).   Clinical Course as of Jul 19 2145  Tue Jul 20, 2019  1812 Patient had a CBC with leukocytosis with left shift.  Lactic of 2.1.  CMP with low albumin likely chronic.  Somewhat elevated ALT--likely also chronic.  Could be consistent with liver damage from alcohol use.   [WF]  1813 Age, coags, lipase within normal limits.  Urinalysis unremarkable.  TSH [WF]  1814 Chest x-ray independently reviewed myself.  Agree with radiology read. 8.8 cm x 4.3 cm mildly lobulated opacity overlying the lateral aspect of the mid to lower right lung. While this may represent a cavitary pneumonia, a cavitary lung mass cannot be excluded. Correlation with chest CT is recommended.    [WF]  1819 Lactic acidosis of 2.1.  Second lab pending.  Empiric antibiotic started with Rocephin and azithromycin.  Chest CT and abdomen pelvis with contrast pending.  Patient given 2 L normal saline bolus.   [WF]  1821 Radiologist called to inform of CT abdomen pelvis and chest with contrast results.  Lung mass is concerning for abscess versus malignancy.  Small subsegmental PE is present peripherally.  Evidence of colitis on exam as well.   [WF]    Clinical Course User Index [WF] Gailen Shelter, [place]   I discussed this case with my attending physician who cosigned this note including patient's presenting symptoms, physical exam, and planned diagnostics and interventions. Attending physician stated agreement with plan  or made changes to plan which were implemented.   Attending physician assessed patient at bedside.  CT results returned while working with Dr. Freida Busman. I discussed results with Dr. Freida Busman who recommended admission at this time for further evaluation.   Dr. Debby Bud the hospitalist service will the patient to hospital for further evaluation and treatment.  Appreciate his expert consultation.  MDM Rules/Calculators/A&P                      Patient admitted to hospital for numerous reasons see below.  Concern that patient has lung cancer with liver mets.  Patient has long history of smoking.  Suspect that this is a primary cancer.  He is cachectic and ill-appearing.  His lactic acidosis may be a result of his cancer or this may be a lung abscess.  He will need further evaluation for this.  Final Clinical Impression(s) / ED Diagnoses Final diagnoses:  Gluteal pain  Acute left-sided low back pain without sciatica  Community acquired pneumonia of right middle lobe of lung  Sepsis, due to unspecified organism, unspecified whether acute organ dysfunction present [facility])  Multiple subsegmental pulmonary emboli without acute cor pulmonale [facility])  Colitis  Lung mass    Rx / DC Orders ED Discharge Orders    None       Gailen Shelter, [place] 07/20/19 2147    Gailen Shelter, [place] 07/20/19 2149    Pricilla Loveless, MD 07/25/19 [phone]

## 2019-07-20 NOTE — H&P (Addendum)
History and Physical    Marcus Pace IPJ:[id number] DOB: 12/18/1955 DOA: 07/20/2019  PCP: Antony Blackbird, MD (Confirm with patient/family/NH records and if not entered, this has to be entered at [facility] point of entry) Patient coming from: Home  I have personally briefly reviewed patient's old medical records in [place]  Chief Complaint: Right side/flank pain  HPI: Marcus Pace is a 64 y.o. male with medical history significant of inguinal hernia right, h/o bacteremia in the past. He has not had regular medical care. Was seen recently in ED for left leg pain. For the past week or so he has had right chest/flank pain that is worse with movement, deep breathing, cough. He reports that he had chills and sweats over the weekend. He reports that he did have hemoptysis over the weekend, describing small clots of blood. He is a long term smoker, 20 packyears, but has never been diagnosed with chronic lung disease. On questioning he does admit to multiple minor respiratory infections during the year with productive cough. He denies any weight loss, having always been of slight stature. He does admit to a change in bowel habit with increasing constipation and small caliber stools but denies hematochezia. He does drink 60-80 ounces of beer daily and has for a long time. He presented to [facility]ED for evaluation. (For level 3, the HPI must include 4+ descriptors: Location, Quality, Severity, Duration, Timing, Context, modifying factors, associated signs/symptoms and/or status of 3+ chronic problems.)  (Please avoid self-populating past medical history here) (The initial 2-3 lines should be focused and good to copy and paste in the HPI section of the daily progress note).  ED Course: In the ED BP was stable, he was noted to be tachycardic and tachypneic on exam and had a low grade fever at 99.5. Lab revealed leukocytosis at 15.8 with normal diff. Initial lactic acid was 2.1. Code sepsis initiated and he  was given IV antibiotics and IVF. CXR revealed  8x4 cm lobulated lesion RLL. CT chest/CTA revealed emphysema, bronchiectasis, 6 cm spiculated mass RLL at surface of the lung with a small loculated pleural effusion concerning for abscess vs malignancy, non-occlusive infrahilar pulmonary embolic. CT abdomen with 4 lesions hemangioma vs mets, non-distended sigmoid colon. Patient referred to [facility] for admission to treat possible pulmonary infection and to workup lung lesion, liver lesions, sigmoid colon abnormality.  Review of Systems: As per HPI otherwise 10 point review of systems negative.  Unacceptable ROS statements: "10 systems reviewed," "Extensive" (without elaboration).  Acceptable ROS statements: "All others negative," "All others reviewed and are negative," and "All others unremarkable," with at [place] documented Can't double dip - if using for HPI can't use for ROS  Past Medical History:  Diagnosis Date  . Bacteremia    2016 or so with hospitalization  . Inguinal hernia recurrent unilateral    herniation of mesentary and loops of bowel to right scrotal sac, 4.5 cm defect    History reviewed. No pertinent surgical history.   reports that he has been smoking. He has been smoking about 1.00 pack per day. He has never used smokeless tobacco. He reports current alcohol use. He reports that he does not use drugs.  No Known Allergies  No family history on file. Unacceptable: Noncontributory, unremarkable, or negative. Acceptable: Family history reviewed and not pertinent (If you reviewed it)  Prior to Admission medications   Medication Sig Start Date End Date Taking? Authorizing Provider  ibuprofen (ADVIL) 400 MG tablet Take  400 mg by mouth every 6 (six) hours as needed for moderate pain.   Yes [provider]  feeding supplement, ENSURE ENLIVE, (ENSURE ENLIVE) LIQD Take 237 mLs by mouth daily. Patient not taking: Reported on 12/22/2017 08/04/15   Maretta BeesGhimire, Shanker M, MD   meloxicam (MOBIC) 7.5 MG tablet Take 1 tablet (7.5 mg total) by mouth daily. 07/08/19   Wallis BambergMani, Mario, PA-C  tiZANidine (ZANAFLEX) 4 MG tablet Take 1 tablet (4 mg total) by mouth at bedtime as needed for muscle spasms. 07/08/19   Wallis BambergMani, Mario, PA-C    Physical Exam: Vitals:   07/20/19 2130 07/20/19 2200 07/20/19 2201 07/20/19 2208  BP: 138/83 132/79 132/79 132/79  Pulse: 87  90 94  Resp: (!) 36 (!) 38 (!) 33 (!) 36  Temp:      TempSrc:      SpO2: 98%  99% 97%  Height:        Constitutional: NAD, calm, comfortable Vitals:   07/20/19 2130 07/20/19 2200 07/20/19 2201 07/20/19 2208  BP: 138/83 132/79 132/79 132/79  Pulse: 87  90 94  Resp: (!) 36 (!) 38 (!) 33 (!) 36  Temp:      TempSrc:      SpO2: 98%  99% 97%  Height:       General -  Slender man in no distress Eyes: PERRL, lids and conjunctivae normal ENMT: Mucous membranes are moist. Posterior pharynx clear of any exudate or lesions.Normal dentition but missing several teeth and also has noticeable cavities.  Neck: normal, supple, no masses, no thyromegaly Respiratory:Normal respiratory effort. No accessory muscle use. Good air movement with decreased diaphramatic excursion. Rales at right axillary line Cardiovascular: Regular rate and rhythm, no murmurs / rubs / gallops. No extremity edema. 2+ pedal pulses. No carotid bruits.  Abdomen: no tenderness, no masses palpated. No hepatosplenomegaly. Bowel sounds positive. Large right inquinal hernia and large mass to right scrotum that is soft and non-tender Musculoskeletal: no clubbing / cyanosis. No joint deformity upper and lower extremities. Good ROM, no contractures. Normal muscle tone.  Skin: no rashes, lesions, ulcers. No induration Neurologic: CN 2-12 grossly intact. Sensation intact, DTR normal. Strength 5/5 in all 4.  Psychiatric: Normal judgment and insight. Alert and oriented x 3. Normal mood.   (Anything < 9 systems with 2 bullets each down codes to level 1) (If patient  refuses exam can't bill higher level) (Make sure to document decubitus ulcers present on admission -- if possible -- and whether patient has chronic indwelling catheter at time of admission)  Labs on Admission: I have personally reviewed following labs and imaging studies  CBC: Recent Labs  Lab 07/20/19 1623  WBC 15.8*  NEUTROABS 12.2*  HGB 15.7  HCT 47.4  MCV 86.0  PLT 506*   Basic Metabolic Panel: Recent Labs  Lab 07/20/19 1623  NA 135  K 3.7  CL 96*  CO2 27  GLUCOSE 109*  BUN 9  CREATININE 0.84  CALCIUM 9.0   GFR: Estimated Creatinine Clearance: 67.3 mL/min (by C-G formula based on SCr of 0.84 mg/dL). Liver Function Tests: Recent Labs  Lab 07/20/19 1623  AST 38  ALT 67*  ALKPHOS 106  BILITOT 1.0  PROT 8.5*  ALBUMIN 3.2*   Recent Labs  Lab 07/20/19 1623  LIPASE 15   No results for input(s): AMMONIA in the last 168 hours. Coagulation Profile: Recent Labs  Lab 07/20/19 1623  INR 1.1   Cardiac Enzymes: No results for input(s): CKTOTAL, CKMB, CKMBINDEX, TROPONINI  in the last 168 hours. BNP (last 3 results) No results for input(s): PROBNP in the last 8760 hours. HbA1C: No results for input(s): HGBA1C in the last 72 hours. CBG: No results for input(s): GLUCAP in the last 168 hours. Lipid Profile: No results for input(s): CHOL, HDL, LDLCALC, TRIG, CHOLHDL, LDLDIRECT in the last 72 hours. Thyroid Function Tests: Recent Labs    07/20/19 1623  TSH 0.993   Anemia Panel: No results for input(s): VITAMINB12, FOLATE, FERRITIN, TIBC, IRON, RETICCTPCT in the last 72 hours. Urine analysis:    Component Value Date/Time   COLORURINE YELLOW 07/20/2019 1555   APPEARANCEUR CLEAR 07/20/2019 1555   LABSPEC 1.013 07/20/2019 1555   PHURINE 7.0 07/20/2019 1555   GLUCOSEU NEGATIVE 07/20/2019 1555   HGBUR NEGATIVE 07/20/2019 1555   BILIRUBINUR NEGATIVE 07/20/2019 1555   KETONESUR NEGATIVE 07/20/2019 1555   PROTEINUR NEGATIVE 07/20/2019 1555   NITRITE NEGATIVE  07/20/2019 1555   LEUKOCYTESUR NEGATIVE 07/20/2019 1555    Radiological Exams on Admission: CT Chest W Contrast  Addendum Date: 07/20/2019   ADDENDUM REPORT: 07/20/2019 19:07 ADDENDUM: I discussed critical value/emergent results and the need for additional follow-up imaging by telephone at the time of interpretation on 07/20/2019 at 7:03 pm with the patient's provider Dr. Landis Gandy, who verbally acknowledged these results. Electronically Signed   By: Laurence Ferrari   On: 07/20/2019 19:07   Result Date: 07/20/2019 CLINICAL DATA:  Left lower back pain radiating down the left leg for the past 2 weeks. Possible cavitary pulmonary lesion. EXAM: CT CHEST, ABDOMEN, AND PELVIS WITH CONTRAST TECHNIQUE: Multidetector CT imaging of the chest, abdomen and pelvis was performed following the standard protocol during bolus administration of intravenous contrast. Automatic exposure control utilized. CONTRAST:  OMNIPAQUE IOHEXOL 300 MG/ML  SOLN COMPARISON:  None. Chest radiography acquired earlier on the same date. FINDINGS: CT CHEST FINDINGS Cardiovascular: Normal heart size. Four-vessel coronary calcification moderate along the LAD ostia. No thoracic aortic aneurysm. Patent central and lobar pulmonary arteries. Punctate central contrast filling defects in the right infrahilar segmental branches. Mediastinum/Nodes: Borderline enlarged right hilar noncalcified lymph node, 11 x 18 mm. Nonspecific 11 mm short axis diameter subcarinal and subcentimeter mediastinal lymph nodes. Lungs/Pleura: Moderate centrilobular emphysema with bilateral bronchiectasis most pronounced in the lung bases. Bulky aspirated material in the right mainstem bronchus. Right infrahilar spiculated 6 cm consolidation with multiple internal loculated fluid components and traversing air bronchograms and peripheral interlobular septal thickening corresponding to the prior radiographic abnormality, possibly infectious and/or malignant or ischemic. No  associated cavitary lesion. Partially loculated small right pleural effusion. No left pleural effusion. Musculoskeletal: Moderate skeletal degenerative change. No cortical erosion or suspicious osseous lesion of the thoracic spine or ribcage or sternum., Partially loculated along the right lateral and posterior chest wall. CT ABDOMEN PELVIS FINDINGS Hepatobiliary: An 11 mm hypervascular lesion in the right hepatic dome on axial series 2, image 52 and coronal series 7, image 72. A second hypervascular lesion in the posterior right hemi liver measuring 9 x 7 mm on coronal image 97 and axial image 67. A third hypervascular lesion measuring 15 x 11 mm at the junction of the right and left hemi liver on axial image 60 and coronal image 45. A fourth hypervascular lesion in the caudate lobe measuring 11 x 8 mm on axial image 68 and coronal image 57. Borderline hepatomegaly with normal smooth hepatic contours. Pancreas: No acute pancreatitis or main ductal dilatation. Spleen: Normal. Adrenals/Urinary Tract: Normal adrenal glands. No apparent abnormality of the  urinary bladder or bilateral ureters. Subcentimeter left renal cortical cysts, and otherwise normal bilateral kidneys. Stomach/Bowel: Mild nonspecific colitis of the proximal descending colon along the left flank without pericolonic abscess or bowel perforation. Several nondistended versus non distensible short segments in the sigmoid colon. No bowel obstruction. Probably decompressed appendix, sagittal image 73. Decompressed stomach and small bowel without an apparent mucosal abnormality or obstruction. Right indirect inguinal herniation of elongated mesentery, mesenteric vasculature, and small bowel loops through a wide 4.5 cm defect with extension into the scrotal sac, but without apparent strangulation or incarceration. Vascular/Lymphatic: Areas of noncontrast opacification in the left renal vein and inferior vena cava extending into the right cardiac atria which  is probably secondary to contrast bolus timing given the unenhanced hepatic and more distal ileocaval veins. Infundibulum at the superior mesenteric artery ostium. Abdominal aorta minimal calcified atherosclerosis without aneurysmal dilatation. Nonspecific small retroperitoneal lymph nodes. Reproductive: Prostatomegaly. Other: Mild dermal thickening overlying the sacrum without discrete ulceration or osseous erosion or abscess. Musculoskeletal: Moderate skeletal degenerative change and diffuse bone demineralization. A couple punctate sclerotic lesion in the hips and bony pelvis. An 8 mm sclerotic lesion in the right S1 segment similar to the July 08, 2019 spine radiography, but new since the May 28, 2004 spine radiography, concerning for an osteoblastic lesion. IMPRESSION: A mild uncomplicated colitis of the proximal descending colon. Short segments of nondistended versus non-distensible sigmoid colon. Correlation with colonoscopy recommended to exclude an underlying colonic malignancy. Right infrahilar spiculated lung mass with multiple internal cystic lesions, but no associated cavitation at this time. An infectious pulmonary abscess, malignant, or less likely ischemic etiologies are differential considerations. Small right partially loculated pleural effusion, possibly benign or malignant. Nonspecific mildly enlarged right hilar lymph node. Unenhanced CT chest recommended in 6-8 weeks to document any interval response to therapy. Tissue sampling could be considered. Nonocclusive peripheral pulmonary emboli in the right infrahilar region. Patent central pulmonary arteries without right heart strain. Several small hypervascular hepatic lesions, incompletely characterized on single-phase enhanced imaging measuring up to 15 mm diameter, possibly benign hemangiomas but a malignant etiology is not excluded. Borderline hepatomegaly without cirrhosis. Further characterization with MR abdomen without and with  intravenous contrast liver protocol recommended. 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 Four-vessel coronary calcification. Large right indirect inguinal herniation of mesentery and small bowel loops through a wide neck without strangulation or incarceration. Prostatomegaly. Centrilobular emphysema with bilateral bronchiectasis, aspiration, and aortic calcified atherosclerosis. Electronically Signed: By: Laurence Ferrari On: 07/20/2019 19:00   CT ABDOMEN PELVIS W CONTRAST  Addendum Date: 07/20/2019   ADDENDUM REPORT: 07/20/2019 19:07 ADDENDUM: I discussed critical value/emergent results and the need for additional follow-up imaging by telephone at the time of interpretation on 07/20/2019 at 7:03 pm with the patient's provider Dr. Landis Gandy, who verbally acknowledged these results. Electronically Signed   By: Laurence Ferrari   On: 07/20/2019 19:07   Result Date: 07/20/2019 CLINICAL DATA:  Left lower back pain radiating down the left leg for the past 2 weeks. Possible cavitary pulmonary lesion. EXAM: CT CHEST, ABDOMEN, AND PELVIS WITH CONTRAST TECHNIQUE: Multidetector CT imaging of the chest, abdomen and pelvis was performed following the standard protocol during bolus administration of intravenous contrast. Automatic exposure control utilized. CONTRAST:  OMNIPAQUE IOHEXOL 300 MG/ML  SOLN COMPARISON:  None. Chest radiography acquired earlier on the same date. FINDINGS: CT CHEST  FINDINGS Cardiovascular: Normal heart size. Four-vessel coronary calcification moderate along the LAD ostia. No thoracic aortic aneurysm. Patent central and lobar pulmonary arteries. Punctate central contrast filling defects in the right infrahilar segmental branches. Mediastinum/Nodes: Borderline enlarged right hilar noncalcified lymph node, 11 x 18 mm. Nonspecific 11 mm short axis diameter  subcarinal and subcentimeter mediastinal lymph nodes. Lungs/Pleura: Moderate centrilobular emphysema with bilateral bronchiectasis most pronounced in the lung bases. Bulky aspirated material in the right mainstem bronchus. Right infrahilar spiculated 6 cm consolidation with multiple internal loculated fluid components and traversing air bronchograms and peripheral interlobular septal thickening corresponding to the prior radiographic abnormality, possibly infectious and/or malignant or ischemic. No associated cavitary lesion. Partially loculated small right pleural effusion. No left pleural effusion. Musculoskeletal: Moderate skeletal degenerative change. No cortical erosion or suspicious osseous lesion of the thoracic spine or ribcage or sternum., Partially loculated along the right lateral and posterior chest wall. CT ABDOMEN PELVIS FINDINGS Hepatobiliary: An 11 mm hypervascular lesion in the right hepatic dome on axial series 2, image 52 and coronal series 7, image 72. A second hypervascular lesion in the posterior right hemi liver measuring 9 x 7 mm on coronal image 97 and axial image 67. A third hypervascular lesion measuring 15 x 11 mm at the junction of the right and left hemi liver on axial image 60 and coronal image 45. A fourth hypervascular lesion in the caudate lobe measuring 11 x 8 mm on axial image 68 and coronal image 57. Borderline hepatomegaly with normal smooth hepatic contours. Pancreas: No acute pancreatitis or main ductal dilatation. Spleen: Normal. Adrenals/Urinary Tract: Normal adrenal glands. No apparent abnormality of the urinary bladder or bilateral ureters. Subcentimeter left renal cortical cysts, and otherwise normal bilateral kidneys. Stomach/Bowel: Mild nonspecific colitis of the proximal descending colon along the left flank without pericolonic abscess or bowel perforation. Several nondistended versus non distensible short segments in the sigmoid colon. No bowel obstruction. Probably  decompressed appendix, sagittal image 73. Decompressed stomach and small bowel without an apparent mucosal abnormality or obstruction. Right indirect inguinal herniation of elongated mesentery, mesenteric vasculature, and small bowel loops through a wide 4.5 cm defect with extension into the scrotal sac, but without apparent strangulation or incarceration. Vascular/Lymphatic: Areas of noncontrast opacification in the left renal vein and inferior vena cava extending into the right cardiac atria which is probably secondary to contrast bolus timing given the unenhanced hepatic and more distal ileocaval veins. Infundibulum at the superior mesenteric artery ostium. Abdominal aorta minimal calcified atherosclerosis without aneurysmal dilatation. Nonspecific small retroperitoneal lymph nodes. Reproductive: Prostatomegaly. Other: Mild dermal thickening overlying the sacrum without discrete ulceration or osseous erosion or abscess. Musculoskeletal: Moderate skeletal degenerative change and diffuse bone demineralization. A couple punctate sclerotic lesion in the hips and bony pelvis. An 8 mm sclerotic lesion in the right S1 segment similar to the July 08, 2019 spine radiography, but new since the May 28, 2004 spine radiography, concerning for an osteoblastic lesion. IMPRESSION: A mild uncomplicated colitis of the proximal descending colon. Short segments of nondistended versus non-distensible sigmoid colon. Correlation with colonoscopy recommended to exclude an underlying colonic malignancy. Right infrahilar spiculated lung mass with multiple internal cystic lesions, but no associated cavitation at this time. An infectious pulmonary abscess, malignant, or less likely ischemic etiologies are differential considerations. Small right partially loculated pleural effusion, possibly benign or malignant. Nonspecific mildly enlarged right hilar lymph node. Unenhanced CT chest recommended in 6-8 weeks to document any interval  response to therapy. Tissue sampling could be  considered. Nonocclusive peripheral pulmonary emboli in the right infrahilar region. Patent central pulmonary arteries without right heart strain. Several small hypervascular hepatic lesions, incompletely characterized on single-phase enhanced imaging measuring up to 15 mm diameter, possibly benign hemangiomas but a malignant etiology is not excluded. Borderline hepatomegaly without cirrhosis. Further characterization with MR abdomen without and with intravenous contrast liver protocol recommended. 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 Four-vessel coronary calcification. Large right indirect inguinal herniation of mesentery and small bowel loops through a wide neck without strangulation or incarceration. Prostatomegaly. Centrilobular emphysema with bilateral bronchiectasis, aspiration, and aortic calcified atherosclerosis. Electronically Signed: By: Laurence Ferrari On: 07/20/2019 19:00   DG Chest Port 1 View  Result Date: 07/20/2019 CLINICAL DATA:  Cough and weakness. EXAM: PORTABLE CHEST 1 VIEW COMPARISON:  August 02, 2015 FINDINGS: An 8.8 cm x 4.3 cm mildly lobulated opacity is seen overlying the lateral aspect of the mid to lower right lung. This area contains a 2.3 cm x 1.6 cm focal lucency. There is no evidence of a pleural effusion or pneumothorax. The heart size and mediastinal contours are within normal limits. The visualized skeletal structures are unremarkable. IMPRESSION: 8.8 cm x 4.3 cm mildly lobulated opacity overlying the lateral aspect of the mid to lower right lung. While this may represent a cavitary pneumonia, a cavitary lung mass cannot be excluded. Correlation with chest CT is recommended. Electronically Signed   By: Aram Candela M.D.   On: 07/20/2019 16:32    EKG: Independently reviewed. Sinus  tach, biatrial enlargment, old inferior change - no change from 2017  Assessment/Plan Active Problems:   Lesion of right lung   Pulmonary abscess (HCC)   Pneumonia due to organism   Pulmonary emboli (HCC)   Inguinal hernia, right   Emphysema of lung (HCC)   Bronchiectasis (HCC)   Sigmoid thickening   RLL pneumonia  (please populate well all problems here in Problem List. (For example, if patient is on BP meds at home and you resume or decide to hold them, it is a problem that needs to be her. Same for CAD, COPD, HLD and so on)   1. Pulmonary - complex presentation: emphysema and bronchiectasis. Large RLL lesion with loculation representing walled of abscess vs malignancy. Loculated right pleural effusion. Patient does report hemoptysis. Admits to mild-moderated SOB/DOE over the past several weeks.He is febrile. No distinct infiltrates on imaging but cannot rule out pneumonia vs bascess.  Plan Med-surg admit ` Abx - continue cefipime*, azithromycin  F/u CBC in AM  IR consultation needed in AM for possible percutaneous needle biopsy of RLL   Mass  May need pulmonary consult vs oncology consult  * after discussing coverage with Pharmacy will d/c cefipime and cover with Zosyn   for better anerobic coverage.  2. PE - non-occlusive PE on CT.  Plan Full Dose heparin - pharmacy consult  LE Venous dopplers  3. Liver lesions - mets vs hemangioma. Also concern for cirrhosis  Plan MR liver w/wo contrast for further assessment  4. GI - in addition to possible cirrhosis patient with question of non-distensible sigmoid colon. He does give a history of change in stool caliber  Plan GI consultation for possible endoscopy to r/o colon cancer.   DVT prophylaxis: full dose heparin (Lovenox/Heparin/SCD's/anticoagulated/None (if comfort care) Code Status: full code (Full/Partial (specify details) Family Communication: spoke with sister explaining  working dx and tx plan (Specify name, relationship.  Do not write "discussed with patient". Specify tel # if discussed over the phone) Disposition Plan: home when stable (specify when and where you expect patient to be discharged) Consults called: IR - will need to call in AM, GI - will need to call in AM (with names) Admission status:  inpatient (inpatient / obs / tele / medical floor / SDU)   Illene Regulus MD [facility] Pager [phone]  If 7PM-7AM, please contact night-coverage [email or web address] Password [facility]  07/20/2019, 10:09 PM

## 2019-07-20 NOTE — Progress Notes (Signed)
ANTICOAGULATION CONSULT NOTE - Initial Consult  Pharmacy Consult for Heparin Indication: pulmonary embolus  No Known Allergies  Patient Measurements: Height: 6\' 1"  (185.4 cm) IBW/kg (Calculated) : 79.9 Heparin Dosing Weight: actual body weight  Vital Signs: Temp: 99.5 F (37.5 C) (03/16 1420) Temp Source: Oral (03/16 1420) BP: 138/81 (03/16 2100) Pulse Rate: 83 (03/16 2100)  Labs: Recent Labs    07/20/19 1623  HGB 15.7  HCT 47.4  PLT 506*  APTT 34  LABPROT 13.9  INR 1.1  CREATININE 0.84    Estimated Creatinine Clearance: 67.3 mL/min (by C-G formula based on SCr of 0.84 mg/dL).   Medical History: History reviewed. No pertinent past medical history.    Medications:  No oral anticoagulation PTA  Assessment:  64 yr male presents with back pain radiating down leg.  PMH significant for tobacco and alcohol abuse.  CTAngio = + pulmonary embolism  Goal of Therapy:  Heparin level 0.3-0.7 units/ml Monitor platelets by anticoagulation protocol: Yes   Plan:   Heparin 3000 units IV bolus x 1 followed by heparin gtt @ 900 units/hr  Check heparin level 6 hr after heparin gtt started  Follow heparin level and CBC daily while on heparin gtt  69, PharmD 07/20/2019,9:23 PM

## 2019-07-20 NOTE — ED Notes (Signed)
Patient transported to CT 

## 2019-07-20 NOTE — ED Triage Notes (Addendum)
Per EMS, patient from home, c/o left lower back pain radiating down left leg x2 weeks. Seen at [facility] for same. Reports followed up with urology who ruled out kidney stones. Patient also reports cough x1 week. Ambulatory without difficulty. Patient reports pain worsens with movement.

## 2019-07-21 ENCOUNTER — Other Ambulatory Visit: Payer: Self-pay

## 2019-07-21 ENCOUNTER — Encounter (HOSPITAL_COMMUNITY): Payer: Self-pay | Admitting: Internal Medicine

## 2019-07-21 ENCOUNTER — Inpatient Hospital Stay (HOSPITAL_COMMUNITY): Payer: Self-pay

## 2019-07-21 DIAGNOSIS — I2699 Other pulmonary embolism without acute cor pulmonale: Secondary | ICD-10-CM

## 2019-07-21 DIAGNOSIS — K4090 Unilateral inguinal hernia, without obstruction or gangrene, not specified as recurrent: Secondary | ICD-10-CM

## 2019-07-21 DIAGNOSIS — J851 Abscess of lung with pneumonia: Secondary | ICD-10-CM

## 2019-07-21 LAB — EXPECTORATED SPUTUM ASSESSMENT W GRAM STAIN, RFLX TO RESP C

## 2019-07-21 LAB — CBC
HCT: 39.7 % (ref 39.0–52.0)
Hemoglobin: 13.2 g/dL (ref 13.0–17.0)
MCH: 28.8 pg (ref 26.0–34.0)
MCHC: 33.2 g/dL (ref 30.0–36.0)
MCV: 86.5 fL (ref 80.0–100.0)
Platelets: 427 10*3/uL — ABNORMAL HIGH (ref 150–400)
RBC: 4.59 MIL/uL (ref 4.22–5.81)
RDW: 13.8 % (ref 11.5–15.5)
WBC: 14.2 10*3/uL — ABNORMAL HIGH (ref 4.0–10.5)
nRBC: 0 % (ref 0.0–0.2)

## 2019-07-21 LAB — HEPARIN LEVEL (UNFRACTIONATED)
Heparin Unfractionated: <0.1 IU/mL — ABNORMAL LOW (ref 0.30–0.70)
Heparin Unfractionated: <0.1 IU/mL — ABNORMAL LOW (ref 0.30–0.70)

## 2019-07-21 LAB — BASIC METABOLIC PANEL
Anion gap: 8 (ref 5–15)
BUN: 11 mg/dL (ref 8–23)
CO2: 24 mmol/L (ref 22–32)
Calcium: 8.4 mg/dL — ABNORMAL LOW (ref 8.9–10.3)
Chloride: 104 mmol/L (ref 98–111)
Creatinine, Ser: 0.78 mg/dL (ref 0.61–1.24)
GFR calc Af Amer: >60 mL/min (ref >60)
GFR calc non Af Amer: >60 mL/min (ref >60)
Glucose, Bld: 116 mg/dL — ABNORMAL HIGH (ref 70–99)
Potassium: 4.1 mmol/L (ref 3.5–5.1)
Sodium: 136 mmol/L (ref 135–145)

## 2019-07-21 LAB — HIV ANTIBODY (ROUTINE TESTING W REFLEX): HIV Screen 4th Generation wRfx: NONREACTIVE

## 2019-07-21 LAB — MRSA PCR SCREENING: MRSA by PCR: NEGATIVE

## 2019-07-21 MED ORDER — GADOBUTROL 1 MMOL/ML IV SOLN
5.0000 mL | Freq: Once | INTRAVENOUS | Status: AC | PRN
  Administered 2019-07-21: 5 mL via INTRAVENOUS

## 2019-07-21 MED ORDER — VANCOMYCIN HCL 750 MG/150ML IV SOLN
750.0000 mg | Freq: Two times a day (BID) | INTRAVENOUS | Status: DC
  Administered 2019-07-22: 750 mg via INTRAVENOUS
  Filled 2019-07-21 (×2): qty 150

## 2019-07-21 MED ORDER — VANCOMYCIN HCL 1250 MG/250ML IV SOLN
1250.0000 mg | Freq: Once | INTRAVENOUS | Status: AC
  Administered 2019-07-21: 1250 mg via INTRAVENOUS
  Filled 2019-07-21: qty 250

## 2019-07-21 MED ORDER — PIPERACILLIN-TAZOBACTAM 3.375 G IVPB
3.3750 g | Freq: Three times a day (TID) | INTRAVENOUS | Status: DC
  Administered 2019-07-21 – 2019-07-23 (×8): 3.375 g via INTRAVENOUS
  Filled 2019-07-21 (×9): qty 50

## 2019-07-21 MED ORDER — SODIUM CHLORIDE 0.9 % IV SOLN: 2.0000 g | Freq: Three times a day (TID) | INTRAVENOUS | Status: DC

## 2019-07-21 MED ORDER — OXYCODONE-ACETAMINOPHEN 5-325 MG PO TABS
1.0000 | ORAL_TABLET | Freq: Four times a day (QID) | ORAL | Status: DC | PRN
  Administered 2019-07-21 – 2019-07-23 (×4): 2 via ORAL
  Filled 2019-07-21 (×4): qty 2

## 2019-07-21 MED ORDER — HEPARIN BOLUS VIA INFUSION
1750.0000 [IU] | INTRAVENOUS | Status: DC
  Administered 2019-07-21: 1750 [IU] via INTRAVENOUS
  Filled 2019-07-21: qty 1750

## 2019-07-21 MED ORDER — TRAZODONE HCL 50 MG PO TABS
50.0000 mg | ORAL_TABLET | Freq: Every evening | ORAL | Status: DC | PRN
  Administered 2019-07-21: 50 mg via ORAL
  Filled 2019-07-21: qty 1

## 2019-07-21 MED ORDER — NICOTINE 14 MG/24HR TD PT24
14.0000 mg | MEDICATED_PATCH | Freq: Every day | TRANSDERMAL | Status: DC
  Administered 2019-07-21 – 2019-07-23 (×3): 14 mg via TRANSDERMAL
  Filled 2019-07-21 (×3): qty 1

## 2019-07-21 MED ORDER — HEPARIN (PORCINE) 25000 UT/250ML-% IV SOLN
1500.0000 [IU]/h | INTRAVENOUS | Status: DC
  Administered 2019-07-23: 1500 [IU]/h via INTRAVENOUS
  Filled 2019-07-21 (×2): qty 250

## 2019-07-21 MED ORDER — HEPARIN BOLUS VIA INFUSION
4000.0000 [IU] | Freq: Once | INTRAVENOUS | Status: AC
  Administered 2019-07-21: 4000 [IU] via INTRAVENOUS
  Filled 2019-07-21: qty 4000

## 2019-07-21 NOTE — Progress Notes (Signed)
ANTICOAGULATION CONSULT NOTE  Pharmacy Consult for Heparin Indication: pulmonary embolus  No Known Allergies  Patient Measurements: Height: 6\' 1"  (185.4 cm) Weight: 128 lb 4.9 oz (58.2 kg) IBW/kg (Calculated) : 79.9 Heparin Dosing Weight: actual body weight  Vital Signs: Temp: 98.2 F (36.8 C) (03/17 0442) Temp Source: Oral (03/17 0442) BP: 131/79 (03/17 0442) Pulse Rate: 89 (03/17 0442)  Labs: Recent Labs    07/20/19 1623 07/21/19 0553  HGB 15.7 13.2  HCT 47.4 39.7  PLT 506* 427*  APTT 34  --   LABPROT 13.9  --   INR 1.1  --   HEPARINUNFRC  --  <0.10*  CREATININE 0.84 0.78    Estimated Creatinine Clearance: 78.8 mL/min (by C-G formula based on SCr of 0.78 mg/dL).   Medical History: Past Medical History:  Diagnosis Date  . Bacteremia    2016 or so with hospitalization  . 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  PMH significant for tobacco and alcohol abuse.  CTAngio = + pulmonary embolism  Today, 07/21/19:  Initial heparin level < 0.1 units/mL, subtherapeutic  CBC: Hgb WNL, Pltc elevated at 427K  RN confirms heparin infusion running at specified rate and no interruptions in therapy  No bleeding noted per  nursing  Goal of Therapy:  Heparin level 0.3-0.7 units/ml Monitor platelets by anticoagulation protocol: Yes   Plan:   Heparin 1750 units IV bolus x 1, then increase heparin infusion to 1100 units/hr  Heparin level 6 hours after rate change  Daily CBC, heparin level  Monitor closely for s/sx of bleeding  F/u LE venous dopplers   07/23/19, PharmD, BCPS Clinical Pharmacist  07/21/2019,7:06 AM

## 2019-07-21 NOTE — Progress Notes (Signed)
Pharmacy Antibiotic Note  Marcus Pace is a 64 y.o. male admitted on 07/20/2019 with pneumonia.  Pharmacy has been consulted for vancomycin dosing.  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 Patient currently on azithromycin and piperacillin/tazobactam, vancomycin now being initiated.   Today, 07/21/19  WBC 14.2  SCr 0.8, CrCl >70 mL/min  Afebrile  Day #2 of IV antibiotics.  Plan:  Piperacillin/tazobactam 3.375 g IV q8h EI + azithromycin per MD  Vancomycin 1250 mg LD followed by 750 mg IV q12h  Goal vancomycin AUC 400-550  Ordered MRSA PCR, recommend d/c vancomycin if negative and respiratory is only suspected source of infection  Monitor renal function closely, f/u culture data  Height: 6\' 1"  (185.4 cm) Weight: 128 lb 4.9 oz (58.2 kg) IBW/kg (Calculated) : 79.9  Temp (24hrs), Avg:98.5 F (36.9 C), Min:97.9 F (36.6 C), Max:99.5 F (37.5 C)  Recent Labs  Lab 07/20/19 1623 07/20/19 2003 07/21/19 0553  WBC 15.8*  --  14.2*  CREATININE 0.84  --  0.78  LATICACIDVEN 2.1* 1.1  --     Estimated Creatinine Clearance: 78.8 mL/min (by C-G formula based on SCr of 0.78 mg/dL).    No Known Allergies  Antimicrobials this admission: vancomycin 3/17 >>  Ceftriaxone and cefepime x1 in ED 3/16  Azithromycin 3/17 >>  Dose adjus/tments this admission:  Microbiology results: 3/16 BCx: ngtd 3/16 MRSA PCR: Sent 3/16 UCx:  4/16, PharmD 07/21/2019 12:32 PM

## 2019-07-21 NOTE — Progress Notes (Signed)
Lower extremity venous has been completed.   Preliminary results in CV Proc.   Blanch Media 07/21/2019 9:49 AM

## 2019-07-21 NOTE — Plan of Care (Signed)

## 2019-07-21 NOTE — Progress Notes (Addendum)
PROGRESS NOTE  [name]:[id number] DOB: Dec 30, 1955 DOA: 07/20/2019 PCP: Cain Saupe, MD   LOS: 1 day   Brief narrative: As per HPI,  Marcus Pace is a 64 y.o. male with medical history significant of inguinal hernia right, h/o bacteremia in the past, who was recently seen in the ED for  left leg pain. For the past week or so he has had right chest/flank pain which was worse with movement, deep breathing, cough. He reported chills and sweats, hemoptysis over the weekend, describing small clots of blood. He is a long term smoker, 20 packyears, but has never been diagnosed with chronic lung disease.  He denied any weight loss, having always been small stature. He does admit to a change in bowel habit with increasing constipation and small caliber stools but denies hematochezia. He does drink 60-80 ounces of beer daily and has for a long time. He presented to [facility] for evaluation.  In the ED, BP was stable, he was noted to be tachycardic and tachypneic on exam and had a low grade fever at 99.5. Lab revealed leukocytosis at 15.8 with normal diff. Initial lactic acid was 2.1. Code sepsis initiated and he was given IV antibiotics and IVF. CXR revealed  8x4 cm lobulated lesion RLL. CT chest/CTA revealed emphysema, bronchiectasis, 6 cm spiculated mass RLL at surface of the lung with a small loculated pleural effusion concerning for abscess vs malignancy, non-occlusive infrahilar pulmonary embolism. CT abdomen with 4 lesions hemangioma vs mets, non-distended sigmoid colon. Patient referred to [facility] for admission to treat possible pulmonary infection and to workup lung lesion, liver lesions, sigmoid colon abnormality.  Assessment/Plan:  Active Problems:   Inguinal hernia, right   Lesion of right lung   Pulmonary abscess (HCC)   Emphysema of lung (HCC)   Bronchiectasis (HCC)   Pneumonia due to organism   Sigmoid thickening   Pulmonary emboli (HCC)   RLL pneumonia  Right infrahilar  spiculated lung mass , loculated effusion.  Possibility of underlying mass with abscess/postobstructive pneumonia.  CT scan also showing centrilobular emphysema with bilateral bronchiectasis.  Continue on broad-spectrum antibiotic including Zosyn azithromycin.  Spoke with pulmonary for consultation.  Patient is a chronic smoker with pulmonary symptoms.  We will add IV vancomycin due to possibility of abscess.  Sputum for culture and sensitivity   Chronic cigarette smoker.  Smokes half pack a day.  We will put the patient on nicotine patch.  8 mm sclerotic lesion in the right S1.  Uncertain at this time.  Nonocclusive peripheral pulmonary emboli on the right side.  On heparin drip at this time.  Check lower extremity duplex ultrasound.  Preliminary report shows no evidence of DVT.  Liver lesions - mets vs hemangioma as per CT scan.  MRI did not show any discrete masses.  No evidence of of cirrhosis.  Mild proximal descending colitis/nondistended colon.  Change in stool caliber.  Possibility that the patient might need a GI evaluation at some point.  Alcohol use disorder.  Will closely monitor.  He states that he does not get any withdrawal symptoms.  He drinks beer every day.  Patient on Ativan as needed.  Continue thiamine folic acid   VTE Prophylaxis: Heparin drip  Code Status: Full code  Family Communication: I tried to to call the patient's sister Ms. Emma on the number listed but was unable to reach her.  Disposition Plan:  . Patient is from home . Likely disposition to home likely in 2 to  3 days . Barriers to discharge: Pulmonary consultation, IV antibiotics, further work-up   Consultants:  Pulmonary  Procedures:  None so far  Antibiotics:  . Azithromycin, Zosyn  Anti-infectives (From admission, onward)   Start     Dose/Rate Route Frequency Ordered Stop   07/21/19 2000  azithromycin (ZITHROMAX) tablet 250 mg     250 mg Oral Daily 07/20/19 2234 07/25/19 1959    07/21/19 0600  ceFEPIme (MAXIPIME) 2 g in sodium chloride 0.9 % 100 mL IVPB  Status:  Discontinued     2 g 200 mL/hr over 30 Minutes Intravenous Every 8 hours 07/21/19 0012 07/21/19 0037   07/21/19 0200  piperacillin-tazobactam (ZOSYN) IVPB 3.375 g     3.375 g 12.5 mL/hr over 240 Minutes Intravenous Every 8 hours 07/21/19 0037     07/20/19 2230  ceFEPIme (MAXIPIME) 1 g in sodium chloride 0.9 % 100 mL IVPB  Status:  Discontinued     1 g 200 mL/hr over 30 Minutes Intravenous Every 8 hours 07/20/19 2234 07/21/19 0012   07/20/19 2000  ceFEPIme (MAXIPIME) 1 g in sodium chloride 0.9 % 100 mL IVPB  Status:  Discontinued     1 g 200 mL/hr over 30 Minutes Intravenous STAT 07/20/19 1952 07/20/19 1956   07/20/19 2000  ceFEPIme (MAXIPIME) 2 g in sodium chloride 0.9 % 100 mL IVPB     2 g 200 mL/hr over 30 Minutes Intravenous STAT 07/20/19 1956 07/20/19 2109   07/20/19 1815  cefTRIAXone (ROCEPHIN) 1 g in sodium chloride 0.9 % 100 mL IVPB     1 g 200 mL/hr over 30 Minutes Intravenous  Once 07/20/19 1807 07/20/19 2000   07/20/19 1815  azithromycin (ZITHROMAX) tablet 500 mg     500 mg Oral  Once 07/20/19 1807 07/20/19 2000     Subjective: Today, patient was seen and examined at bedside.  Patient complains of mild cough and chest pain on coughing.  He also complains of mild back pain flank pain.  He does have yellowish sputum production.  No hemoptysis today.  Objective: Vitals:   07/21/19 0125 07/21/19 0442  BP: 140/80 131/79  Pulse: 84 89  Resp: (!) 24 (!) 24  Temp: 97.9 F (36.6 C) 98.2 F (36.8 C)  SpO2: 100% 97%    Intake/Output Summary (Last 24 hours) at 07/21/2019 1136 Last data filed at 07/21/2019 0600 Gross per 24 hour  Intake 2966.57 ml  Output 425 ml  Net 2541.57 ml   Filed Weights   07/21/19 0120  Weight: 58.2 kg   Body mass index is 16.93 kg/m.   Physical Exam: GENERAL: Patient is alert awake and oriented. Not in obvious distress.  Thinly built HENT: No scleral pallor or  icterus. Pupils equally reactive to light. Oral mucosa is moist NECK: is supple, no gross swelling noted. CHEST: Coarse breath sounds noted bilaterally.   CVS: S1 and S2 heard, no murmur. Regular rate and rhythm.  ABDOMEN: Soft, non-tender, bowel sounds are present. EXTREMITIES: No edema. CNS: Cranial nerves are intact. No focal motor deficits. SKIN: warm and dry without rashes.  Data Review: I have personally reviewed the following laboratory data and studies,  CBC: Recent Labs  Lab 07/20/19 1623 07/21/19 0553  WBC 15.8* 14.2*  NEUTROABS 12.2*  --   HGB 15.7 13.2  HCT 47.4 39.7  MCV 86.0 86.5  PLT 506* 161*   Basic Metabolic Panel: Recent Labs  Lab 07/20/19 1623 07/21/19 0553  NA 135 136  K 3.7 4.1  CL 96* 104  CO2 27 24  GLUCOSE 109* 116*  BUN 9 11  CREATININE 0.84 0.78  CALCIUM 9.0 8.4*   Liver Function Tests: Recent Labs  Lab 07/20/19 1623  AST 38  ALT 67*  ALKPHOS 106  BILITOT 1.0  PROT 8.5*  ALBUMIN 3.2*   Recent Labs  Lab 07/20/19 1623  LIPASE 15   No results for input(s): AMMONIA in the last 168 hours. Cardiac Enzymes: No results for input(s): CKTOTAL, CKMB, CKMBINDEX, TROPONINI in the last 168 hours. BNP (last 3 results) No results for input(s): BNP in the last 8760 hours.  ProBNP (last 3 results) No results for input(s): PROBNP in the last 8760 hours.  CBG: No results for input(s): GLUCAP in the last 168 hours. Recent Results (from the past 240 hour(s))  SARS CORONAVIRUS 2 (TAT 6-24 HRS) Nasopharyngeal Nasopharyngeal Swab     Status: None   Collection Time: 07/20/19  4:23 PM   Specimen: Nasopharyngeal Swab  Result Value Ref Range Status   SARS Coronavirus 2 NEGATIVE NEGATIVE Final    Comment: (NOTE) SARS-CoV-2 target nucleic acids are NOT DETECTED. The SARS-CoV-2 RNA is generally detectable in upper and lower respiratory specimens during the acute phase of infection. Negative results do not preclude SARS-CoV-2 infection, do not rule  out co-infections with other pathogens, and should not be used as the sole basis for treatment or other patient management decisions. Negative results must be combined with clinical observations, patient history, and epidemiological information. The expected result is Negative. Fact Sheet for Patients: HairSlick.no Fact Sheet for Healthcare Providers: quierodirigir.com This test is not yet approved or cleared by the [place] FDA and  has been authorized for detection and/or diagnosis of SARS-CoV-2 by FDA under an Emergency Use Authorization (EUA). This EUA will remain  in effect (meaning this test can be used) for the duration of the COVID-19 declaration under Section 56 4(b)(1) of the Act, 21 U.S.C. section 360bbb-3(b)(1), unless the authorization is terminated or revoked sooner. Performed at [facility], [address], [place], [place] [address]   Culture, blood (single)     Status: None (Preliminary result)   Collection Time: 07/20/19  4:23 PM   Specimen: BLOOD LEFT FOREARM  Result Value Ref Range Status   Specimen Description   Final    BLOOD LEFT FOREARM Performed at [facility], [address], [place], [place] [address]    Special Requests   Final    BOTTLES DRAWN AEROBIC ONLY Blood Culture results may not be optimal due to an inadequate volume of blood received in culture bottles Performed at [facility], [address] [place], [place] [address]    Culture   Final    NO GROWTH < 12 HOURS Performed at [facility], [address], [place], [place] [address]    Report Status PENDING  Incomplete     Studies: CT Chest W Contrast  Addendum Date: 07/20/2019   ADDENDUM REPORT: 07/20/2019 19:07 ADDENDUM: I discussed critical value/emergent results and the need for additional follow-up imaging by telephone at the time of interpretation on 07/20/2019 at 7:03 pm with  the patient's provider Dr. Landis Gandy, who verbally acknowledged these results. Electronically Signed   By: Laurence Ferrari   On: 07/20/2019 19:07   Result Date: 07/20/2019 CLINICAL DATA:  Left lower back pain radiating down the left leg for the past 2 weeks. Possible cavitary pulmonary lesion. EXAM: CT CHEST, ABDOMEN, AND PELVIS WITH CONTRAST  TECHNIQUE: Multidetector CT imaging of the chest, abdomen and pelvis was performed following the standard protocol during bolus administration of intravenous contrast. Automatic exposure control utilized. CONTRAST:  OMNIPAQUE IOHEXOL 300 MG/ML  SOLN COMPARISON:  None. Chest radiography acquired earlier on the same date. FINDINGS: CT CHEST FINDINGS Cardiovascular: Normal heart size. Four-vessel coronary calcification moderate along the LAD ostia. No thoracic aortic aneurysm. Patent central and lobar pulmonary arteries. Punctate central contrast filling defects in the right infrahilar segmental branches. Mediastinum/Nodes: Borderline enlarged right hilar noncalcified lymph node, 11 x 18 mm. Nonspecific 11 mm short axis diameter subcarinal and subcentimeter mediastinal lymph nodes. Lungs/Pleura: Moderate centrilobular emphysema with bilateral bronchiectasis most pronounced in the lung bases. Bulky aspirated material in the right mainstem bronchus. Right infrahilar spiculated 6 cm consolidation with multiple internal loculated fluid components and traversing air bronchograms and peripheral interlobular septal thickening corresponding to the prior radiographic abnormality, possibly infectious and/or malignant or ischemic. No associated cavitary lesion. Partially loculated small right pleural effusion. No left pleural effusion. Musculoskeletal: Moderate skeletal degenerative change. No cortical erosion or suspicious osseous lesion of the thoracic spine or ribcage or sternum., Partially loculated along the right lateral and posterior chest wall. CT ABDOMEN PELVIS FINDINGS  Hepatobiliary: An 11 mm hypervascular lesion in the right hepatic dome on axial series 2, image 52 and coronal series 7, image 72. A second hypervascular lesion in the posterior right hemi liver measuring 9 x 7 mm on coronal image 97 and axial image 67. A third hypervascular lesion measuring 15 x 11 mm at the junction of the right and left hemi liver on axial image 60 and coronal image 45. A fourth hypervascular lesion in the caudate lobe measuring 11 x 8 mm on axial image 68 and coronal image 57. Borderline hepatomegaly with normal smooth hepatic contours. Pancreas: No acute pancreatitis or main ductal dilatation. Spleen: Normal. Adrenals/Urinary Tract: Normal adrenal glands. No apparent abnormality of the urinary bladder or bilateral ureters. Subcentimeter left renal cortical cysts, and otherwise normal bilateral kidneys. Stomach/Bowel: Mild nonspecific colitis of the proximal descending colon along the left flank without pericolonic abscess or bowel perforation. Several nondistended versus non distensible short segments in the sigmoid colon. No bowel obstruction. Probably decompressed appendix, sagittal image 73. Decompressed stomach and small bowel without an apparent mucosal abnormality or obstruction. Right indirect inguinal herniation of elongated mesentery, mesenteric vasculature, and small bowel loops through a wide 4.5 cm defect with extension into the scrotal sac, but without apparent strangulation or incarceration. Vascular/Lymphatic: Areas of noncontrast opacification in the left renal vein and inferior vena cava extending into the right cardiac atria which is probably secondary to contrast bolus timing given the unenhanced hepatic and more distal ileocaval veins. Infundibulum at the superior mesenteric artery ostium. Abdominal aorta minimal calcified atherosclerosis without aneurysmal dilatation. Nonspecific small retroperitoneal lymph nodes. Reproductive: Prostatomegaly. Other: Mild dermal thickening  overlying the sacrum without discrete ulceration or osseous erosion or abscess. Musculoskeletal: Moderate skeletal degenerative change and diffuse bone demineralization. A couple punctate sclerotic lesion in the hips and bony pelvis. An 8 mm sclerotic lesion in the right S1 segment similar to the July 08, 2019 spine radiography, but new since the May 28, 2004 spine radiography, concerning for an osteoblastic lesion. IMPRESSION: A mild uncomplicated colitis of the proximal descending colon. Short segments of nondistended versus non-distensible sigmoid colon. Correlation with colonoscopy recommended to exclude an underlying colonic malignancy. Right infrahilar spiculated lung mass with multiple internal cystic lesions, but no associated cavitation at this time.  An infectious pulmonary abscess, malignant, or less likely ischemic etiologies are differential considerations. Small right partially loculated pleural effusion, possibly benign or malignant. Nonspecific mildly enlarged right hilar lymph node. Unenhanced CT chest recommended in 6-8 weeks to document any interval response to therapy. Tissue sampling could be considered. Nonocclusive peripheral pulmonary emboli in the right infrahilar region. Patent central pulmonary arteries without right heart strain. Several small hypervascular hepatic lesions, incompletely characterized on single-phase enhanced imaging measuring up to 15 mm diameter, possibly benign hemangiomas but a malignant etiology is not excluded. Borderline hepatomegaly without cirrhosis. Further characterization with MR abdomen without and with intravenous contrast liver protocol recommended. 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 Four-vessel coronary calcification. Large right indirect inguinal herniation of mesentery and small bowel loops through a wide  neck without strangulation or incarceration. Prostatomegaly. Centrilobular emphysema with bilateral bronchiectasis, aspiration, and aortic calcified atherosclerosis. Electronically Signed: By: Laurence Ferrariachel  [place] On: 07/20/2019 19:00   CT ABDOMEN PELVIS W CONTRAST  Addendum Date: 07/20/2019   ADDENDUM REPORT: 07/20/2019 19:07 ADDENDUM: I discussed critical value/emergent results and the need for additional follow-up imaging by telephone at the time of interpretation on 07/20/2019 at 7:03 pm with the patient's provider Dr. Landis GandyWilder, who verbally acknowledged these results. Electronically Signed   By: Laurence Ferrariachel  [place]   On: 07/20/2019 19:07   Result Date: 07/20/2019 CLINICAL DATA:  Left lower back pain radiating down the left leg for the past 2 weeks. Possible cavitary pulmonary lesion. EXAM: CT CHEST, ABDOMEN, AND PELVIS WITH CONTRAST TECHNIQUE: Multidetector CT imaging of the chest, abdomen and pelvis was performed following the standard protocol during bolus administration of intravenous contrast. Automatic exposure control utilized. CONTRAST:  100mL OMNIPAQUE IOHEXOL 300 MG/ML  SOLN COMPARISON:  None. Chest radiography acquired earlier on the same date. FINDINGS: CT CHEST FINDINGS Cardiovascular: Normal heart size. Four-vessel coronary calcification moderate along the LAD ostia. No thoracic aortic aneurysm. Patent central and lobar pulmonary arteries. Punctate central contrast filling defects in the right infrahilar segmental branches. Mediastinum/Nodes: Borderline enlarged right hilar noncalcified lymph node, 11 x 18 mm. Nonspecific 11 mm short axis diameter subcarinal and subcentimeter mediastinal lymph nodes. Lungs/Pleura: Moderate centrilobular emphysema with bilateral bronchiectasis most pronounced in the lung bases. Bulky aspirated material in the right mainstem bronchus. Right infrahilar spiculated 6 cm consolidation with multiple internal loculated fluid components and traversing air bronchograms and peripheral  interlobular septal thickening corresponding to the prior radiographic abnormality, possibly infectious and/or malignant or ischemic. No associated cavitary lesion. Partially loculated small right pleural effusion. No left pleural effusion. Musculoskeletal: Moderate skeletal degenerative change. No cortical erosion or suspicious osseous lesion of the thoracic spine or ribcage or sternum., Partially loculated along the right lateral and posterior chest wall. CT ABDOMEN PELVIS FINDINGS Hepatobiliary: An 11 mm hypervascular lesion in the right hepatic dome on axial series 2, image 52 and coronal series 7, image 72. A second hypervascular lesion in the posterior right hemi liver measuring 9 x 7 mm on coronal image 97 and axial image 67. A third hypervascular lesion measuring 15 x 11 mm at the junction of the right and left hemi liver on axial image 60 and coronal image 45. A fourth hypervascular lesion in the caudate lobe measuring 11 x 8 mm on axial image 68 and coronal image 57. Borderline hepatomegaly with normal smooth hepatic contours. Pancreas: No acute pancreatitis or main ductal  dilatation. Spleen: Normal. Adrenals/Urinary Tract: Normal adrenal glands. No apparent abnormality of the urinary bladder or bilateral ureters. Subcentimeter left renal cortical cysts, and otherwise normal bilateral kidneys. Stomach/Bowel: Mild nonspecific colitis of the proximal descending colon along the left flank without pericolonic abscess or bowel perforation. Several nondistended versus non distensible short segments in the sigmoid colon. No bowel obstruction. Probably decompressed appendix, sagittal image 73. Decompressed stomach and small bowel without an apparent mucosal abnormality or obstruction. Right indirect inguinal herniation of elongated mesentery, mesenteric vasculature, and small bowel loops through a wide 4.5 cm defect with extension into the scrotal sac, but without apparent strangulation or incarceration.  Vascular/Lymphatic: Areas of noncontrast opacification in the left renal vein and inferior vena cava extending into the right cardiac atria which is probably secondary to contrast bolus timing given the unenhanced hepatic and more distal ileocaval veins. Infundibulum at the superior mesenteric artery ostium. Abdominal aorta minimal calcified atherosclerosis without aneurysmal dilatation. Nonspecific small retroperitoneal lymph nodes. Reproductive: Prostatomegaly. Other: Mild dermal thickening overlying the sacrum without discrete ulceration or osseous erosion or abscess. Musculoskeletal: Moderate skeletal degenerative change and diffuse bone demineralization. A couple punctate sclerotic lesion in the hips and bony pelvis. An 8 mm sclerotic lesion in the right S1 segment similar to the July 08, 2019 spine radiography, but new since the May 28, 2004 spine radiography, concerning for an osteoblastic lesion. IMPRESSION: A mild uncomplicated colitis of the proximal descending colon. Short segments of nondistended versus non-distensible sigmoid colon. Correlation with colonoscopy recommended to exclude an underlying colonic malignancy. Right infrahilar spiculated lung mass with multiple internal cystic lesions, but no associated cavitation at this time. An infectious pulmonary abscess, malignant, or less likely ischemic etiologies are differential considerations. Small right partially loculated pleural effusion, possibly benign or malignant. Nonspecific mildly enlarged right hilar lymph node. Unenhanced CT chest recommended in 6-8 weeks to document any interval response to therapy. Tissue sampling could be considered. Nonocclusive peripheral pulmonary emboli in the right infrahilar region. Patent central pulmonary arteries without right heart strain. Several small hypervascular hepatic lesions, incompletely characterized on single-phase enhanced imaging measuring up to 15 mm diameter, possibly benign hemangiomas but a  malignant etiology is not excluded. Borderline hepatomegaly without cirrhosis. Further characterization with MR abdomen without and with intravenous contrast liver protocol recommended. 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 Four-vessel coronary calcification. Large right indirect inguinal herniation of mesentery and small bowel loops through a wide neck without strangulation or incarceration. Prostatomegaly. Centrilobular emphysema with bilateral bronchiectasis, aspiration, and aortic calcified atherosclerosis. Electronically Signed: By: Laurence Ferrari On: 07/20/2019 19:00   MR LIVER W WO CONTRAST  Result Date: 07/21/2019 CLINICAL DATA:  Indeterminate liver lesions on CT EXAM: MRI ABDOMEN WITHOUT AND WITH CONTRAST TECHNIQUE: Multiplanar multisequence MR imaging of the abdomen was performed both before and after the administration of intravenous contrast. CONTRAST:  5mL GADAVIST GADOBUTROL 1 MMOL/ML IV SOLN COMPARISON:  07/20/2019 abdominal CT FINDINGS: Portions of exam are mildly motion degraded. Lower chest: Small right pleural effusion. Right lower lobe process was detailed on CT. Hepatobiliary: No cirrhosis. No T2 hyperintense lesions or areas of restricted diffusion. Arterial phase images demonstrate multiple subcentimeter foci of hyperenhancement, including on series 19. Late arterial and early portal venous phase apparent hyperenhancement within the left hepatic lobe, including on 25/21, is favored to be due to perfusion anomaly (and possibly mild artifact). No  convincing evidence of underlying lesion in this area. Normal gallbladder, without biliary ductal dilatation. Pancreas:  Normal, without mass or ductal dilatation. Spleen:  Normal in size, without focal abnormality. Adrenals/Urinary Tract: Normal adrenal glands. Interpolar left renal cyst. Normal right  kidney. Stomach/Bowel: Normal stomach and small bowel loops. Large colonic stool burden. Vascular/Lymphatic: Aortic atherosclerosis. No abdominal adenopathy. Other:  No ascites. Musculoskeletal: Convex right lumbar spine curvature. IMPRESSION: 1. Motion degraded exam. 2. Areas of arterial phase hyperenhancement throughout the liver, without correlate on other pulse sequences. Favored to represent perfusion anomalies. Given motion limitations of the current exam, outpatient follow-up MRI at 3 months should be considered. 3. Small right pleural effusion and right lower lobe lung process, as on CT. 4.  Aortic Atherosclerosis (ICD10-I70.0). Electronically Signed   By: Jeronimo Greaves M.D.   On: 07/21/2019 10:08   DG Chest Port 1 View  Result Date: 07/20/2019 CLINICAL DATA:  Cough and weakness. EXAM: PORTABLE CHEST 1 VIEW COMPARISON:  August 02, 2015 FINDINGS: An 8.8 cm x 4.3 cm mildly lobulated opacity is seen overlying the lateral aspect of the mid to lower right lung. This area contains a 2.3 cm x 1.6 cm focal lucency. There is no evidence of a pleural effusion or pneumothorax. The heart size and mediastinal contours are within normal limits. The visualized skeletal structures are unremarkable. IMPRESSION: 8.8 cm x 4.3 cm mildly lobulated opacity overlying the lateral aspect of the mid to lower right lung. While this may represent a cavitary pneumonia, a cavitary lung mass cannot be excluded. Correlation with chest CT is recommended. Electronically Signed   By: Aram Candela M.D.   On: 07/20/2019 16:32   VAS [place] LOWER EXTREMITY VENOUS (DVT)  Result Date: 07/21/2019  Lower Venous DVTStudy Indications: Pulmonary embolism.  Comparison Study: no prior Performing Technologist: Blanch Media RVS  Examination Guidelines: A complete evaluation includes B-mode imaging, spectral Doppler, color Doppler, and power Doppler as needed of all accessible portions of each vessel. Bilateral testing is considered an integral part  of a complete examination. Limited examinations for reoccurring indications may be performed as noted. The reflux portion of the exam is performed with the patient in reverse Trendelenburg.  +---------+---------------+---------+-----------+----------+--------------+ RIGHT    CompressibilityPhasicitySpontaneityPropertiesThrombus Aging +---------+---------------+---------+-----------+----------+--------------+ CFV      Full           Yes      Yes                                 +---------+---------------+---------+-----------+----------+--------------+ SFJ      Full                                                        +---------+---------------+---------+-----------+----------+--------------+ FV Prox  Full                                                        +---------+---------------+---------+-----------+----------+--------------+ FV Mid   Full                                                        +---------+---------------+---------+-----------+----------+--------------+  FV DistalFull                                                        +---------+---------------+---------+-----------+----------+--------------+ PFV      Full                                                        +---------+---------------+---------+-----------+----------+--------------+ POP      Full           Yes      Yes                                 +---------+---------------+---------+-----------+----------+--------------+ PTV      Full                                                        +---------+---------------+---------+-----------+----------+--------------+ PERO     Full                                                        +---------+---------------+---------+-----------+----------+--------------+   +---------+---------------+---------+-----------+----------+--------------+ LEFT     CompressibilityPhasicitySpontaneityPropertiesThrombus Aging  +---------+---------------+---------+-----------+----------+--------------+ CFV      Full           Yes      Yes                                 +---------+---------------+---------+-----------+----------+--------------+ SFJ      Full                                                        +---------+---------------+---------+-----------+----------+--------------+ FV Prox  Full                                                        +---------+---------------+---------+-----------+----------+--------------+ FV Mid   Full                                                        +---------+---------------+---------+-----------+----------+--------------+ FV DistalFull                                                        +---------+---------------+---------+-----------+----------+--------------+   PFV      Full                                                        +---------+---------------+---------+-----------+----------+--------------+ POP      Full           Yes      Yes                                 +---------+---------------+---------+-----------+----------+--------------+ PTV      Full                                                        +---------+---------------+---------+-----------+----------+--------------+ PERO     Full                                                        +---------+---------------+---------+-----------+----------+--------------+     Summary: BILATERAL: - No evidence of deep vein thrombosis seen in the lower extremities, bilaterally.   *See table(s) above for measurements and observations.    Preliminary       Joycelyn Das, MD  [facility] 07/21/2019

## 2019-07-21 NOTE — Consult Note (Signed)
Name: Marcus Pace MRN: [id number] DOB: [birth year]    ADMISSION DATE:  07/20/2019 CONSULTATION DATE: 07/21/2019  REFERRING MD : [facility]  CHIEF COMPLAINT: Back pain in the setting of suspected lung and liver cancer with complication of pulmonary embolism.  BRIEF PATIENT DESCRIPTION: Thin 64 year old male with chief complaint of back pain  SIGNIFICANT EVENTS    STUDIES:  CT of the abdomen and chest is noted   HISTORY OF PRESENT ILLNESS:   64 year old male 1 pack/day smoker since age 54 who is currently unemployed and safe food Buyer, retail.  He notes last week that he has severe back and right flank pain that was incapacitating.  He also notes on 07/17/2019 he had fever, chills and a productive cough with some mild hemoptysis.  The pain was so bad he decided to seek medical care.  He does have an unknown reduce inguinal hernia as it is been his main concern over the last years. He was evaluated in the [facility] long [facility] emergency department and CT scan revealed moderate central lobar emphysema with bilateral bronchiectasis.  He had bulky aspirated material in the right mainstem bronchus.  A right infrascapular spiculated 6 cm consolidation multiple internal loculated fluid components along with air bronchograms.  These were concerning for possible malignancy. CT the abdomen revealed uncomplicated colitis of the proximal descending colon.  He was noted to have multiple liver lesions along with borderline hepatomegaly. Pulmonary critical care was asked to evaluate on 07/21/2019 for possible interventions.  Liver lesions will be the ideal for tissue biopsy and for staging of cancer.  Any attempted biopsy of lung material could lead to fistula and further complications. He notes that he drinks several beers a day.  He does smoke a pack of cigarettes a day.  He denies illicit drug use.  PAST MEDICAL HISTORY :   has a past medical history of Bacteremia and Inguinal hernia recurrent  unilateral.  has no past surgical history on file. Prior to Admission medications   Medication Sig Start Date End Date Taking? Authorizing Provider  ibuprofen (ADVIL) 400 MG tablet Take 400 mg by mouth every 6 (six) hours as needed for moderate pain.   Yes [provider]  feeding supplement, ENSURE ENLIVE, (ENSURE ENLIVE) LIQD Take 237 mLs by mouth daily. Patient not taking: Reported on 12/22/2017 08/04/15   Maretta Bees, MD  meloxicam (MOBIC) 7.5 MG tablet Take 1 tablet (7.5 mg total) by mouth daily. 07/08/19   Wallis Bamberg, PA-C  tiZANidine (ZANAFLEX) 4 MG tablet Take 1 tablet (4 mg total) by mouth at bedtime as needed for muscle spasms. 07/08/19   Wallis Bamberg, PA-C   No Known Allergies  FAMILY HISTORY:  family history is not on file. SOCIAL HISTORY:  reports that he has been smoking. He has been smoking about 1.00 pack per day. He has never used smokeless tobacco. He reports current alcohol use. He reports that he does not use drugs.  REVIEW OF SYSTEMS:   10 point review of system taken, please see HPI for positives and negatives.   SUBJECTIVE:  64 year old male whose greatest complaint is right flank and back pain.  No respiratory distress VITAL SIGNS: Temp:  [97.9 F (36.6 C)-99.5 F (37.5 C)] 98.2 F (36.8 C) (03/17 0442) Pulse Rate:  [83-116] 89 (03/17 0442) Resp:  [18-43] 24 (03/17 0442) BP: (114-140)/(75-91) 131/79 (03/17 0442) SpO2:  [93 %-100 %] 97 % (03/17 0442) Weight:  [58.2 kg] 58.2 kg (03/17 0120)  PHYSICAL EXAMINATION: General:  Thin male who is no acute distress at rest Neuro: Grossly intact no focal defects appreciated HEENT: No JVD or lymphadenopathy is appreciated Cardiovascular: Heart sounds are regular regular rhythm Lungs: Essentially unremarkable Abdomen: Slight tenderness with soft with no acute deformities.  Scrotal sac without obvious enlargement Musculoskeletal: Intact Skin: Warm and dry  Recent Labs  Lab 07/20/19 1623 07/21/19 0553    NA 135 136  K 3.7 4.1  CL 96* 104  CO2 27 24  BUN 9 11  CREATININE 0.84 0.78  GLUCOSE 109* 116*   Recent Labs  Lab 07/20/19 1623 07/21/19 0553  HGB 15.7 13.2  HCT 47.4 39.7  WBC 15.8* 14.2*  PLT 506* 427*   CT Chest W Contrast  Addendum Date: 07/20/2019   ADDENDUM REPORT: 07/20/2019 19:07 ADDENDUM: I discussed critical value/emergent results and the need for additional follow-up imaging by telephone at the time of interpretation on 07/20/2019 at 7:03 pm with the patient's provider Dr. Landis Gandy, who verbally acknowledged these results. Electronically Signed   By: Laurence Ferrari   On: 07/20/2019 19:07   Result Date: 07/20/2019 CLINICAL DATA:  Left lower back pain radiating down the left leg for the past 2 weeks. Possible cavitary pulmonary lesion. EXAM: CT CHEST, ABDOMEN, AND PELVIS WITH CONTRAST TECHNIQUE: Multidetector CT imaging of the chest, abdomen and pelvis was performed following the standard protocol during bolus administration of intravenous contrast. Automatic exposure control utilized. CONTRAST:  OMNIPAQUE IOHEXOL 300 MG/ML  SOLN COMPARISON:  None. Chest radiography acquired earlier on the same date. FINDINGS: CT CHEST FINDINGS Cardiovascular: Normal heart size. Four-vessel coronary calcification moderate along the LAD ostia. No thoracic aortic aneurysm. Patent central and lobar pulmonary arteries. Punctate central contrast filling defects in the right infrahilar segmental branches. Mediastinum/Nodes: Borderline enlarged right hilar noncalcified lymph node, 11 x 18 mm. Nonspecific 11 mm short axis diameter subcarinal and subcentimeter mediastinal lymph nodes. Lungs/Pleura: Moderate centrilobular emphysema with bilateral bronchiectasis most pronounced in the lung bases. Bulky aspirated material in the right mainstem bronchus. Right infrahilar spiculated 6 cm consolidation with multiple internal loculated fluid components and traversing air bronchograms and peripheral interlobular  septal thickening corresponding to the prior radiographic abnormality, possibly infectious and/or malignant or ischemic. No associated cavitary lesion. Partially loculated small right pleural effusion. No left pleural effusion. Musculoskeletal: Moderate skeletal degenerative change. No cortical erosion or suspicious osseous lesion of the thoracic spine or ribcage or sternum., Partially loculated along the right lateral and posterior chest wall. CT ABDOMEN PELVIS FINDINGS Hepatobiliary: An 11 mm hypervascular lesion in the right hepatic dome on axial series 2, image 52 and coronal series 7, image 72. A second hypervascular lesion in the posterior right hemi liver measuring 9 x 7 mm on coronal image 97 and axial image 67. A third hypervascular lesion measuring 15 x 11 mm at the junction of the right and left hemi liver on axial image 60 and coronal image 45. A fourth hypervascular lesion in the caudate lobe measuring 11 x 8 mm on axial image 68 and coronal image 57. Borderline hepatomegaly with normal smooth hepatic contours. Pancreas: No acute pancreatitis or main ductal dilatation. Spleen: Normal. Adrenals/Urinary Tract: Normal adrenal glands. No apparent abnormality of the urinary bladder or bilateral ureters. Subcentimeter left renal cortical cysts, and otherwise normal bilateral kidneys. Stomach/Bowel: Mild nonspecific colitis of the proximal descending colon along the left flank without pericolonic abscess or bowel perforation. Several nondistended versus non distensible short segments in the sigmoid colon. No bowel obstruction. Probably decompressed appendix,  sagittal image 73. Decompressed stomach and small bowel without an apparent mucosal abnormality or obstruction. Right indirect inguinal herniation of elongated mesentery, mesenteric vasculature, and small bowel loops through a wide 4.5 cm defect with extension into the scrotal sac, but without apparent strangulation or incarceration. Vascular/Lymphatic:  Areas of noncontrast opacification in the left renal vein and inferior vena cava extending into the right cardiac atria which is probably secondary to contrast bolus timing given the unenhanced hepatic and more distal ileocaval veins. Infundibulum at the superior mesenteric artery ostium. Abdominal aorta minimal calcified atherosclerosis without aneurysmal dilatation. Nonspecific small retroperitoneal lymph nodes. Reproductive: Prostatomegaly. Other: Mild dermal thickening overlying the sacrum without discrete ulceration or osseous erosion or abscess. Musculoskeletal: Moderate skeletal degenerative change and diffuse bone demineralization. A couple punctate sclerotic lesion in the hips and bony pelvis. An 8 mm sclerotic lesion in the right S1 segment similar to the July 08, 2019 spine radiography, but new since the May 28, 2004 spine radiography, concerning for an osteoblastic lesion. IMPRESSION: A mild uncomplicated colitis of the proximal descending colon. Short segments of nondistended versus non-distensible sigmoid colon. Correlation with colonoscopy recommended to exclude an underlying colonic malignancy. Right infrahilar spiculated lung mass with multiple internal cystic lesions, but no associated cavitation at this time. An infectious pulmonary abscess, malignant, or less likely ischemic etiologies are differential considerations. Small right partially loculated pleural effusion, possibly benign or malignant. Nonspecific mildly enlarged right hilar lymph node. Unenhanced CT chest recommended in 6-8 weeks to document any interval response to therapy. Tissue sampling could be considered. Nonocclusive peripheral pulmonary emboli in the right infrahilar region. Patent central pulmonary arteries without right heart strain. Several small hypervascular hepatic lesions, incompletely characterized on single-phase enhanced imaging measuring up to 15 mm diameter, possibly benign hemangiomas but a malignant etiology  is not excluded. Borderline hepatomegaly without cirrhosis. Further characterization with MR abdomen without and with intravenous contrast liver protocol recommended. 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 Four-vessel coronary calcification. Large right indirect inguinal herniation of mesentery and small bowel loops through a wide neck without strangulation or incarceration. Prostatomegaly. Centrilobular emphysema with bilateral bronchiectasis, aspiration, and aortic calcified atherosclerosis. Electronically Signed: By: Laurence Ferrariachel  [place] On: 07/20/2019 19:00   CT ABDOMEN PELVIS W CONTRAST  Addendum Date: 07/20/2019   ADDENDUM REPORT: 07/20/2019 19:07 ADDENDUM: I discussed critical value/emergent results and the need for additional follow-up imaging by telephone at the time of interpretation on 07/20/2019 at 7:03 pm with the patient's provider Dr. Landis GandyWilder, who verbally acknowledged these results. Electronically Signed   By: Laurence Ferrariachel  [place]   On: 07/20/2019 19:07   Result Date: 07/20/2019 CLINICAL DATA:  Left lower back pain radiating down the left leg for the past 2 weeks. Possible cavitary pulmonary lesion. EXAM: CT CHEST, ABDOMEN, AND PELVIS WITH CONTRAST TECHNIQUE: Multidetector CT imaging of the chest, abdomen and pelvis was performed following the standard protocol during bolus administration of intravenous contrast. Automatic exposure control utilized. CONTRAST:  100mL OMNIPAQUE IOHEXOL 300 MG/ML  SOLN COMPARISON:  None. Chest radiography acquired earlier on the same date. FINDINGS: CT CHEST FINDINGS Cardiovascular: Normal heart size. Four-vessel coronary calcification moderate along the LAD ostia. No thoracic aortic aneurysm. Patent central and lobar pulmonary arteries. Punctate central contrast filling defects in the right infrahilar segmental branches.  Mediastinum/Nodes: Borderline enlarged right hilar noncalcified lymph node, 11 x 18 mm. Nonspecific 11 mm short  axis diameter subcarinal and subcentimeter mediastinal lymph nodes. Lungs/Pleura: Moderate centrilobular emphysema with bilateral bronchiectasis most pronounced in the lung bases. Bulky aspirated material in the right mainstem bronchus. Right infrahilar spiculated 6 cm consolidation with multiple internal loculated fluid components and traversing air bronchograms and peripheral interlobular septal thickening corresponding to the prior radiographic abnormality, possibly infectious and/or malignant or ischemic. No associated cavitary lesion. Partially loculated small right pleural effusion. No left pleural effusion. Musculoskeletal: Moderate skeletal degenerative change. No cortical erosion or suspicious osseous lesion of the thoracic spine or ribcage or sternum., Partially loculated along the right lateral and posterior chest wall. CT ABDOMEN PELVIS FINDINGS Hepatobiliary: An 11 mm hypervascular lesion in the right hepatic dome on axial series 2, image 52 and coronal series 7, image 72. A second hypervascular lesion in the posterior right hemi liver measuring 9 x 7 mm on coronal image 97 and axial image 67. A third hypervascular lesion measuring 15 x 11 mm at the junction of the right and left hemi liver on axial image 60 and coronal image 45. A fourth hypervascular lesion in the caudate lobe measuring 11 x 8 mm on axial image 68 and coronal image 57. Borderline hepatomegaly with normal smooth hepatic contours. Pancreas: No acute pancreatitis or main ductal dilatation. Spleen: Normal. Adrenals/Urinary Tract: Normal adrenal glands. No apparent abnormality of the urinary bladder or bilateral ureters. Subcentimeter left renal cortical cysts, and otherwise normal bilateral kidneys. Stomach/Bowel: Mild nonspecific colitis of the proximal descending colon along the left flank without pericolonic abscess or bowel  perforation. Several nondistended versus non distensible short segments in the sigmoid colon. No bowel obstruction. Probably decompressed appendix, sagittal image 73. Decompressed stomach and small bowel without an apparent mucosal abnormality or obstruction. Right indirect inguinal herniation of elongated mesentery, mesenteric vasculature, and small bowel loops through a wide 4.5 cm defect with extension into the scrotal sac, but without apparent strangulation or incarceration. Vascular/Lymphatic: Areas of noncontrast opacification in the left renal vein and inferior vena cava extending into the right cardiac atria which is probably secondary to contrast bolus timing given the unenhanced hepatic and more distal ileocaval veins. Infundibulum at the superior mesenteric artery ostium. Abdominal aorta minimal calcified atherosclerosis without aneurysmal dilatation. Nonspecific small retroperitoneal lymph nodes. Reproductive: Prostatomegaly. Other: Mild dermal thickening overlying the sacrum without discrete ulceration or osseous erosion or abscess. Musculoskeletal: Moderate skeletal degenerative change and diffuse bone demineralization. A couple punctate sclerotic lesion in the hips and bony pelvis. An 8 mm sclerotic lesion in the right S1 segment similar to the July 08, 2019 spine radiography, but new since the May 28, 2004 spine radiography, concerning for an osteoblastic lesion. IMPRESSION: A mild uncomplicated colitis of the proximal descending colon. Short segments of nondistended versus non-distensible sigmoid colon. Correlation with colonoscopy recommended to exclude an underlying colonic malignancy. Right infrahilar spiculated lung mass with multiple internal cystic lesions, but no associated cavitation at this time. An infectious pulmonary abscess, malignant, or less likely ischemic etiologies are differential considerations. Small right partially loculated pleural effusion, possibly benign or malignant.  Nonspecific mildly enlarged right hilar lymph node. Unenhanced CT chest recommended in 6-8 weeks to document any interval response to therapy. Tissue sampling could be considered. Nonocclusive peripheral pulmonary emboli in the right infrahilar region. Patent central pulmonary arteries without right heart strain. Several small hypervascular hepatic lesions, incompletely characterized on single-phase enhanced imaging measuring up to 15 mm diameter, possibly benign hemangiomas but a malignant etiology is not excluded. Borderline hepatomegaly without cirrhosis. Further characterization  with MR abdomen without and with intravenous contrast liver protocol recommended. 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 Four-vessel coronary calcification. Large right indirect inguinal herniation of mesentery and small bowel loops through a wide neck without strangulation or incarceration. Prostatomegaly. Centrilobular emphysema with bilateral bronchiectasis, aspiration, and aortic calcified atherosclerosis. Electronically Signed: By: Laurence Ferrari On: 07/20/2019 19:00   MR LIVER W WO CONTRAST  Result Date: 07/21/2019 CLINICAL DATA:  Indeterminate liver lesions on CT EXAM: MRI ABDOMEN WITHOUT AND WITH CONTRAST TECHNIQUE: Multiplanar multisequence MR imaging of the abdomen was performed both before and after the administration of intravenous contrast. CONTRAST:  4mL GADAVIST GADOBUTROL 1 MMOL/ML IV SOLN COMPARISON:  07/20/2019 abdominal CT FINDINGS: Portions of exam are mildly motion degraded. Lower chest: Small right pleural effusion. Right lower lobe process was detailed on CT. Hepatobiliary: No cirrhosis. No T2 hyperintense lesions or areas of restricted diffusion. Arterial phase images demonstrate multiple subcentimeter foci of hyperenhancement, including on series 19. Late arterial and  early portal venous phase apparent hyperenhancement within the left hepatic lobe, including on 25/21, is favored to be due to perfusion anomaly (and possibly mild artifact). No convincing evidence of underlying lesion in this area. Normal gallbladder, without biliary ductal dilatation. Pancreas:  Normal, without mass or ductal dilatation. Spleen:  Normal in size, without focal abnormality. Adrenals/Urinary Tract: Normal adrenal glands. Interpolar left renal cyst. Normal right kidney. Stomach/Bowel: Normal stomach and small bowel loops. Large colonic stool burden. Vascular/Lymphatic: Aortic atherosclerosis. No abdominal adenopathy. Other:  No ascites. Musculoskeletal: Convex right lumbar spine curvature. IMPRESSION: 1. Motion degraded exam. 2. Areas of arterial phase hyperenhancement throughout the liver, without correlate on other pulse sequences. Favored to represent perfusion anomalies. Given motion limitations of the current exam, outpatient follow-up MRI at 3 months should be considered. 3. Small right pleural effusion and right lower lobe lung process, as on CT. 4.  Aortic Atherosclerosis (ICD10-I70.0). Electronically Signed   By: Jeronimo Greaves M.D.   On: 07/21/2019 10:08   DG Chest Port 1 View  Result Date: 07/20/2019 CLINICAL DATA:  Cough and weakness. EXAM: PORTABLE CHEST 1 VIEW COMPARISON:  August 02, 2015 FINDINGS: An 8.8 cm x 4.3 cm mildly lobulated opacity is seen overlying the lateral aspect of the mid to lower right lung. This area contains a 2.3 cm x 1.6 cm focal lucency. There is no evidence of a pleural effusion or pneumothorax. The heart size and mediastinal contours are within normal limits. The visualized skeletal structures are unremarkable. IMPRESSION: 8.8 cm x 4.3 cm mildly lobulated opacity overlying the lateral aspect of the mid to lower right lung. While this may represent a cavitary pneumonia, a cavitary lung mass cannot be excluded. Correlation with chest CT is recommended.  Electronically Signed   By: Aram Candela M.D.   On: 07/20/2019 16:32   VAS [place] LOWER EXTREMITY VENOUS (DVT)  Result Date: 07/21/2019  Lower Venous DVTStudy Indications: Pulmonary embolism.  Comparison Study: no prior Performing Technologist: Blanch Media RVS  Examination Guidelines: A complete evaluation includes B-mode imaging, spectral Doppler, color Doppler, and power Doppler as needed of all accessible portions of each vessel. Bilateral testing is considered an integral part of a complete examination. Limited examinations for reoccurring indications may be performed as noted. The reflux portion of the exam is performed with the patient in reverse Trendelenburg.  +---------+---------------+---------+-----------+----------+--------------+ RIGHT    CompressibilityPhasicitySpontaneityPropertiesThrombus  Aging +---------+---------------+---------+-----------+----------+--------------+ CFV      Full           Yes      Yes                                 +---------+---------------+---------+-----------+----------+--------------+ SFJ      Full                                                        +---------+---------------+---------+-----------+----------+--------------+ FV Prox  Full                                                        +---------+---------------+---------+-----------+----------+--------------+ FV Mid   Full                                                        +---------+---------------+---------+-----------+----------+--------------+ FV DistalFull                                                        +---------+---------------+---------+-----------+----------+--------------+ PFV      Full                                                        +---------+---------------+---------+-----------+----------+--------------+ POP      Full           Yes      Yes                                  +---------+---------------+---------+-----------+----------+--------------+ PTV      Full                                                        +---------+---------------+---------+-----------+----------+--------------+ PERO     Full                                                        +---------+---------------+---------+-----------+----------+--------------+   +---------+---------------+---------+-----------+----------+--------------+ LEFT     CompressibilityPhasicitySpontaneityPropertiesThrombus Aging +---------+---------------+---------+-----------+----------+--------------+ CFV      Full           Yes      Yes                                 +---------+---------------+---------+-----------+----------+--------------+  SFJ      Full                                                        +---------+---------------+---------+-----------+----------+--------------+ FV Prox  Full                                                        +---------+---------------+---------+-----------+----------+--------------+ FV Mid   Full                                                        +---------+---------------+---------+-----------+----------+--------------+ FV DistalFull                                                        +---------+---------------+---------+-----------+----------+--------------+ PFV      Full                                                        +---------+---------------+---------+-----------+----------+--------------+ POP      Full           Yes      Yes                                 +---------+---------------+---------+-----------+----------+--------------+ PTV      Full                                                        +---------+---------------+---------+-----------+----------+--------------+ PERO     Full                                                         +---------+---------------+---------+-----------+----------+--------------+     Summary: BILATERAL: - No evidence of deep vein thrombosis seen in the lower extremities, bilaterally.   *See table(s) above for measurements and observations.    Preliminary     ASSESSMENT: Active Problems:   Inguinal hernia, right   Lesion of right lung   Pulmonary abscess (HCC)   Emphysema of lung (HCC)   Bronchiectasis (HCC)   Pneumonia due to organism   Sigmoid thickening   Pulmonary emboli (HCC)   RLL pneumonia Discussion: 64 year old male 1 pack/day smoker since age 57 who is currently unemployed and safe Therapist, art.  He notes last week that he has severe back  and right flank pain that was incapacitating.  He also notes on 07/17/2019 he had fever, chills and a productive cough with some mild hemoptysis.  The pain was so bad he decided to seek medical care.  He does have an unknown reduce inguinal hernia as it is been his main concern over the last years. 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  He had bulky aspirated material in the right mainstem bronchus.  A right infrascapular spiculated 6 cm consolidation multiple internal loculated fluid components along with air bronchograms.  These were concerning for possible malignancy.  He does have a pulmonary embolism. CT the abdomen revealed uncomplicated colitis of the proximal descending colon.  He was noted to have multiple liver lesions along with borderline hepatomegaly. Pulmonary critical care was asked to evaluate on 07/21/2019 for possible interventions.  Liver lesions will be the ideal for tissue biopsy and for staging of cancer.  Any attempted biopsy of lung material could lead to fistula and further complications. He notes that he drinks several beers a day.  He does smoke a pack of cigarettes a day.  He denies illicit drug  use.      PLAN: Currently on heparin for pulmonary embolism. Will need to be stopped prior to any type of tissue biopsy. Need for continued anticoagulation to be decided. Suggested liver lesion biopsy for staging of possible cancer.  Brett Canales Lauren Modisette ACNP Acute Care Nurse Practitioner [facility]/Critical [facility] Please consult Amion 07/21/2019, 11:22 AM

## 2019-07-21 NOTE — Progress Notes (Signed)
Pharmacy - IV heparin  Assessment:    Please see note from Greer Pickerel, PharmD earlier today for full details.  Briefly, 64 y.o. male on IV heparin for new PE   Repeat heparin level remains undetectable despite increase in rate to 1100 units/hr (~18 units/kg/hr)  Per RN, no bleeding or line issues (IV site infiltration, pump malfunction, leak) that would explain low heparin levels  Per discussion with MD, patient may still need some procedure in < 24 hr, wanted to continue with heparin for now and may switch to Lovenox tomorrow once need for procedures determined  Plan:   Heparin bolus 4000 units x 1  Increase heparin rate to 1500 units/hr (~26 units/kg/hr)  Recheck heparin level in 6 hrs - if this remains undetectable recommend alternative anticoag  Bernadene Person, PharmD, BCPS [phone] 07/21/2019, 6:30 PM

## 2019-07-22 ENCOUNTER — Inpatient Hospital Stay (HOSPITAL_COMMUNITY): Payer: Self-pay

## 2019-07-22 DIAGNOSIS — K639 Disease of intestine, unspecified: Secondary | ICD-10-CM

## 2019-07-22 LAB — CBC
HCT: 38.2 % — ABNORMAL LOW (ref 39.0–52.0)
Hemoglobin: 12.6 g/dL — ABNORMAL LOW (ref 13.0–17.0)
MCH: 28.5 pg (ref 26.0–34.0)
MCHC: 33 g/dL (ref 30.0–36.0)
MCV: 86.4 fL (ref 80.0–100.0)
Platelets: 446 10*3/uL — ABNORMAL HIGH (ref 150–400)
RBC: 4.42 MIL/uL (ref 4.22–5.81)
RDW: 13.6 % (ref 11.5–15.5)
WBC: 13.4 10*3/uL — ABNORMAL HIGH (ref 4.0–10.5)
nRBC: 0 % (ref 0.0–0.2)

## 2019-07-22 LAB — MAGNESIUM: Magnesium: 1.9 mg/dL (ref 1.7–2.4)

## 2019-07-22 LAB — BODY FLUID CELL COUNT WITH DIFFERENTIAL
Eos, Fluid: 5 %
Lymphs, Fluid: 10 %
Monocyte-Macrophage-Serous Fluid: 11 % — ABNORMAL LOW (ref 50–90)
Neutrophil Count, Fluid: 74 % — ABNORMAL HIGH (ref 0–25)
Other Cells, Fluid: 0 %
Total Nucleated Cell Count, Fluid: UNDETERMINED cu mm (ref 0–1000)

## 2019-07-22 LAB — BASIC METABOLIC PANEL
Anion gap: 8 (ref 5–15)
BUN: 9 mg/dL (ref 8–23)
CO2: 25 mmol/L (ref 22–32)
Calcium: 8.2 mg/dL — ABNORMAL LOW (ref 8.9–10.3)
Chloride: 103 mmol/L (ref 98–111)
Creatinine, Ser: 0.7 mg/dL (ref 0.61–1.24)
GFR calc Af Amer: >60 mL/min (ref >60)
GFR calc non Af Amer: >60 mL/min (ref >60)
Glucose, Bld: 112 mg/dL — ABNORMAL HIGH (ref 70–99)
Potassium: 3.7 mmol/L (ref 3.5–5.1)
Sodium: 136 mmol/L (ref 135–145)

## 2019-07-22 LAB — URINE CULTURE

## 2019-07-22 LAB — PROTEIN, PLEURAL OR PERITONEAL FLUID: Total protein, fluid: 5.2 g/dL

## 2019-07-22 LAB — HEPARIN LEVEL (UNFRACTIONATED)
Heparin Unfractionated: 0.63 IU/mL (ref 0.30–0.70)
Heparin Unfractionated: 0.62 IU/mL (ref 0.30–0.70)

## 2019-07-22 MED ORDER — LORAZEPAM 1 MG PO TABS: 1.0000 mg | ORAL_TABLET | ORAL | Status: DC | PRN

## 2019-07-22 MED ORDER — LIDOCAINE HCL 1 % IJ SOLN
INTRAMUSCULAR | Status: AC
  Filled 2019-07-22: qty 20

## 2019-07-22 MED ORDER — GUAIFENESIN-DM 100-10 MG/5ML PO SYRP
5.0000 mL | ORAL_SOLUTION | ORAL | Status: DC | PRN
  Administered 2019-07-22: 5 mL via ORAL
  Filled 2019-07-22: qty 10

## 2019-07-22 NOTE — Procedures (Signed)
  Procedure: [place] R thoracentesis blood tinged EBL:   minimal Complications:  none immediate  See full dictation in [employer].  Thora Lance MD Main # [phone] Pager  [phone]

## 2019-07-22 NOTE — Plan of Care (Signed)

## 2019-07-22 NOTE — Progress Notes (Signed)
PROGRESS NOTE  BROC CASPERS DJT:[id number] DOB: [birth year] DOA: 07/20/2019 PCP: Cain Saupe, MD   LOS: 2 days   Brief narrative: As per HPI,  Marcus Pace is a 64 y.o. male with medical history significant of inguinal hernia right, h/o bacteremia in the past, who was recently seen in the ED for  left leg pain. For the past week or so he has had right chest/flank pain which was worse with movement, deep breathing, cough. He reported chills and sweats, hemoptysis over the weekend, describing small clots of blood. He is a long term smoker, 20 packyears, but has never been diagnosed with chronic lung disease.  He denied any weight loss, having always been small stature. He does admit to a change in bowel habit with increasing constipation and small caliber stools but denies hematochezia. He does drink 60-80 ounces of beer daily and has for a long time. He presented to [facility] for evaluation.  In the ED, BP was stable, he was noted to be tachycardic and tachypneic on exam and had a low grade fever at 99.5. Lab revealed leukocytosis at 15.8 with normal diff. Initial lactic acid was 2.1. Code sepsis initiated and he was given IV antibiotics and IVF. CXR revealed  8x4 cm lobulated lesion RLL. CT chest/CTA revealed emphysema, bronchiectasis, 6 cm spiculated mass RLL at surface of the lung with a small loculated pleural effusion concerning for abscess vs malignancy, non-occlusive infrahilar pulmonary embolism. CT abdomen with 4 lesions hemangioma vs mets, non-distended sigmoid colon. Patient referred to [facility] for admission to treat possible pulmonary infection and to workup lung lesion, liver lesions, sigmoid colon abnormality.  Assessment/Plan:  Principal Problem:   Lesion of right lung Active Problems:   Inguinal hernia, right   Pulmonary abscess (HCC)   Emphysema of lung (HCC)   Bronchiectasis (HCC)   Pneumonia due to organism   Sigmoid thickening   Pulmonary emboli (HCC)   RLL  pneumonia  Right infrahilar spiculated lung mass , loculated effusion.  Possibility of underlying mass with abscess/postobstructive pneumonia/loculated effusion.  CT scan also showing centrilobular emphysema with bilateral bronchiectasis.  Continue on broad-spectrum antibiotic including vanco, Zosyn azithromycin. Patient was seen by pulmonary in consultation. Recommend IR guided pleural fluid therapy/bony lesion biopsy.  Patient is a chronic smoker with pulmonary symptoms. Check sputum for culture and sensitivity, sputum culture showing few gram-positive cocci in chains and rods. Spoke with interventional radiology regarding lung findings including the sclerotic lesion.  Sclerotic lesion in the sacrum is probably difficult to access and will have low yield.  He suggested taking sample from the effusion.  Chronic cigarette smoker.  Smokes half pack a day. Continue nicotine patch.  8 mm sclerotic lesion in the right S1.  Uncertain significance at this time.  Nonocclusive peripheral pulmonary emboli on the right side.  On heparin drip at this time.  Check lower extremity duplex ultrasound.  Preliminary report shows no evidence of DVT.  Liver lesions - mets vs hemangioma as per CT scan.  MRI did not show any discrete masses.  No evidence of of cirrhosis.  Mild proximal descending colitis/nondistended colon.  Change in stool caliber.  Possibility that the patient might need a GI evaluation at some point.  Alcohol use disorder.  No withdrawal symptoms. Drinks beer every day.  Patient on Ativan as needed.  Continue thiamine folic acid   VTE Prophylaxis: Heparin drip  Code Status: Full code  Family Communication:  I spoke with the patient's sister and updated her  about the clinical condition of the patient.    Disposition Plan:  . Patient is from home, living with his sister. . Likely disposition to home likely in 2 to 3 days . Barriers to discharge:  IV antibiotics, further work-up, possible  need for tissue biopsy, IR for pleural tapping  Consultants:  Pulmonary  IR  Procedures:  IR  Antibiotics:  . Azithromycin, Zosyn, vancomycin iv  Anti-infectives (From admission, onward)   Start     Dose/Rate Route Frequency Ordered Stop   07/22/19 0600  vancomycin (VANCOREADY) IVPB 750 mg/150 mL     750 mg 150 mL/hr over 60 Minutes Intravenous Every 12 hours 07/21/19 1243     07/21/19 2000  azithromycin (ZITHROMAX) tablet 250 mg     250 mg Oral Daily 07/20/19 2234 07/25/19 1959   07/21/19 1300  vancomycin (VANCOREADY) IVPB 1250 mg/250 mL     1,250 mg 166.7 mL/hr over 90 Minutes Intravenous  Once 07/21/19 1230 07/21/19 1557   07/21/19 0600  ceFEPIme (MAXIPIME) 2 g in sodium chloride 0.9 % 100 mL IVPB  Status:  Discontinued     2 g 200 mL/hr over 30 Minutes Intravenous Every 8 hours 07/21/19 0012 07/21/19 0037   07/21/19 0200  piperacillin-tazobactam (ZOSYN) IVPB 3.375 g     3.375 g 12.5 mL/hr over 240 Minutes Intravenous Every 8 hours 07/21/19 0037     07/20/19 2230  ceFEPIme (MAXIPIME) 1 g in sodium chloride 0.9 % 100 mL IVPB  Status:  Discontinued     1 g 200 mL/hr over 30 Minutes Intravenous Every 8 hours 07/20/19 2234 07/21/19 0012   07/20/19 2000  ceFEPIme (MAXIPIME) 1 g in sodium chloride 0.9 % 100 mL IVPB  Status:  Discontinued     1 g 200 mL/hr over 30 Minutes Intravenous STAT 07/20/19 1952 07/20/19 1956   07/20/19 2000  ceFEPIme (MAXIPIME) 2 g in sodium chloride 0.9 % 100 mL IVPB     2 g 200 mL/hr over 30 Minutes Intravenous STAT 07/20/19 1956 07/20/19 2109   07/20/19 1815  cefTRIAXone (ROCEPHIN) 1 g in sodium chloride 0.9 % 100 mL IVPB     1 g 200 mL/hr over 30 Minutes Intravenous  Once 07/20/19 1807 07/20/19 2000   07/20/19 1815  azithromycin (ZITHROMAX) tablet 500 mg     500 mg Oral  Once 07/20/19 1807 07/20/19 2000     Subjective:  Today, patient was seen and examined at bedside.  Still complains of cough and chest discomfort on coughing.  Has mild flank  pain.  No hemoptysis.    Objective: Vitals:   07/21/19 2006 07/22/19 0446  BP: 129/80 119/78  Pulse: 92 90  Resp: (!) 22 (!) 23  Temp: 97.9 F (36.6 C) 98.6 F (37 C)  SpO2: 97% 97%    Intake/Output Summary (Last 24 hours) at 07/22/2019 1022 Last data filed at 07/22/2019 0629 Gross per 24 hour  Intake 1109.53 ml  Output 1425 ml  Net -315.47 ml   Filed Weights   07/21/19 0120  Weight: 58.2 kg   Body mass index is 16.93 kg/m.   Physical Exam: GENERAL: Patient is alert awake and oriented. Not in obvious distress.  Thinly built HENT: No scleral pallor or icterus. Pupils equally reactive to light. Oral mucosa is moist NECK: is supple, no gross swelling noted. CHEST: Coarse breath sounds noted bilaterally.   CVS: S1 and S2 heard, no murmur. Regular rate and rhythm.  ABDOMEN: Soft, non-tender, bowel sounds are present. EXTREMITIES:  No edema. CNS: Cranial nerves are intact. No focal motor deficits. SKIN: warm and dry without rashes.  Data Review: I have personally reviewed the following laboratory data and studies,  CBC: Recent Labs  Lab 07/20/19 1623 07/21/19 0553 07/22/19 0053  WBC 15.8* 14.2* 13.4*  NEUTROABS 12.2*  --   --   HGB 15.7 13.2 12.6*  HCT 47.4 39.7 38.2*  MCV 86.0 86.5 86.4  PLT 506* 427* 446*   Basic Metabolic Panel: Recent Labs  Lab 07/20/19 1623 07/21/19 0553 07/22/19 0053  NA 135 136 136  K 3.7 4.1 3.7  CL 96* 104 103  CO2 27 24 25   GLUCOSE 109* 116* 112*  BUN 9 11 9   CREATININE 0.84 0.78 0.70  CALCIUM 9.0 8.4* 8.2*  MG  --   --  1.9   Liver Function Tests: Recent Labs  Lab 07/20/19 1623  AST 38  ALT 67*  ALKPHOS 106  BILITOT 1.0  PROT 8.5*  ALBUMIN 3.2*   Recent Labs  Lab 07/20/19 1623  LIPASE 15   No results for input(s): AMMONIA in the last 168 hours. Cardiac Enzymes: No results for input(s): CKTOTAL, CKMB, CKMBINDEX, TROPONINI in the last 168 hours. BNP (last 3 results) No results for input(s): BNP in the last 8760  hours.  ProBNP (last 3 results) No results for input(s): PROBNP in the last 8760 hours.  CBG: No results for input(s): GLUCAP in the last 168 hours. Recent Results (from the past 240 hour(s))  Urine Culture     Status: Abnormal   Collection Time: 07/20/19  3:55 PM   Specimen: Urine, Random  Result Value Ref Range Status   Specimen Description   Final    URINE, RANDOM Performed at [facility], [address], [place], [place]    Special Requests   Final    NONE Performed at [facility], [address], [place], [place]    Culture MULTIPLE SPECIES PRESENT, SUGGEST RECOLLECTION (A)  Final   Report Status 07/22/2019 FINAL  Final  SARS CORONAVIRUS 2 (TAT 6-24 HRS) Nasopharyngeal Nasopharyngeal Swab     Status: None   Collection Time: 07/20/19  4:23 PM   Specimen: Nasopharyngeal Swab  Result Value Ref Range Status   SARS Coronavirus 2 NEGATIVE NEGATIVE Final    Comment: (NOTE) SARS-CoV-2 target nucleic acids are NOT DETECTED. The SARS-CoV-2 RNA is generally detectable in upper and lower respiratory specimens during the acute phase of infection. Negative results do not preclude SARS-CoV-2 infection, do not rule out co-infections with other pathogens, and should not be used as the sole basis for treatment or other patient management decisions. Negative results must be combined with clinical observations, patient history, and epidemiological information. The expected result is Negative. Fact Sheet for Patients: 07/24/2019 Fact Sheet for Healthcare Providers: 07/22/19 This test is not yet approved or cleared by the [employer] FDA and  has been authorized for detection and/or diagnosis of SARS-CoV-2 by FDA under an Emergency Use Authorization (EUA). This EUA will remain  in effect (meaning this test can be used) for the duration of the COVID-19 declaration  under Section 56 4(b)(1) of the Act, 21 U.S.C. section 360bbb-3(b)(1), unless the authorization is terminated or revoked sooner. Performed at [facility], [address] [place], [address] [place]   Culture, blood (single)     Status: None (Preliminary result)   Collection Time: 07/20/19  4:23 PM   Specimen: BLOOD LEFT FOREARM  Result Value Ref Range Status   Specimen Description   Final    BLOOD LEFT FOREARM Performed at [facility], [address], [place], [place] [address]    Special Requests   Final    BOTTLES DRAWN AEROBIC ONLY Blood Culture results may not be optimal due to an inadequate volume of blood received in culture bottles Performed at [facility], [address], [place], [place] [address]    Culture   Final    NO GROWTH 2 DAYS Performed at [facility], [address] [place], [place] [address]    Report Status PENDING  Incomplete  Culture, blood (single)     Status: None (Preliminary result)   Collection Time: 07/21/19  5:53 AM   Specimen: BLOOD  Result Value Ref Range Status   Specimen Description   Final    BLOOD RIGHT ANTECUBITAL Performed at [facility], [address], [place], [place] [address]    Special Requests   Final    BOTTLES DRAWN AEROBIC ONLY Blood Culture adequate volume Performed at [facility], [address] [place], [place] [address]    Culture   Final    NO GROWTH < 24 HOURS Performed at [facility], [address] [place], [place] [address]    Report Status PENDING  Incomplete  MRSA PCR Screening     Status: None   Collection Time: 07/21/19 12:32 PM   Specimen: Nasal Mucosa; Nasopharyngeal  Result Value Ref Range Status   MRSA by PCR NEGATIVE NEGATIVE Final    Comment:        The GeneXpert MRSA Assay (FDA approved for NASAL specimens only), is one component of a comprehensive MRSA colonization surveillance program. It is  not intended to diagnose MRSA infection nor to guide or monitor treatment for MRSA infections. Performed at [facility], [address] [place], [place] [address]   Expectorated sputum assessment w rflx to resp cult     Status: None   Collection Time: 07/21/19  5:30 PM   Specimen: Sputum  Result Value Ref Range Status   Specimen Description SPU  Final   Special Requests NONE  Final   Sputum evaluation   Final    THIS SPECIMEN IS ACCEPTABLE FOR SPUTUM CULTURE Performed at [facility], [address], [place], [place] [address]    Report Status 07/21/2019 FINAL  Final  Culture, respiratory     Status: None (Preliminary result)   Collection Time: 07/21/19  5:30 PM   Specimen: Sputum  Result Value Ref Range Status   Specimen Description   Final    SPU Performed at [facility], [address], [place], [place] [address]    Special Requests   Final    NONE Reflexed from [id number] Performed at [facility], [address], [place], [place] [address]    Gram Stain   Final    RARE WBC PRESENT, PREDOMINANTLY PMN FEW GRAM POSITIVE COCCI IN PAIRS IN CHAINS FEW GRAM POSITIVE RODS Performed at [facility], [address], [place], [place] [address]    Culture PENDING  Incomplete   Report Status PENDING  Incomplete     Studies: CT Chest W Contrast  Addendum Date: 07/20/2019   ADDENDUM REPORT: 07/20/2019 19:07 ADDENDUM: I discussed critical value/emergent results and the need for additional follow-up imaging by telephone at the time of interpretation on 07/20/2019 at  7:03 pm with the patient's provider Dr. Gershon Cull, who verbally acknowledged these results. Electronically Signed   By: Revonda Humphrey   On: 07/20/2019 19:07   Result Date: 07/20/2019 CLINICAL DATA:  Left lower back pain radiating down the left leg for the past 2 weeks. Possible cavitary pulmonary lesion. EXAM: CT CHEST, ABDOMEN, AND PELVIS WITH  CONTRAST TECHNIQUE: Multidetector CT imaging of the chest, abdomen and pelvis was performed following the standard protocol during bolus administration of intravenous contrast. Automatic exposure control utilized. CONTRAST:  140mL OMNIPAQUE IOHEXOL 300 MG/ML  SOLN COMPARISON:  None. Chest radiography acquired earlier on the same date. FINDINGS: CT CHEST FINDINGS Cardiovascular: Normal heart size. Four-vessel coronary calcification moderate along the LAD ostia. No thoracic aortic aneurysm. Patent central and lobar pulmonary arteries. Punctate central contrast filling defects in the right infrahilar segmental branches. Mediastinum/Nodes: Borderline enlarged right hilar noncalcified lymph node, 11 x 18 mm. Nonspecific 11 mm short axis diameter subcarinal and subcentimeter mediastinal lymph nodes. Lungs/Pleura: Moderate centrilobular emphysema with bilateral bronchiectasis most pronounced in the lung bases. Bulky aspirated material in the right mainstem bronchus. Right infrahilar spiculated 6 cm consolidation with multiple internal loculated fluid components and traversing air bronchograms and peripheral interlobular septal thickening corresponding to the prior radiographic abnormality, possibly infectious and/or malignant or ischemic. No associated cavitary lesion. Partially loculated small right pleural effusion. No left pleural effusion. Musculoskeletal: Moderate skeletal degenerative change. No cortical erosion or suspicious osseous lesion of the thoracic spine or ribcage or sternum., Partially loculated along the right lateral and posterior chest wall. CT ABDOMEN PELVIS FINDINGS Hepatobiliary: An 11 mm hypervascular lesion in the right hepatic dome on axial series 2, image 52 and coronal series 7, image 72. A second hypervascular lesion in the posterior right hemi liver measuring 9 x 7 mm on coronal image 97 and axial image 67. A third hypervascular lesion measuring 15 x 11 mm at the junction of the right and left  hemi liver on axial image 60 and coronal image 45. A fourth hypervascular lesion in the caudate lobe measuring 11 x 8 mm on axial image 68 and coronal image 57. Borderline hepatomegaly with normal smooth hepatic contours. Pancreas: No acute pancreatitis or main ductal dilatation. Spleen: Normal. Adrenals/Urinary Tract: Normal adrenal glands. No apparent abnormality of the urinary bladder or bilateral ureters. Subcentimeter left renal cortical cysts, and otherwise normal bilateral kidneys. Stomach/Bowel: Mild nonspecific colitis of the proximal descending colon along the left flank without pericolonic abscess or bowel perforation. Several nondistended versus non distensible short segments in the sigmoid colon. No bowel obstruction. Probably decompressed appendix, sagittal image 73. Decompressed stomach and small bowel without an apparent mucosal abnormality or obstruction. Right indirect inguinal herniation of elongated mesentery, mesenteric vasculature, and small bowel loops through a wide 4.5 cm defect with extension into the scrotal sac, but without apparent strangulation or incarceration. Vascular/Lymphatic: Areas of noncontrast opacification in the left renal vein and inferior vena cava extending into the right cardiac atria which is probably secondary to contrast bolus timing given the unenhanced hepatic and more distal ileocaval veins. Infundibulum at the superior mesenteric artery ostium. Abdominal aorta minimal calcified atherosclerosis without aneurysmal dilatation. Nonspecific small retroperitoneal lymph nodes. Reproductive: Prostatomegaly. Other: Mild dermal thickening overlying the sacrum without discrete ulceration or osseous erosion or abscess. Musculoskeletal: Moderate skeletal degenerative change and diffuse bone demineralization. A couple punctate sclerotic lesion in the hips and bony pelvis. An 8 mm sclerotic lesion in the right S1 segment similar to the July 08, 2019 spine  radiography, but new  since the May 28, 2004 spine radiography, concerning for an osteoblastic lesion. IMPRESSION: A mild uncomplicated colitis of the proximal descending colon. Short segments of nondistended versus non-distensible sigmoid colon. Correlation with colonoscopy recommended to exclude an underlying colonic malignancy. Right infrahilar spiculated lung mass with multiple internal cystic lesions, but no associated cavitation at this time. An infectious pulmonary abscess, malignant, or less likely ischemic etiologies are differential considerations. Small right partially loculated pleural effusion, possibly benign or malignant. Nonspecific mildly enlarged right hilar lymph node. Unenhanced CT chest recommended in 6-8 weeks to document any interval response to therapy. Tissue sampling could be considered. Nonocclusive peripheral pulmonary emboli in the right infrahilar region. Patent central pulmonary arteries without right heart strain. Several small hypervascular hepatic lesions, incompletely characterized on single-phase enhanced imaging measuring up to 15 mm diameter, possibly benign hemangiomas but a malignant etiology is not excluded. Borderline hepatomegaly without cirrhosis. Further characterization with MR abdomen without and with intravenous contrast liver protocol recommended. 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 Four-vessel coronary calcification. Large right indirect inguinal herniation of mesentery and small bowel loops through a wide neck without strangulation or incarceration. Prostatomegaly. Centrilobular emphysema with bilateral bronchiectasis, aspiration, and aortic calcified atherosclerosis. Electronically Signed: By: Laurence Ferrari On: 07/20/2019 19:00   CT ABDOMEN PELVIS W CONTRAST  Addendum Date: 07/20/2019   ADDENDUM REPORT: 07/20/2019 19:07 ADDENDUM: I  discussed critical value/emergent results and the need for additional follow-up imaging by telephone at the time of interpretation on 07/20/2019 at 7:03 pm with the patient's provider Dr. Landis Gandy, who verbally acknowledged these results. Electronically Signed   By: Laurence Ferrari   On: 07/20/2019 19:07   Result Date: 07/20/2019 CLINICAL DATA:  Left lower back pain radiating down the left leg for the past 2 weeks. Possible cavitary pulmonary lesion. EXAM: CT CHEST, ABDOMEN, AND PELVIS WITH CONTRAST TECHNIQUE: Multidetector CT imaging of the chest, abdomen and pelvis was performed following the standard protocol during bolus administration of intravenous contrast. Automatic exposure control utilized. CONTRAST:  OMNIPAQUE IOHEXOL 300 MG/ML  SOLN COMPARISON:  None. Chest radiography acquired earlier on the same date. FINDINGS: CT CHEST FINDINGS Cardiovascular: Normal heart size. Four-vessel coronary calcification moderate along the LAD ostia. No thoracic aortic aneurysm. Patent central and lobar pulmonary arteries. Punctate central contrast filling defects in the right infrahilar segmental branches. Mediastinum/Nodes: Borderline enlarged right hilar noncalcified lymph node, 11 x 18 mm. Nonspecific 11 mm short axis diameter subcarinal and subcentimeter mediastinal lymph nodes. Lungs/Pleura: Moderate centrilobular emphysema with bilateral bronchiectasis most pronounced in the lung bases. Bulky aspirated material in the right mainstem bronchus. Right infrahilar spiculated 6 cm consolidation with multiple internal loculated fluid components and traversing air bronchograms and peripheral interlobular septal thickening corresponding to the prior radiographic abnormality, possibly infectious and/or malignant or ischemic. No associated cavitary lesion. Partially loculated small right pleural effusion. No left pleural effusion. Musculoskeletal: Moderate skeletal degenerative change. No cortical erosion or suspicious osseous  lesion of the thoracic spine or ribcage or sternum., Partially loculated along the right lateral and posterior chest wall. CT ABDOMEN PELVIS FINDINGS Hepatobiliary: An 11 mm hypervascular lesion in the right hepatic dome on axial series 2, image 52 and coronal series 7, image 72. A second hypervascular lesion in the posterior right hemi liver measuring 9 x 7 mm on coronal image 97 and axial image 67. A  third hypervascular lesion measuring 15 x 11 mm at the junction of the right and left hemi liver on axial image 60 and coronal image 45. A fourth hypervascular lesion in the caudate lobe measuring 11 x 8 mm on axial image 68 and coronal image 57. Borderline hepatomegaly with normal smooth hepatic contours. Pancreas: No acute pancreatitis or main ductal dilatation. Spleen: Normal. Adrenals/Urinary Tract: Normal adrenal glands. No apparent abnormality of the urinary bladder or bilateral ureters. Subcentimeter left renal cortical cysts, and otherwise normal bilateral kidneys. Stomach/Bowel: Mild nonspecific colitis of the proximal descending colon along the left flank without pericolonic abscess or bowel perforation. Several nondistended versus non distensible short segments in the sigmoid colon. No bowel obstruction. Probably decompressed appendix, sagittal image 73. Decompressed stomach and small bowel without an apparent mucosal abnormality or obstruction. Right indirect inguinal herniation of elongated mesentery, mesenteric vasculature, and small bowel loops through a wide 4.5 cm defect with extension into the scrotal sac, but without apparent strangulation or incarceration. Vascular/Lymphatic: Areas of noncontrast opacification in the left renal vein and inferior vena cava extending into the right cardiac atria which is probably secondary to contrast bolus timing given the unenhanced hepatic and more distal ileocaval veins. Infundibulum at the superior mesenteric artery ostium. Abdominal aorta minimal calcified  atherosclerosis without aneurysmal dilatation. Nonspecific small retroperitoneal lymph nodes. Reproductive: Prostatomegaly. Other: Mild dermal thickening overlying the sacrum without discrete ulceration or osseous erosion or abscess. Musculoskeletal: Moderate skeletal degenerative change and diffuse bone demineralization. A couple punctate sclerotic lesion in the hips and bony pelvis. An 8 mm sclerotic lesion in the right S1 segment similar to the July 08, 2019 spine radiography, but new since the May 28, 2004 spine radiography, concerning for an osteoblastic lesion. IMPRESSION: A mild uncomplicated colitis of the proximal descending colon. Short segments of nondistended versus non-distensible sigmoid colon. Correlation with colonoscopy recommended to exclude an underlying colonic malignancy. Right infrahilar spiculated lung mass with multiple internal cystic lesions, but no associated cavitation at this time. An infectious pulmonary abscess, malignant, or less likely ischemic etiologies are differential considerations. Small right partially loculated pleural effusion, possibly benign or malignant. Nonspecific mildly enlarged right hilar lymph node. Unenhanced CT chest recommended in 6-8 weeks to document any interval response to therapy. Tissue sampling could be considered. Nonocclusive peripheral pulmonary emboli in the right infrahilar region. Patent central pulmonary arteries without right heart strain. Several small hypervascular hepatic lesions, incompletely characterized on single-phase enhanced imaging measuring up to 15 mm diameter, possibly benign hemangiomas but a malignant etiology is not excluded. Borderline hepatomegaly without cirrhosis. Further characterization with MR abdomen without and with intravenous contrast liver protocol recommended. An 8 mm sclerotic lesion in the right S1 sacrum, which appears new compared to the January 2006 lumbar spine radiography, which is concerning for an  osteoblastic metastatic etiology. Four-vessel coronary calcification. Large right indirect inguinal herniation of mesentery and small bowel loops through a wide neck without strangulation or incarceration. Prostatomegaly. Centrilobular emphysema with bilateral bronchiectasis, aspiration, and aortic calcified atherosclerosis. Electronically Signed: By: Laurence Ferrariachel  [place] On: 07/20/2019 19:00   MR LIVER W WO CONTRAST  Result Date: 07/21/2019 CLINICAL DATA:  Indeterminate liver lesions on CT EXAM: MRI ABDOMEN WITHOUT AND WITH CONTRAST TECHNIQUE: Multiplanar multisequence MR imaging of the abdomen was performed both before and after the administration of intravenous contrast. CONTRAST:  5mL GADAVIST GADOBUTROL 1 MMOL/ML IV SOLN COMPARISON:  07/20/2019 abdominal CT FINDINGS: Portions of exam are mildly motion degraded. Lower chest: Small right pleural effusion. Right lower  lobe process was detailed on CT. Hepatobiliary: No cirrhosis. No T2 hyperintense lesions or areas of restricted diffusion. Arterial phase images demonstrate multiple subcentimeter foci of hyperenhancement, including on series 19. Late arterial and early portal venous phase apparent hyperenhancement within the left hepatic lobe, including on 25/21, is favored to be due to perfusion anomaly (and possibly mild artifact). No convincing evidence of underlying lesion in this area. Normal gallbladder, without biliary ductal dilatation. Pancreas:  Normal, without mass or ductal dilatation. Spleen:  Normal in size, without focal abnormality. Adrenals/Urinary Tract: Normal adrenal glands. Interpolar left renal cyst. Normal right kidney. Stomach/Bowel: Normal stomach and small bowel loops. Large colonic stool burden. Vascular/Lymphatic: Aortic atherosclerosis. No abdominal adenopathy. Other:  No ascites. Musculoskeletal: Convex right lumbar spine curvature. IMPRESSION: 1. Motion degraded exam. 2. Areas of arterial phase hyperenhancement throughout the liver,  without correlate on other pulse sequences. Favored to represent perfusion anomalies. Given motion limitations of the current exam, outpatient follow-up MRI at 3 months should be considered. 3. Small right pleural effusion and right lower lobe lung process, as on CT. 4.  Aortic Atherosclerosis (ICD10-I70.0). Electronically Signed   By: Jeronimo Greaves M.D.   On: 07/21/2019 10:08   DG Chest Port 1 View  Result Date: 07/20/2019 CLINICAL DATA:  Cough and weakness. EXAM: PORTABLE CHEST 1 VIEW COMPARISON:  August 02, 2015 FINDINGS: An 8.8 cm x 4.3 cm mildly lobulated opacity is seen overlying the lateral aspect of the mid to lower right lung. This area contains a 2.3 cm x 1.6 cm focal lucency. There is no evidence of a pleural effusion or pneumothorax. The heart size and mediastinal contours are within normal limits. The visualized skeletal structures are unremarkable. IMPRESSION: 8.8 cm x 4.3 cm mildly lobulated opacity overlying the lateral aspect of the mid to lower right lung. While this may represent a cavitary pneumonia, a cavitary lung mass cannot be excluded. Correlation with chest CT is recommended. Electronically Signed   By: Aram Candela M.D.   On: 07/20/2019 16:32   VAS [place] LOWER EXTREMITY VENOUS (DVT)  Result Date: 07/21/2019  Lower Venous DVTStudy Indications: Pulmonary embolism.  Comparison Study: no prior Performing Technologist: Blanch Media RVS  Examination Guidelines: A complete evaluation includes B-mode imaging, spectral Doppler, color Doppler, and power Doppler as needed of all accessible portions of each vessel. Bilateral testing is considered an integral part of a complete examination. Limited examinations for reoccurring indications may be performed as noted. The reflux portion of the exam is performed with the patient in reverse Trendelenburg.  +---------+---------------+---------+-----------+----------+--------------+ RIGHT    CompressibilityPhasicitySpontaneityPropertiesThrombus  Aging +---------+---------------+---------+-----------+----------+--------------+ CFV      Full           Yes      Yes                                 +---------+---------------+---------+-----------+----------+--------------+ SFJ      Full                                                        +---------+---------------+---------+-----------+----------+--------------+ FV Prox  Full                                                        +---------+---------------+---------+-----------+----------+--------------+  FV Mid   Full                                                        +---------+---------------+---------+-----------+----------+--------------+ FV DistalFull                                                        +---------+---------------+---------+-----------+----------+--------------+ PFV      Full                                                        +---------+---------------+---------+-----------+----------+--------------+ POP      Full           Yes      Yes                                 +---------+---------------+---------+-----------+----------+--------------+ PTV      Full                                                        +---------+---------------+---------+-----------+----------+--------------+ PERO     Full                                                        +---------+---------------+---------+-----------+----------+--------------+   +---------+---------------+---------+-----------+----------+--------------+ LEFT     CompressibilityPhasicitySpontaneityPropertiesThrombus Aging +---------+---------------+---------+-----------+----------+--------------+ CFV      Full           Yes      Yes                                 +---------+---------------+---------+-----------+----------+--------------+ SFJ      Full                                                         +---------+---------------+---------+-----------+----------+--------------+ FV Prox  Full                                                        +---------+---------------+---------+-----------+----------+--------------+ FV Mid   Full                                                        +---------+---------------+---------+-----------+----------+--------------+  FV DistalFull                                                        +---------+---------------+---------+-----------+----------+--------------+ PFV      Full                                                        +---------+---------------+---------+-----------+----------+--------------+ POP      Full           Yes      Yes                                 +---------+---------------+---------+-----------+----------+--------------+ PTV      Full                                                        +---------+---------------+---------+-----------+----------+--------------+ PERO     Full                                                        +---------+---------------+---------+-----------+----------+--------------+     Summary: BILATERAL: - No evidence of deep vein thrombosis seen in the lower extremities, bilaterally.   *See table(s) above for measurements and observations. Electronically signed by Gretta Began MD on 07/21/2019 at 3:51:51 PM.    Final      Joycelyn Das, MD  [facility] 07/22/2019

## 2019-07-22 NOTE — Progress Notes (Signed)
ANTICOAGULATION CONSULT NOTE  Pharmacy Consult for Heparin Indication: pulmonary embolus  No Known Allergies  Patient Measurements: Height: 6\' 1"  (185.4 cm) Weight: 128 lb 4.9 oz (58.2 kg) IBW/kg (Calculated) : 79.9 Heparin Dosing Weight: actual body weight  Vital Signs: Temp: 97.9 F (36.6 C) (03/17 2006) Temp Source: Oral (03/17 2006) BP: 129/80 (03/17 2006) Pulse Rate: 92 (03/17 2006)  Labs: Recent Labs    07/20/19 1623 07/20/19 1623 07/21/19 0553 07/21/19 1539 07/22/19 0053  HGB 15.7   < > 13.2  --  12.6*  HCT 47.4  --  39.7  --  38.2*  PLT 506*  --  427*  --  446*  APTT 34  --   --   --   --   LABPROT 13.9  --   --   --   --   INR 1.1  --   --   --   --   HEPARINUNFRC  --   --  <0.10* <0.10* 0.63  CREATININE 0.84  --  0.78  --  0.70   < > = values in this interval not displayed.    Estimated Creatinine Clearance: 78.8 mL/min (by C-G formula based on SCr of 0.7 mg/dL).   Medical History: Past Medical History:  Diagnosis Date  . Bacteremia    2016 or so with hospitalization  . 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  PMH significant for tobacco and alcohol abuse.  CTAngio = + pulmonary embolism  LE venous dopplers negative for DVT  Today, 07/22/19:  Heparin level = 0.63 units/mL, now therapeutic  CBC: Hgb decreased to 12.6, Pltc elevated at 446K  No bleeding or infusion issues noted per nursing  Goal of Therapy:  Heparin level 0.3-0.7 units/ml Monitor platelets by anticoagulation protocol: Yes   Plan:   Continue heparin infusion at 1500 units/hr  Heparin level in 6 hours to confirm remains in therapeutic range  Daily CBC, heparin level  Monitor closely for s/sx of bleeding   07/24/19, PharmD, BCPS Clinical Pharmacist  07/22/2019,2:22  AM

## 2019-07-22 NOTE — TOC Initial Note (Signed)
Transition of Care [facility]) - Initial/Assessment Note    Patient Details  Name: Marcus Pace MRN: [id number] Date of Birth: 03-May-1956  Transition of Care [facility] CM/SW Contact:    Lennart Pall, LCSW Phone Number: 07/22/2019, 2:19 PM  Clinical Narrative:         Met with pt this morning to screen for potential d/c needs.  Pt confirms that his lives with his sister, Mathews Argyle, who can provide 24/7 support.  His eldest sister, Terrence Dupont, is contact person - "she's the oldest and like the [place] for all of [place]."  He is very pleasant and agreeable to SW support.  Aware MD awaiting results of biopsy in order to determine d/c or treatment plans. Pt confirms is [facility] and [facility] for [facility].  He began receiving SS at age 64 but is uninsured.  Alt[place]e Cabal, [facility] financial counseling, following pt's case and pt hoepful he may qualify for Medicaid.  Will continue to follow.          Expected Discharge Plan: [place] Barriers to Discharge: Continued Medical Work up   Patient Goals and CMS Choice Patient states their goals for this hospitalization and ongoing recovery are:: Pt hopeful for return home with his sister.      Expected Discharge Plan and Services Expected Discharge Plan: [place] In-house Referral: Clinical Social Work     Living arrangements for the past 2 months: [place]                                      Prior Living Arrangements/Services Living arrangements for the past 2 months: [place] Lives with:: Siblings Patient language and need for interpreter reviewed:: Yes Do you feel safe going back to the place where you live?: Yes      Need for Family Participation in Patient Care: Yes (Comment) Care giver support system in place?: Yes (comment)   Criminal Activity/Legal Involvement Pertinent to Current Situation/Hospitalization: No - Comment as needed  Activities  of Daily Living Home Assistive Devices/Equipment: None ADL Screening (condition at time of admission) Patient's cognitive ability adequate to safely complete daily activities?: Yes Is the patient deaf or have difficulty hearing?: No Does the patient have difficulty seeing, even when wearing glasses/contacts?: No Does the patient have difficulty concentrating, remembering, or making decisions?: No Patient able to express need for assistance with ADLs?: Yes Does the patient have difficulty dressing or bathing?: No Independently performs ADLs?: Yes (appropriate for developmental age) Does the patient have difficulty walking or climbing stairs?: No Weakness of Legs: None Weakness of Arms/Hands: None  Permission Sought/Granted Permission sought to share information with : Family Supports Permission granted to share information with : Yes, Verbal Permission Granted  Share Information with NAME: Kathyrn Sheriff     Permission granted to share info w Relationship: sister  Permission granted to share info w Contact Information: [phone]  Emotional Assessment Appearance:: Appears stated age Attitude/Demeanor/Rapport: Engaged, Gracious Affect (typically observed): Pleasant, Hopeful Orientation: : Oriented to Self, Oriented to Place, Oriented to  Time, Oriented to Situation Alcohol / Substance Use: Alcohol Use Psych Involvement: No (comment)  Admission diagnosis:  Colitis [K52.9] Lung mass [R91.8] Gluteal pain [M79.18] RLL pneumonia [J18.9] Acute left-sided low back pain without sciatica [M54.5] Community acquired pneumonia of right middle lobe of lung [J18.9] Sepsis, due to unspecified organism,  unspecified whether acute organ dysfunction present [facility]) [A41.9] Multiple subsegmental pulmonary emboli without acute cor pulmonale ([place]) [I26.94] Patient Active Problem List   Diagnosis Date Noted  . Inguinal hernia, right 07/20/2019  . Lesion of right lung 07/20/2019  . Pulmonary abscess  ([place]) 07/20/2019  . Emphysema of lung ([place]) 07/20/2019  . Bronchiectasis ([place]) 07/20/2019  . Pneumonia due to organism 07/20/2019  . Sigmoid thickening 07/20/2019  . Pulmonary emboli ([place]) 07/20/2019  . RLL pneumonia 07/20/2019  . Malnutrition of moderate degree 08/03/2015  . Influenza B 08/02/2015  . Bacteremia due to Gram-positive bacteria 08/02/2015  . Sepsis ([place]) 08/02/2015  . Tobacco abuse 08/02/2015  . Testicular pain, right 08/02/2015   PCP:  Antony Blackbird, MD Pharmacy:   [facility], [address] AT [address] [place] [address] Phone: [phone] Fax: [place], [address] [place] [address] Phone: [phone] Fax: [phone]     Social Determinants of Health (SDOH) Interventions    Readmission Risk Interventions Readmission Risk Prevention Plan 07/22/2019  Post Dischage Appt Complete  Medication Screening Complete  Transportation Screening Complete  Some recent data might be hidden

## 2019-07-22 NOTE — Progress Notes (Signed)
ANTICOAGULATION CONSULT NOTE  Pharmacy Consult for Heparin Indication: pulmonary embolus  No Known Allergies  Patient Measurements: Height: 6\' 1"  (185.4 cm) Weight: 128 lb 4.9 oz (58.2 kg) IBW/kg (Calculated) : 79.9 Heparin Dosing Weight: n/a. Use TBW of 58 kg  Vital Signs: Temp: 98.6 F (37 C) (03/18 0446) Temp Source: Oral (03/18 0446) BP: 119/78 (03/18 0446) Pulse Rate: 90 (03/18 0446)  Labs: Recent Labs    07/20/19 1623 07/20/19 1623 07/21/19 0553 07/21/19 0553 07/21/19 1539 07/22/19 0053 07/22/19 0759  HGB 15.7   < > 13.2  --   --  12.6*  --   HCT 47.4  --  39.7  --   --  38.2*  --   PLT 506*  --  427*  --   --  446*  --   APTT 34  --   --   --   --   --   --   LABPROT 13.9  --   --   --   --   --   --   INR 1.1  --   --   --   --   --   --   HEPARINUNFRC  --   --  <0.10*   < > <0.10* 0.63 0.62  CREATININE 0.84  --  0.78  --   --  0.70  --    < > = values in this interval not displayed.    Estimated Creatinine Clearance: 78.8 mL/min (by C-G formula based on SCr of 0.7 mg/dL).   Medical History: Past Medical History:  Diagnosis Date  . Bacteremia    2016 or so with hospitalization  . Inguinal hernia recurrent unilateral    herniation of mesentary and loops of bowel to right scrotal sac, 4.5 cm defect      Medications:  No anticoagulants PTA  Assessment: 23 yoM presenting with back pain radiating down leg, CT angio + for PE. LE venous dopplers negative for DVT. Pt on heparin drip for anticoagulation, pharmacy dosing.  Today, 07/22/19:  Confirmatory HL = 0.62 units/mL, remains therapeutic on heparin infusion of 1500 units/hr.   CBC: Hgb decreased slightly to 12.6, Pltc elevated at 446K  Confirmed with RN that heparin infusing at correct rate, no signs of bleeding.   Goal of Therapy:  Heparin level 0.3-0.7 units/ml Monitor platelets by anticoagulation protocol: Yes   Plan:   Continue heparin infusion at 1500 units/hr  Daily CBC, heparin  level  Monitor for s/sx of bleeding  Follow along for potential transition to PO or subQ anticoagulation  07/24/19, PharmD 07/22/19 8:32 AM

## 2019-07-23 ENCOUNTER — Telehealth: Payer: Self-pay | Admitting: Pulmonary Disease

## 2019-07-23 DIAGNOSIS — J439 Emphysema, unspecified: Secondary | ICD-10-CM

## 2019-07-23 DIAGNOSIS — J851 Abscess of lung with pneumonia: Secondary | ICD-10-CM

## 2019-07-23 DIAGNOSIS — J189 Pneumonia, unspecified organism: Secondary | ICD-10-CM

## 2019-07-23 DIAGNOSIS — R911 Solitary pulmonary nodule: Secondary | ICD-10-CM

## 2019-07-23 LAB — GRAM STAIN

## 2019-07-23 LAB — COMPREHENSIVE METABOLIC PANEL
ALT: 117 U/L — ABNORMAL HIGH (ref 0–44)
AST: 89 U/L — ABNORMAL HIGH (ref 15–41)
Albumin: 2.5 g/dL — ABNORMAL LOW (ref 3.5–5.0)
Alkaline Phosphatase: 95 U/L (ref 38–126)
Anion gap: 8 (ref 5–15)
BUN: 10 mg/dL (ref 8–23)
CO2: 28 mmol/L (ref 22–32)
Calcium: 8.9 mg/dL (ref 8.9–10.3)
Chloride: 102 mmol/L (ref 98–111)
Creatinine, Ser: 0.79 mg/dL (ref 0.61–1.24)
GFR calc Af Amer: >60 mL/min (ref >60)
GFR calc non Af Amer: >60 mL/min (ref >60)
Glucose, Bld: 115 mg/dL — ABNORMAL HIGH (ref 70–99)
Potassium: 4.5 mmol/L (ref 3.5–5.1)
Sodium: 138 mmol/L (ref 135–145)
Total Bilirubin: 0.4 mg/dL (ref 0.3–1.2)
Total Protein: 7.7 g/dL (ref 6.5–8.1)

## 2019-07-23 LAB — CYTOLOGY - NON PAP

## 2019-07-23 LAB — AMYLASE, PLEURAL OR PERITONEAL FLUID: Amylase, Fluid: 52 U/L

## 2019-07-23 LAB — CBC
HCT: 42 % (ref 39.0–52.0)
Hemoglobin: 13.9 g/dL (ref 13.0–17.0)
MCH: 28.7 pg (ref 26.0–34.0)
MCHC: 33.1 g/dL (ref 30.0–36.0)
MCV: 86.6 fL (ref 80.0–100.0)
Platelets: 499 10*3/uL — ABNORMAL HIGH (ref 150–400)
RBC: 4.85 MIL/uL (ref 4.22–5.81)
RDW: 13.7 % (ref 11.5–15.5)
WBC: 12.6 10*3/uL — ABNORMAL HIGH (ref 4.0–10.5)
nRBC: 0 % (ref 0.0–0.2)

## 2019-07-23 LAB — HEPARIN LEVEL (UNFRACTIONATED): Heparin Unfractionated: 0.33 IU/mL (ref 0.30–0.70)

## 2019-07-23 LAB — MAGNESIUM: Magnesium: 2.1 mg/dL (ref 1.7–2.4)

## 2019-07-23 MED ORDER — APIXABAN 5 MG PO TABS
10.0000 mg | ORAL_TABLET | Freq: Two times a day (BID) | ORAL | Status: DC
  Administered 2019-07-23: 10 mg via ORAL
  Filled 2019-07-23: qty 2

## 2019-07-23 MED ORDER — AMOXICILLIN-POT CLAVULANATE 875-125 MG PO TABS: 1.0000 | ORAL_TABLET | Freq: Two times a day (BID) | ORAL | 0 refills | Status: DC

## 2019-07-23 MED ORDER — HYDROCODONE-ACETAMINOPHEN 5-325 MG PO TABS: 1.0000 | ORAL_TABLET | Freq: Four times a day (QID) | ORAL | 0 refills | Status: DC | PRN

## 2019-07-23 MED ORDER — NICOTINE 14 MG/24HR TD PT24: 14.0000 mg | MEDICATED_PATCH | Freq: Every day | TRANSDERMAL | Status: DC

## 2019-07-23 MED ORDER — GUAIFENESIN-CODEINE 100-10 MG/5ML PO SOLN: 5.0000 mL | ORAL | 0 refills | Status: DC | PRN

## 2019-07-23 MED ORDER — APIXABAN 5 MG PO TABS: 5.0000 mg | ORAL_TABLET | Freq: Two times a day (BID) | ORAL | Status: DC

## 2019-07-23 MED ORDER — HEPARIN (PORCINE) 25000 UT/250ML-% IV SOLN
1550.0000 [IU]/h | INTRAVENOUS | Status: AC
  Filled 2019-07-23: qty 250

## 2019-07-23 MED ORDER — HEPARIN (PORCINE) 25000 UT/250ML-% IV SOLN
1550.0000 [IU]/h | INTRAVENOUS | Status: DC
  Filled 2019-07-23: qty 250

## 2019-07-23 MED ORDER — MENTHOL 3 MG MT LOZG
1.0000 | LOZENGE | OROMUCOSAL | Status: DC | PRN
  Administered 2019-07-23: 3 mg via ORAL
  Filled 2019-07-23: qty 9

## 2019-07-23 MED ORDER — APIXABAN (ELIQUIS) VTE STARTER PACK (10MG AND 5MG): ORAL_TABLET | ORAL | 0 refills | Status: DC

## 2019-07-23 MED ORDER — NICOTINE 14 MG/24HR TD PT24: 14.0000 mg | MEDICATED_PATCH | Freq: Every day | TRANSDERMAL | 0 refills | Status: DC

## 2019-07-23 MED FILL — GUAIATUSSIN AC LIQUID: 100-10 | 6 days supply | Qty: 120 | Fill #0

## 2019-07-23 MED FILL — NICOTINE 14 MG/24HR PATCH: 14 | 28 days supply | Qty: 28 | Fill #0

## 2019-07-23 NOTE — Addendum Note (Signed)
Addended by: Wyvonne Lenz on: 07/23/2019 04:18 PM   Modules accepted: Orders

## 2019-07-23 NOTE — Telephone Encounter (Signed)
So appt will show up on pt's AVS when pt is discharged, I have scheduled pt an appt with Dr. Tonia Brooms Tuesday April 20 at 11am.   Dr. Tonia Brooms, in regards to the repeat imaging please advise if you are talking about pt having a cxr performed the day pt comes in for the appt?

## 2019-07-23 NOTE — Progress Notes (Signed)
ANTICOAGULATION CONSULT NOTE  Pharmacy Consult for Heparin Indication: pulmonary embolus  No Known Allergies  Patient Measurements: Height: 6\' 1"  (185.4 cm) Weight: 128 lb 4.9 oz (58.2 kg) IBW/kg (Calculated) : 79.9 Heparin Dosing Weight: n/a. Use TBW of 58 kg  Vital Signs: Temp: 98 F (36.7 C) (03/19 0205) Temp Source: Oral (03/18 2216) BP: 131/86 (03/19 0205) Pulse Rate: 79 (03/19 0205)  Labs: Recent Labs    07/20/19 1623 07/20/19 1623 07/21/19 0553 07/21/19 1539 07/22/19 0053 07/22/19 0759 07/23/19 0524  HGB 15.7   < > 13.2  --  12.6*  --  13.9  HCT 47.4   < > 39.7  --  38.2*  --  42.0  PLT 506*   < > 427*  --  446*  --  499*  APTT 34  --   --   --   --   --   --   LABPROT 13.9  --   --   --   --   --   --   INR 1.1  --   --   --   --   --   --   HEPARINUNFRC  --   --  <0.10*   < > 0.63 0.62 0.33  CREATININE 0.84   < > 0.78  --  0.70  --  0.79   < > = values in this interval not displayed.    Estimated Creatinine Clearance: 78.8 mL/min (by C-G formula based on SCr of 0.79 mg/dL).   Medical History: Past Medical History:  Diagnosis Date  . Bacteremia    2016 or so with hospitalization  . Inguinal hernia recurrent unilateral    herniation of mesentary and loops of bowel to right scrotal sac, 4.5 cm defect     Medications:  No anticoagulants PTA  Assessment: 49 yoM presenting with back pain radiating down leg, CT angio + for PE. LE venous dopplers negative for DVT. Pt on heparin drip for anticoagulation, pharmacy dosing.  Today, 07/23/19:  HL = 0.33 units/mL, remains therapeutic on heparin infusion of 1500 units/hr but has trended down to lower end of therapeutic range  CBC: Hgb WNL; Pltc elevated at 499K  Confirmed with RN that heparin infusing at correct rate, no signs of bleeding.   Goal of Therapy:  Heparin level 0.3-0.7 units/ml Monitor platelets by anticoagulation protocol: Yes   Plan:   Slightly increase heparin infusion to 1550  units/hr  Check HL in 6 hours  Daily CBC, heparin level   Monitor for s/sx of bleeding  Follow along for potential transition to PO or subQ anticoagulation  07/25/19, PharmD 07/23/19 7:37 AM

## 2019-07-23 NOTE — Progress Notes (Signed)
  Pt is being discharged home today. Discharge instructions including medications and follow up appointments given. Pt had no further questions at this time. 

## 2019-07-23 NOTE — Telephone Encounter (Signed)
Order placed for the cxr prior to pt seeing [name] and appt note also updated. Nothing further needed.

## 2019-07-23 NOTE — Telephone Encounter (Signed)
Patient is still admitted, will hold until discharge.

## 2019-07-23 NOTE — Progress Notes (Signed)
ANTICOAGULATION CONSULT NOTE  Pharmacy Consult for Heparin --> apixaban Indication: pulmonary embolus  No Known Allergies  Patient Measurements: Height: 6\' 1"  (185.4 cm) Weight: 128 lb 4.9 oz (58.2 kg) IBW/kg (Calculated) : 79.9 Heparin Dosing Weight: n/a. Use TBW of 58 kg  Vital Signs: Temp: 98 F (36.7 C) (03/19 0205) BP: 131/86 (03/19 0205) Pulse Rate: 79 (03/19 0205)  Labs: Recent Labs    07/20/19 1623 07/20/19 1623 07/21/19 0553 07/21/19 1539 07/22/19 0053 07/22/19 0759 07/23/19 0524  HGB 15.7   < > 13.2  --  12.6*  --  13.9  HCT 47.4   < > 39.7  --  38.2*  --  42.0  PLT 506*   < > 427*  --  446*  --  499*  APTT 34  --   --   --   --   --   --   LABPROT 13.9  --   --   --   --   --   --   INR 1.1  --   --   --   --   --   --   HEPARINUNFRC  --   --  <0.10*   < > 0.63 0.62 0.33  CREATININE 0.84   < > 0.78  --  0.70  --  0.79   < > = values in this interval not displayed.    Estimated Creatinine Clearance: 78.8 mL/min (by C-G formula based on SCr of 0.79 mg/dL).   Medical History: Past Medical History:  Diagnosis Date  . Bacteremia    2016 or so with hospitalization  . Inguinal hernia recurrent unilateral    herniation of mesentary and loops of bowel to right scrotal sac, 4.5 cm defect     Medications:  No anticoagulants PTA  Assessment: 44 yoM presenting with back pain radiating down leg, CT angio + for PE. LE venous dopplers negative for DVT. Pt on heparin drip for anticoagulation, pharmacy dosing.  Today, 07/23/19:  CBC: Hgb WNL; Pltc elevated at 499K  SCr 0.8, CrCl 79 mL/min  Pharmacy consulted to transition anticoagulation from heparin drip to apixaban.   Plan:   Discontinue heparin at the time of apixaban initiation  Apixaban 10 mg PO BID x7 days followed by 5 mg PO BID  Manufacturer coupon and medication counseling provided on 3/19  CBC at least q72 hrs while inpatient  4/19, PharmD 07/23/19 12:50 PM

## 2019-07-23 NOTE — Discharge Summary (Signed)
Physician Discharge Summary  Marcus Pace WUJ:[id number] DOB: [birth year] DOA: 07/20/2019  PCP: Marcus Saupe, MD  Admit date: 07/20/2019 Discharge date: 07/23/2019  Admitted From: Home  Discharge disposition: Home  Recommendations for Outpatient Follow-Up:   . Follow up with your primary care provider in one week.  . Check CBC, BMP in the next visit. . Follow-up with Dr. Kaylyn Lim, pulmonary team as scheduled by the pulmonary team. . Patient will need to continue Augmentin for the next 4 weeks.  Patient will need CT scan in the next visit/ might need bronchoscopic evaluation at that time, to be decided by pulmonary team..   Discharge Diagnosis:   Principal Problem:   Lesion of right lung Active Problems:   Inguinal hernia, right   Pulmonary abscess (HCC)   Emphysema of lung (HCC)   Bronchiectasis (HCC)   Pneumonia due to organism   Sigmoid thickening   Pulmonary emboli (HCC)   RLL pneumonia   Discharge Condition: Improved.  Diet recommendation: Low sodium, heart healthy.    Wound care: None.  Code status: Full.   History of Present Illness:   Marcus Pace a 64 y.o.malewith medical history significant ofinguinal hernia right, h/o bacteremia in the past, who was recently seen in the ED for  left leg pain. For the past week or so he has had right chest/flank pain which was worse with movement, deep breathing, cough. He reported chills and sweats, hemoptysis over the weekend, describing small clots of blood. He is a long term smoker, 20 packyears, but has never been diagnosed with chronic lung disease.  He denied any weight loss, having always been small stature. He does admit to a change in bowel habit with increasing constipation and small caliber stools but denies hematochezia. He does drink 60-80 ounces of beer daily and has for a long time. He presented to [facility] for evaluation.  In the ED, BP was stable, he was noted to be tachycardic and tachypneic on  exam and had a low grade fever at 99.5. Lab revealed leukocytosis at 15.8 with normal diff. Initial lactic acid was 2.1. Code sepsis initiated and he was given IV antibiotics and IVF. CXR revealed 8x4 cm lobulated lesion RLL. CT chest/CTA revealed emphysema, bronchiectasis, 6 cm spiculated mass RLL at surface of the lung with a small loculated pleural effusion concerning for abscess vs malignancy, non-occlusive infrahilar pulmonary embolism. CT abdomen with 4 lesions hemangioma vs mets, non-distended sigmoid colon. Patient referred to [facility] for admission to treat possible pulmonary infection and to workup lung lesion, liver lesions, sigmoid colon abnormality.   Hospital Course:   Following conditions were addressed during hospitalization as listed below,  Right infrahilar spiculated lung mass , loculated effusion.  As per CT scan.  Patient was thought to have complicated pneumonia with lung abscess and loculated pleural effusion after discussing with pulmonary.  There is possibility that there could be underlying mass as well.   Patient underwent IR guided pleural tapping on 07/23/2019.  Gram stain showed no organisms.  Given less than 12 hours.  AFB pending.  Cytology pending.  Patient received IV Zosyn and Zithromax while in the hospital.  Pulmonary recommended Augmentin for next 4 weeks on discharge and outpatient follow-up with pulmonary with potential CT scan repeat in her bronchoscopy.  CT scan of the abdomen pelvis showed heterogeneous liver but MRI did not show any discrete mass.  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  Discussed with IR  about it. Sputum showed gram-positive cocci in chains and rods.  Chronic cigarette smoker.  Smokes half pack a day. Continue nicotine patch on discharge.  Patient was extensively counseled about it.  8 mm sclerotic lesion in the right S1.  Uncertain significance at this time.  Unable to biopsy/low yield even if able  to  Nonocclusive peripheral pulmonary emboli on the right side.   Patient received heparin drip in hospitalization.  Lower extremity ultrasound showed no evidence of DVT.  Patient was changed to Eliquis on discharge.   Liver lesions - mets vs hemangioma as per CT scan.  MRI did not show any discrete masses  No evidence of of cirrhosis.  Mild proximal descending colitis/nondistended colon.  Change in stool caliber.  Possibility that the patient might need a GI evaluation at some point.  Alcohol use disorder.  No withdrawal symptoms. Drinks beer every day.    He did not have any withdrawal symptoms during hospitalization.     Disposition.  At this time, patient is stable for disposition home.  Patient will have to follow-up with primary care provider and pulmonary as outpatient.  Follow-up with pulmonary, will be arranged by the clinic.  Medical Consultants:    PCCM  IR  Procedures:    None Subjective:   Today, patient little better.  He still has mild chest pain on taking deep breath and coughing.  No dyspnea  Discharge Exam:   Vitals:   07/22/19 2216 07/23/19 0205  BP: 131/75 131/86  Pulse: 82 79  Resp: (!) 24 18  Temp: 99 F (37.2 C) 98 F (36.7 C)  SpO2: 94% 97%   Vitals:   07/22/19 1254 07/22/19 1619 07/22/19 2216 07/23/19 0205  BP: 117/65 115/67 131/75 131/86  Pulse: 93  82 79  Resp: 18  (!) 24 18  Temp: 98.4 F (36.9 C)  99 F (37.2 C) 98 F (36.7 C)  TempSrc: Oral  Oral   SpO2: 96%  94% 97%  Weight:      Height:       General: Alert awake, not in obvious distress, thinly built HENT: pupils equally reacting to light,  No scleral pallor or icterus noted. Oral mucosa is moist.  Chest: Coarse breath sounds noted bilaterally CVS: S1 &S2 heard. No murmur.  Regular rate and rhythm. Abdomen: Soft, nontender, nondistended.  Bowel sounds are heard.   Extremities: No cyanosis, clubbing or edema.  Peripheral pulses are palpable. Psych: Alert, awake and  oriented, normal mood CNS:  No cranial nerve deficits.  Power equal in all extremities.   Skin: Warm and dry.  No rashes noted.  The results of significant diagnostics from this hospitalization (including imaging, microbiology, ancillary and laboratory) are listed below for reference.     Diagnostic Studies:   CT Chest W Contrast  Addendum Date: 07/20/2019   ADDENDUM REPORT: 07/20/2019 19:07 ADDENDUM: I discussed critical value/emergent results and the need for additional follow-up imaging by telephone at the time of interpretation on 07/20/2019 at 7:03 pm with the patient's provider Dr. Landis GandyWilder, who verbally acknowledged these results. Electronically Signed   By: Laurence Ferrariachel  [place]   On: 07/20/2019 19:07   Result Date: 07/20/2019 CLINICAL DATA:  Left lower back pain radiating down the left leg for the past 2 weeks. Possible cavitary pulmonary lesion. EXAM: CT CHEST, ABDOMEN, AND PELVIS WITH CONTRAST TECHNIQUE: Multidetector CT imaging of the chest, abdomen and pelvis was performed following the standard protocol during bolus administration of intravenous contrast. Automatic exposure control  utilized. CONTRAST:  OMNIPAQUE IOHEXOL 300 MG/ML  SOLN COMPARISON:  None. Chest radiography acquired earlier on the same date. FINDINGS: CT CHEST FINDINGS Cardiovascular: Normal heart size. Four-vessel coronary calcification moderate along the LAD ostia. No thoracic aortic aneurysm. Patent central and lobar pulmonary arteries. Punctate central contrast filling defects in the right infrahilar segmental branches. Mediastinum/Nodes: Borderline enlarged right hilar noncalcified lymph node, 11 x 18 mm. Nonspecific 11 mm short axis diameter subcarinal and subcentimeter mediastinal lymph nodes. Lungs/Pleura: Moderate centrilobular emphysema with bilateral bronchiectasis most pronounced in the lung bases. Bulky aspirated material in the right mainstem bronchus. Right infrahilar spiculated 6 cm consolidation with multiple  internal loculated fluid components and traversing air bronchograms and peripheral interlobular septal thickening corresponding to the prior radiographic abnormality, possibly infectious and/or malignant or ischemic. No associated cavitary lesion. Partially loculated small right pleural effusion. No left pleural effusion. Musculoskeletal: Moderate skeletal degenerative change. No cortical erosion or suspicious osseous lesion of the thoracic spine or ribcage or sternum., Partially loculated along the right lateral and posterior chest wall. CT ABDOMEN PELVIS FINDINGS Hepatobiliary: An 11 mm hypervascular lesion in the right hepatic dome on axial series 2, image 52 and coronal series 7, image 72. A second hypervascular lesion in the posterior right hemi liver measuring 9 x 7 mm on coronal image 97 and axial image 67. A third hypervascular lesion measuring 15 x 11 mm at the junction of the right and left hemi liver on axial image 60 and coronal image 45. A fourth hypervascular lesion in the caudate lobe measuring 11 x 8 mm on axial image 68 and coronal image 57. Borderline hepatomegaly with normal smooth hepatic contours. Pancreas: No acute pancreatitis or main ductal dilatation. Spleen: Normal. Adrenals/Urinary Tract: Normal adrenal glands. No apparent abnormality of the urinary bladder or bilateral ureters. Subcentimeter left renal cortical cysts, and otherwise normal bilateral kidneys. Stomach/Bowel: Mild nonspecific colitis of the proximal descending colon along the left flank without pericolonic abscess or bowel perforation. Several nondistended versus non distensible short segments in the sigmoid colon. No bowel obstruction. Probably decompressed appendix, sagittal image 73. Decompressed stomach and small bowel without an apparent mucosal abnormality or obstruction. Right indirect inguinal herniation of elongated mesentery, mesenteric vasculature, and small bowel loops through a wide 4.5 cm defect with extension  into the scrotal sac, but without apparent strangulation or incarceration. Vascular/Lymphatic: Areas of noncontrast opacification in the left renal vein and inferior vena cava extending into the right cardiac atria which is probably secondary to contrast bolus timing given the unenhanced hepatic and more distal ileocaval veins. Infundibulum at the superior mesenteric artery ostium. Abdominal aorta minimal calcified atherosclerosis without aneurysmal dilatation. Nonspecific small retroperitoneal lymph nodes. Reproductive: Prostatomegaly. Other: Mild dermal thickening overlying the sacrum without discrete ulceration or osseous erosion or abscess. Musculoskeletal: Moderate skeletal degenerative change and diffuse bone demineralization. A couple punctate sclerotic lesion in the hips and bony pelvis. An 8 mm sclerotic lesion in the right S1 segment similar to the July 08, 2019 spine radiography, but new since the May 28, 2004 spine radiography, concerning for an osteoblastic lesion. IMPRESSION: A mild uncomplicated colitis of the proximal descending colon. Short segments of nondistended versus non-distensible sigmoid colon. Correlation with colonoscopy recommended to exclude an underlying colonic malignancy. Right infrahilar spiculated lung mass with multiple internal cystic lesions, but no associated cavitation at this time. An infectious pulmonary abscess, malignant, or less likely ischemic etiologies are differential considerations. Small right partially loculated pleural effusion, possibly benign or malignant. Nonspecific mildly  enlarged right hilar lymph node. Unenhanced CT chest recommended in 6-8 weeks to document any interval response to therapy. Tissue sampling could be considered. Nonocclusive peripheral pulmonary emboli in the right infrahilar region. Patent central pulmonary arteries without right heart strain. Several small hypervascular hepatic lesions, incompletely characterized on single-phase  enhanced imaging measuring up to 15 mm diameter, possibly benign hemangiomas but a malignant etiology is not excluded. Borderline hepatomegaly without cirrhosis. Further characterization with MR abdomen without and with intravenous contrast liver protocol recommended. 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 Four-vessel coronary calcification. Large right indirect inguinal herniation of mesentery and small bowel loops through a wide neck without strangulation or incarceration. Prostatomegaly. Centrilobular emphysema with bilateral bronchiectasis, aspiration, and aortic calcified atherosclerosis. Electronically Signed: By: Laurence Ferrari On: 07/20/2019 19:00   CT ABDOMEN PELVIS W CONTRAST  Addendum Date: 07/20/2019   ADDENDUM REPORT: 07/20/2019 19:07 ADDENDUM: I discussed critical value/emergent results and the need for additional follow-up imaging by telephone at the time of interpretation on 07/20/2019 at 7:03 pm with the patient's provider Dr. Landis Gandy, who verbally acknowledged these results. Electronically Signed   By: Laurence Ferrari   On: 07/20/2019 19:07   Result Date: 07/20/2019 CLINICAL DATA:  Left lower back pain radiating down the left leg for the past 2 weeks. Possible cavitary pulmonary lesion. EXAM: CT CHEST, ABDOMEN, AND PELVIS WITH CONTRAST TECHNIQUE: Multidetector CT imaging of the chest, abdomen and pelvis was performed following the standard protocol during bolus administration of intravenous contrast. Automatic exposure control utilized. CONTRAST:  OMNIPAQUE IOHEXOL 300 MG/ML  SOLN COMPARISON:  None. Chest radiography acquired earlier on the same date. FINDINGS: CT CHEST FINDINGS Cardiovascular: Normal heart size. Four-vessel coronary calcification moderate along the LAD ostia. No thoracic aortic aneurysm. Patent central and lobar pulmonary arteries.  Punctate central contrast filling defects in the right infrahilar segmental branches. Mediastinum/Nodes: Borderline enlarged right hilar noncalcified lymph node, 11 x 18 mm. Nonspecific 11 mm short axis diameter subcarinal and subcentimeter mediastinal lymph nodes. Lungs/Pleura: Moderate centrilobular emphysema with bilateral bronchiectasis most pronounced in the lung bases. Bulky aspirated material in the right mainstem bronchus. Right infrahilar spiculated 6 cm consolidation with multiple internal loculated fluid components and traversing air bronchograms and peripheral interlobular septal thickening corresponding to the prior radiographic abnormality, possibly infectious and/or malignant or ischemic. No associated cavitary lesion. Partially loculated small right pleural effusion. No left pleural effusion. Musculoskeletal: Moderate skeletal degenerative change. No cortical erosion or suspicious osseous lesion of the thoracic spine or ribcage or sternum., Partially loculated along the right lateral and posterior chest wall. CT ABDOMEN PELVIS FINDINGS Hepatobiliary: An 11 mm hypervascular lesion in the right hepatic dome on axial series 2, image 52 and coronal series 7, image 72. A second hypervascular lesion in the posterior right hemi liver measuring 9 x 7 mm on coronal image 97 and axial image 67. A third hypervascular lesion measuring 15 x 11 mm at the junction of the right and left hemi liver on axial image 60 and coronal image 45. A fourth hypervascular lesion in the caudate lobe measuring 11 x 8 mm on axial image 68 and coronal image 57. Borderline hepatomegaly with normal smooth hepatic contours. Pancreas: No acute pancreatitis or main ductal dilatation. Spleen: Normal. Adrenals/Urinary Tract: Normal adrenal glands. No apparent abnormality of the urinary bladder or bilateral ureters. Subcentimeter left renal cortical cysts, and otherwise  normal bilateral kidneys. Stomach/Bowel: Mild nonspecific colitis of  the proximal descending colon along the left flank without pericolonic abscess or bowel perforation. Several nondistended versus non distensible short segments in the sigmoid colon. No bowel obstruction. Probably decompressed appendix, sagittal image 73. Decompressed stomach and small bowel without an apparent mucosal abnormality or obstruction. Right indirect inguinal herniation of elongated mesentery, mesenteric vasculature, and small bowel loops through a wide 4.5 cm defect with extension into the scrotal sac, but without apparent strangulation or incarceration. Vascular/Lymphatic: Areas of noncontrast opacification in the left renal vein and inferior vena cava extending into the right cardiac atria which is probably secondary to contrast bolus timing given the unenhanced hepatic and more distal ileocaval veins. Infundibulum at the superior mesenteric artery ostium. Abdominal aorta minimal calcified atherosclerosis without aneurysmal dilatation. Nonspecific small retroperitoneal lymph nodes. Reproductive: Prostatomegaly. Other: Mild dermal thickening overlying the sacrum without discrete ulceration or osseous erosion or abscess. Musculoskeletal: Moderate skeletal degenerative change and diffuse bone demineralization. A couple punctate sclerotic lesion in the hips and bony pelvis. An 8 mm sclerotic lesion in the right S1 segment similar to the July 08, 2019 spine radiography, but new since the May 28, 2004 spine radiography, concerning for an osteoblastic lesion. IMPRESSION: A mild uncomplicated colitis of the proximal descending colon. Short segments of nondistended versus non-distensible sigmoid colon. Correlation with colonoscopy recommended to exclude an underlying colonic malignancy. Right infrahilar spiculated lung mass with multiple internal cystic lesions, but no associated cavitation at this time. An infectious pulmonary abscess, malignant, or less likely ischemic etiologies are differential  considerations. Small right partially loculated pleural effusion, possibly benign or malignant. Nonspecific mildly enlarged right hilar lymph node. Unenhanced CT chest recommended in 6-8 weeks to document any interval response to therapy. Tissue sampling could be considered. Nonocclusive peripheral pulmonary emboli in the right infrahilar region. Patent central pulmonary arteries without right heart strain. Several small hypervascular hepatic lesions, incompletely characterized on single-phase enhanced imaging measuring up to 15 mm diameter, possibly benign hemangiomas but a malignant etiology is not excluded. Borderline hepatomegaly without cirrhosis. Further characterization with MR abdomen without and with intravenous contrast liver protocol recommended. 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 Four-vessel coronary calcification. Large right indirect inguinal herniation of mesentery and small bowel loops through a wide neck without strangulation or incarceration. Prostatomegaly. Centrilobular emphysema with bilateral bronchiectasis, aspiration, and aortic calcified atherosclerosis. Electronically Signed: By: Laurence Ferrari On: 07/20/2019 19:00   MR LIVER W WO CONTRAST  Result Date: 07/21/2019 CLINICAL DATA:  Indeterminate liver lesions on CT EXAM: MRI ABDOMEN WITHOUT AND WITH CONTRAST TECHNIQUE: Multiplanar multisequence MR imaging of the abdomen was performed both before and after the administration of intravenous contrast. CONTRAST:  5mL GADAVIST GADOBUTROL 1 MMOL/ML IV SOLN COMPARISON:  07/20/2019 abdominal CT FINDINGS: Portions of exam are mildly motion degraded. Lower chest: Small right pleural effusion. Right lower lobe process was detailed on CT. Hepatobiliary: No cirrhosis. No T2 hyperintense lesions or areas of restricted diffusion. Arterial phase images  demonstrate multiple subcentimeter foci of hyperenhancement, including on series 19. Late arterial and early portal venous phase apparent hyperenhancement within the left hepatic lobe, including on 25/21, is favored to be due to perfusion anomaly (and possibly mild artifact). No convincing evidence of underlying lesion in this area. Normal gallbladder, without biliary ductal dilatation. Pancreas:  Normal, without mass or ductal dilatation. Spleen:  Normal in size, without focal abnormality. Adrenals/Urinary Tract: Normal adrenal glands. Interpolar left renal cyst. Normal right kidney. Stomach/Bowel: Normal stomach and small bowel loops. Large colonic stool burden. Vascular/Lymphatic: Aortic atherosclerosis. No abdominal adenopathy. Other:  No ascites. Musculoskeletal: Convex right lumbar spine curvature. IMPRESSION: 1. Motion degraded exam. 2. Areas of arterial phase hyperenhancement throughout the liver, without correlate on other pulse sequences. Favored to represent perfusion anomalies. Given motion limitations of the current exam, outpatient follow-up MRI at 3 months should be considered. 3. Small right pleural effusion and right lower lobe lung process, as on CT. 4.  Aortic Atherosclerosis (ICD10-I70.0). Electronically Signed   By: Jeronimo Greaves M.D.   On: 07/21/2019 10:08   DG Chest Port 1 View  Result Date: 07/20/2019 CLINICAL DATA:  Cough and weakness. EXAM: PORTABLE CHEST 1 VIEW COMPARISON:  August 02, 2015 FINDINGS: An 8.8 cm x 4.3 cm mildly lobulated opacity is seen overlying the lateral aspect of the mid to lower right lung. This area contains a 2.3 cm x 1.6 cm focal lucency. There is no evidence of a pleural effusion or pneumothorax. The heart size and mediastinal contours are within normal limits. The visualized skeletal structures are unremarkable. IMPRESSION: 8.8 cm x 4.3 cm mildly lobulated opacity overlying the lateral aspect of the mid to lower right lung. While this may represent a cavitary  pneumonia, a cavitary lung mass cannot be excluded. Correlation with chest CT is recommended. Electronically Signed   By: Aram Candela M.D.   On: 07/20/2019 16:32   VAS [place] LOWER EXTREMITY VENOUS (DVT)  Result Date: 07/21/2019  Lower Venous DVTStudy Indications: Pulmonary embolism.  Comparison Study: no prior Performing Technologist: Blanch Media RVS  Examination Guidelines: A complete evaluation includes B-mode imaging, spectral Doppler, color Doppler, and power Doppler as needed of all accessible portions of each vessel. Bilateral testing is considered an integral part of a complete examination. Limited examinations for reoccurring indications may be performed as noted. The reflux portion of the exam is performed with the patient in reverse Trendelenburg.  +---------+---------------+---------+-----------+----------+--------------+ RIGHT    CompressibilityPhasicitySpontaneityPropertiesThrombus Aging +---------+---------------+---------+-----------+----------+--------------+ CFV      Full           Yes      Yes                                 +---------+---------------+---------+-----------+----------+--------------+ SFJ      Full                                                        +---------+---------------+---------+-----------+----------+--------------+ FV Prox  Full                                                        +---------+---------------+---------+-----------+----------+--------------+ FV Mid   Full                                                        +---------+---------------+---------+-----------+----------+--------------+  FV DistalFull                                                        +---------+---------------+---------+-----------+----------+--------------+ PFV      Full                                                        +---------+---------------+---------+-----------+----------+--------------+ POP      Full           Yes       Yes                                 +---------+---------------+---------+-----------+----------+--------------+ PTV      Full                                                        +---------+---------------+---------+-----------+----------+--------------+ PERO     Full                                                        +---------+---------------+---------+-----------+----------+--------------+   +---------+---------------+---------+-----------+----------+--------------+ LEFT     CompressibilityPhasicitySpontaneityPropertiesThrombus Aging +---------+---------------+---------+-----------+----------+--------------+ CFV      Full           Yes      Yes                                 +---------+---------------+---------+-----------+----------+--------------+ SFJ      Full                                                        +---------+---------------+---------+-----------+----------+--------------+ FV Prox  Full                                                        +---------+---------------+---------+-----------+----------+--------------+ FV Mid   Full                                                        +---------+---------------+---------+-----------+----------+--------------+ FV DistalFull                                                        +---------+---------------+---------+-----------+----------+--------------+  PFV      Full                                                        +---------+---------------+---------+-----------+----------+--------------+ POP      Full           Yes      Yes                                 +---------+---------------+---------+-----------+----------+--------------+ PTV      Full                                                        +---------+---------------+---------+-----------+----------+--------------+ PERO     Full                                                         +---------+---------------+---------+-----------+----------+--------------+     Summary: BILATERAL: - No evidence of deep vein thrombosis seen in the lower extremities, bilaterally.   *See table(s) above for measurements and observations. Electronically signed by Curt Jews MD on 07/21/2019 at 3:51:51 PM.    Final      Labs:   Basic Metabolic Panel: Recent Labs  Lab 07/20/19 1623 07/20/19 1623 07/21/19 0553 07/21/19 0553 07/22/19 0053 07/23/19 0524  NA 135  --  136  --  136 138  K 3.7   < > 4.1   < > 3.7 4.5  CL 96*  --  104  --  103 102  CO2 27  --  24  --  25 28  GLUCOSE 109*  --  116*  --  112* 115*  BUN 9  --  11  --  9 10  CREATININE 0.84  --  0.78  --  0.70 0.79  CALCIUM 9.0  --  8.4*  --  8.2* 8.9  MG  --   --   --   --  1.9 2.1   < > = values in this interval not displayed.   GFR Estimated Creatinine Clearance: 78.8 mL/min (by C-G formula based on SCr of 0.79 mg/dL). Liver Function Tests: Recent Labs  Lab 07/20/19 1623 07/23/19 0524  AST 38 89*  ALT 67* 117*  ALKPHOS 106 95  BILITOT 1.0 0.4  PROT 8.5* 7.7  ALBUMIN 3.2* 2.5*   Recent Labs  Lab 07/20/19 1623  LIPASE 15   No results for input(s): AMMONIA in the last 168 hours. Coagulation profile Recent Labs  Lab 07/20/19 1623  INR 1.1    CBC: Recent Labs  Lab 07/20/19 1623 07/21/19 0553 07/22/19 0053 07/23/19 0524  WBC 15.8* 14.2* 13.4* 12.6*  NEUTROABS 12.2*  --   --   --   HGB 15.7 13.2 12.6* 13.9  HCT 47.4 39.7 38.2* 42.0  MCV 86.0 86.5 86.4 86.6  PLT 506* 427* 446* 499*   Cardiac Enzymes: No results for input(s): CKTOTAL, CKMB, CKMBINDEX, TROPONINI in the last  168 hours. BNP: Invalid input(s): POCBNP CBG: No results for input(s): GLUCAP in the last 168 hours. D-Dimer No results for input(s): DDIMER in the last 72 hours. Hgb A1c No results for input(s): HGBA1C in the last 72 hours. Lipid Profile No results for input(s): CHOL, HDL, LDLCALC, TRIG, CHOLHDL, LDLDIRECT in the last 72  hours. Thyroid function studies Recent Labs    07/20/19 1623  TSH 0.993   Anemia work up No results for input(s): VITAMINB12, FOLATE, FERRITIN, TIBC, IRON, RETICCTPCT in the last 72 hours. Microbiology Recent Results (from the past 240 hour(s))  Urine Culture     Status: Abnormal   Collection Time: 07/20/19  3:55 PM   Specimen: Urine, Random  Result Value Ref Range Status   Specimen Description   Final    URINE, RANDOM Performed at [facility], [address] [place], [place] [address]    Special Requests   Final    NONE Performed at [facility], [address], [place], [place] [address]    Culture MULTIPLE SPECIES PRESENT, SUGGEST RECOLLECTION (A)  Final   Report Status 07/22/2019 FINAL  Final  SARS CORONAVIRUS 2 (TAT 6-24 HRS) Nasopharyngeal Nasopharyngeal Swab     Status: None   Collection Time: 07/20/19  4:23 PM   Specimen: Nasopharyngeal Swab  Result Value Ref Range Status   SARS Coronavirus 2 NEGATIVE NEGATIVE Final    Comment: (NOTE) SARS-CoV-2 target nucleic acids are NOT DETECTED. The SARS-CoV-2 RNA is generally detectable in upper and lower respiratory specimens during the acute phase of infection. Negative results do not preclude SARS-CoV-2 infection, do not rule out co-infections with other pathogens, and should not be used as the sole basis for treatment or other patient management decisions. Negative results must be combined with clinical observations, patient history, and epidemiological information. The expected result is Negative. Fact Sheet for Patients: HairSlick.no Fact Sheet for Healthcare Providers: quierodirigir.com This test is not yet approved or cleared by the [place] FDA and  has been authorized for detection and/or diagnosis of SARS-CoV-2 by FDA under an Emergency Use Authorization (EUA). This EUA will remain  in effect (meaning this test can  be used) for the duration of the COVID-19 declaration under Section 56 4(b)(1) of the Act, 21 U.S.C. section 360bbb-3(b)(1), unless the authorization is terminated or revoked sooner. Performed at [facility], [address], [place], [place] [address]   Culture, blood (single)     Status: None (Preliminary result)   Collection Time: 07/20/19  4:23 PM   Specimen: BLOOD LEFT FOREARM  Result Value Ref Range Status   Specimen Description   Final    BLOOD LEFT FOREARM Performed at [facility], [address], [place], [place] [address]    Special Requests   Final    BOTTLES DRAWN AEROBIC ONLY Blood Culture results may not be optimal due to an inadequate volume of blood received in culture bottles Performed at [facility], [address] [place], [place] [address]    Culture   Final    NO GROWTH 3 DAYS Performed at [facility], [address] [place], [place] [address]    Report Status PENDING  Incomplete  Culture, blood (single)     Status: None (Preliminary result)   Collection Time: 07/21/19  5:53 AM   Specimen: BLOOD  Result Value Ref Range Status   Specimen Description   Final    BLOOD RIGHT ANTECUBITAL Performed at  [facility], [address] [place], [place] [address]    Special Requests   Final    BOTTLES DRAWN AEROBIC ONLY Blood Culture adequate volume Performed at [facility], [address] [place], [place] [address]    Culture   Final    NO GROWTH 2 DAYS Performed at [facility], [address], [place], [place] [address]    Report Status PENDING  Incomplete  MRSA PCR Screening     Status: None   Collection Time: 07/21/19 12:32 PM   Specimen: Nasal Mucosa; Nasopharyngeal  Result Value Ref Range Status   MRSA by PCR NEGATIVE NEGATIVE Final    Comment:        The GeneXpert MRSA Assay (FDA approved for NASAL specimens only), is one component of a comprehensive  MRSA colonization surveillance program. It is not intended to diagnose MRSA infection nor to guide or monitor treatment for MRSA infections. Performed at [facility], [address], [place], [place] [address]   Expectorated sputum assessment w rflx to resp cult     Status: None   Collection Time: 07/21/19  5:30 PM   Specimen: Sputum  Result Value Ref Range Status   Specimen Description SPU  Final   Special Requests NONE  Final   Sputum evaluation   Final    THIS SPECIMEN IS ACCEPTABLE FOR SPUTUM CULTURE Performed at [facility], [address], [place], [place] [address]    Report Status 07/21/2019 FINAL  Final  Culture, respiratory     Status: None (Preliminary result)   Collection Time: 07/21/19  5:30 PM   Specimen: Sputum  Result Value Ref Range Status   Specimen Description   Final    SPU Performed at [facility], [address] [place], [place] [address]    Special Requests   Final    NONE Reflexed from [id number] Performed at [facility], [address], [place], [place] [address]    Gram Stain   Final    RARE WBC PRESENT, PREDOMINANTLY PMN FEW GRAM POSITIVE COCCI IN PAIRS IN CHAINS FEW GRAM POSITIVE RODS    Culture   Final    CULTURE REINCUBATED FOR BETTER GROWTH Performed at [facility], [address], [place], [place] [address]    Report Status PENDING  Incomplete  Culture, body fluid-bottle     Status: None (Preliminary result)   Collection Time: 07/22/19  4:51 PM   Specimen: Pleura  Result Value Ref Range Status   Specimen Description PLEURAL  Final   Special Requests NONE  Final   Culture   Final    NO GROWTH < 12 HOURS Performed at [facility], [address], [place], [place] [address]    Report Status PENDING  Incomplete  Gram stain     Status: None   Collection Time: 07/22/19  4:51 PM   Specimen: Pleura  Result Value Ref Range Status   Specimen  Description PLEURAL  Final   Special Requests NONE  Final   Gram Stain   Final    WBC PRESENT,BOTH PMN AND MONONUCLEAR NO ORGANISMS SEEN CYTOSPIN SMEAR Performed at [facility], [address], [place], [place] [address]    Report Status 07/23/2019 FINAL  Final     Discharge Instructions:   Discharge Instructions    Diet - low sodium heart healthy   Complete by: As directed  Discharge instructions   Complete by: As directed    Please follow-up with your primary care physician in 1 week.  Follow-up with [name], pulmonary clinic as scheduled by the clinic.  You will need to continue taking antibiotics for 4 weeks.  No smoking or alcohol.  Do not overexert.  If symptoms worsen, please seek medical attention.  Continue to take your blood thinners.  You will have to get a refill from your primary care physician for blood thinner on the next visit.  Do not take Mobic or Motrin while taking blood thinners, ok to take tylenol if needed.   Increase activity slowly   Complete by: As directed      Allergies as of 07/23/2019   No Known Allergies     Medication List    STOP taking these medications   ibuprofen 400 MG tablet Commonly known as: ADVIL   meloxicam 7.5 MG tablet Commonly known as: Mobic     TAKE these medications   amoxicillin-clavulanate 875-125 MG tablet Commonly known as: Augmentin Take 1 tablet by mouth 2 (two) times daily for 28 days.   Apixaban Starter Pack 5 MG Tbpk Commonly known as: ELIQUIS STARTER PACK Take as directed on package: start with two-5mg  tablets twice daily for 7 days. On day 8, switch to one-5mg  tablet twice daily.   feeding supplement (ENSURE ENLIVE) Liqd Take 237 mLs by mouth daily.   guaiFENesin-codeine 100-10 MG/5ML syrup Take 5 mLs by mouth every 4 (four) hours as needed for cough.   HYDROcodone-acetaminophen 5-325 MG tablet Commonly known as: NORCO/VICODIN Take 1 tablet by mouth every 6 (six) hours as needed for moderate  pain.   nicotine 14 mg/24hr patch Commonly known as: NICODERM CQ - dosed in mg/24 hours Place 1 patch (14 mg total) onto the skin daily. Start taking on: July 24, 2019   tiZANidine 4 MG tablet Commonly known as: Zanaflex Take 1 tablet (4 mg total) by mouth at bedtime as needed for muscle spasms.      Follow-up Information    Fulp, Cammie, MD. Schedule an appointment as soon as possible for a visit in 1 week(s).   Specialty: Family Medicine Why: for regular followup Contact information: [address] [place] [address] [phone]        [name], MD Follow up.   Specialty: Pulmonary Disease Why: office to call for appointment Contact information: [address] [place] [address] [phone]            Time coordinating discharge: 39 minutes  Signed:  Rayvin Abid  [facility] 07/23/2019, 1:18 PM

## 2019-07-23 NOTE — Telephone Encounter (Signed)
PCCM:  Please schedule follow up with [name] in clinic 3-4 weeks from now for repeat imaging and follow up of pulmonary abscess/right sided mass.   If [name] not available ok to schedule with APP for hospital follow up.   Thanks  Josephine Igo, DO [facility] 07/23/2019 11:25 AM

## 2019-07-23 NOTE — Discharge Instructions (Signed)
Information on my medicine - ELIQUIS (apixaban)  This medication education was reviewed with me or my healthcare representative as part of my discharge preparation.    Why was Eliquis prescribed for you? Eliquis was prescribed to treat blood clots that may have been found in the veins of your legs (deep vein thrombosis) or in your lungs (pulmonary embolism) and to reduce the risk of them occurring again.  What do You need to know about Eliquis ? The starting dose is 10 mg (two 5 mg tablets) taken TWICE daily for the FIRST SEVEN (7) DAYS, then on 07/30/19  the dose is reduced to ONE 5 mg tablet taken TWICE daily.  Eliquis may be taken with or without food.   Try to take the dose about the same time in the morning and in the evening. If you have difficulty swallowing the tablet whole please discuss with your pharmacist how to take the medication safely.  Take Eliquis exactly as prescribed and DO NOT stop taking Eliquis without talking to the doctor who prescribed the medication.  Stopping may increase your risk of developing a new blood clot.  Refill your prescription before you run out.  After discharge, you should have regular check-up appointments with your healthcare provider that is prescribing your Eliquis.    What do you do if you miss a dose? If a dose of ELIQUIS is not taken at the scheduled time, take it as soon as possible on the same day and twice-daily administration should be resumed. The dose should not be doubled to make up for a missed dose.  Important Safety Information A possible side effect of Eliquis is bleeding. You should call your healthcare provider right away if you experience any of the following: ? Bleeding from an injury or your nose that does not stop. ? Unusual colored urine (red or dark brown) or unusual colored stools (red or black). ? Unusual bruising for unknown reasons. ? A serious fall or if you hit your head (even if there is no bleeding).  Some  medicines may interact with Eliquis and might increase your risk of bleeding or clotting while on Eliquis. To help avoid this, consult your healthcare provider or pharmacist prior to using any new prescription or non-prescription medications, including herbals, vitamins, non-steroidal anti-inflammatory drugs (NSAIDs) and supplements.  This website has more information on Eliquis (apixaban): http://www.eliquis.com/eliquis/home  

## 2019-07-23 NOTE — TOC Transition Note (Signed)
Transition of Care [facility]) - CM/SW Discharge Note   Patient Details  Name: Marcus Pace MRN: [id number] Date of Birth: [birth year]  Transition of Care [facility] CM/SW Contact:  Amada Jupiter, LCSW Phone Number: 07/23/2019, 1:34 PM   Clinical Narrative:   Pt cleared for d/c today.  Alerted MD that he is followed at [facility] and receives his medications there as well.  No HH or DME needs.      Final next level of care: Home/Self Care Barriers to Discharge: Barriers Resolved   Patient Goals and CMS Choice Patient states their goals for this hospitalization and ongoing recovery are:: Pt hopeful for return home with his sister.   Choice offered to / list presented to : NA  Discharge Placement                       Discharge Plan and Services In-house Referral: Clinical Social Work              DME Arranged: N/A           HH Agency: NA        Social Determinants of Health (SDOH) Interventions     Readmission Risk Interventions Readmission Risk Prevention Plan 07/22/2019  Post Dischage Appt Complete  Medication Screening Complete  Transportation Screening Complete  Some recent data might be hidden

## 2019-07-23 NOTE — Progress Notes (Signed)
Pulmonary Progress Note:   S: 64 yo, 50 pack year history smoking, admitted for fever, productive cough, right chest pain, CT imaging with pulmonary abscess, associated consolidation, and small loculated effusion. Tapped by IR. Incomplete pleural fluid studies obtained for analysis. Also, contrasted CT reveals small subsegmental PE which appears in situ.   Patient with no issues overnight. States that he feels better now.   BP 131/86 (BP Location: Right Arm)   Pulse 79   Temp 98 F (36.7 C)   Resp 18   Ht 6\' 1"  (1.854 m)   Wt 58.2 kg   SpO2 97%   BMI 16.93 kg/m   General appearance: 64 y.o., male, NAD, conversant  Eyes: anicteric sclerae, moist conjunctivae; no lid-lag; PERRLA, tracking appropriately HENT: NCAT; oropharynx, MMM Neck: Trachea midline; Lungs: diminished in the right base, no rhonchi, no crackles, no wheeze CV: RRR, S1, S2, no MRGs  Abdomen: Soft, non-tender; non-distended, BS present  Extremities: No peripheral edema, radial and DP pulses present bilaterally  Psych: Appropriate affect Neuro: Alert and oriented to person and place, no focal deficit   Labs:  Incomplete pleural fluid analysis, protein ratio is the only thing we have to look at which is >0.5, based on admission labs  Pleural fluid cytology pending, cultures neg to date  Cell count which is neutophil predominate   A: Right pulmonary abscess, likely bacterial pneumonia, likely a small parapneumonic effusion, neutrophil predominate (also with associated pain on admission)  Rounded cavity with air that rises Right Mainstem endobronchial lesion, dependent on imaging, likely represents mucus  Mildly enlarged adenopathy within the mediastinum, likely reactive   The patients presentation and symptomatology appears that of an infection. The necrotic and heterogenous appearing conglomerate of lesions within the base could represent malignancy in a gentleman with his smoking history therefore not excluded.    Subsegmental PE, appears to be in situ with associated parenchymal lesions   P: I agree with Dr. 68 assessment and previous recommendations  Would discharge on augmentin BID for 3-4 weeks  We will arrange follow up in clinic. He needs repeat CT imaging of the chest to see if the consolidative areas are improving as well as resolution of the adenopathy.  Continue anticoagulation on discharge  I will setup outpatient follow up with myself or Dr. Kavin Leech.   Please call with any questions.   Delton Coombes, DO [facility] 07/23/2019 11:19 AM

## 2019-07-23 NOTE — Telephone Encounter (Signed)
Sure 2 view CXR would be good prior to visit [name] can decide about repeat ct based on this.  Thanks Josephine Igo, DO [facility] 07/23/2019 3:16 PM

## 2019-07-24 LAB — CULTURE, RESPIRATORY W GRAM STAIN: Culture: NORMAL

## 2019-07-25 LAB — CULTURE, BLOOD (SINGLE): Culture: NO GROWTH

## 2019-07-26 ENCOUNTER — Telehealth: Payer: Self-pay

## 2019-07-26 LAB — CULTURE, BLOOD (SINGLE)
Culture: NO GROWTH
Special Requests: ADEQUATE

## 2019-07-26 NOTE — Telephone Encounter (Signed)
Transition Care Management Follow-up Telephone Call  Date of discharge and from where: 07/23/2019, [facility]     How have you been since you were released from the hospital? He just spoke about missing the medications that he was supposed to pick up from the pharmacy.   Any questions or concerns? His concerns were regarding medications. He said that he does not have the antibiotic or eliquis. He noted that he called [facility] pharmacy and was told that another order was needed for the eliquis.  He said that the only meds he picked up were the cough medicine and nicoderm.    This CM spoke to [facility] who explained that the augmentin is on hold and is $10.  [facility] does not carry eliquis starter pack the order would need to be re-written for the medication but not the starter pack.   Informed him that [facility] does not carry norco and he would need to get that filled at another pharmacy. He said that he has a written prescription. He explained that the tylenol extra strength that he has been taking has not been helping with his pain.  Informed him that Marcus Pace would be notified of his concerns about the medications.  Informed him that he can pick up the augmentin at the pharmacy  - cost $10.   Items Reviewed:  Did the pt receive and understand the discharge instructions provided?  he said that he had the instructions and didn't have any questions other than those noted above   Medications obtained and verified?  he said that he has all medications except the augmentin, eliquis and norco. He did not want to review the medication.  this CM reminded him that he is supposed to stop taking the ibuprofen and meloxicam.   Any new allergies since your discharge?  none reported   Do you have support at home?  yes he said that he is staying with his sister, Marcus Pace.   Other (ie: DME, Home Health, etc) no home health or DME ordered and he has no DME at home  Functional Questionnaire: (I =  Independent and D = Dependent) ADL's: independent   Follow up appointments reviewed:    PCP Hospital f/u appt confirmed? Scheduled appointment with Marcus Pace - 07/28/2019 @ 1050  Specialist Hospital f/u appt confirmed? pulmonary appointment 08/24/2019  Are transportation arrangements needed?  no , he said that his sister will drive him to appointments  If their condition worsens, is the pt aware to call  their PCP or go to the ED?  yes  Was the patient provided with contact information for the PCP's office or ED?  he has the phone number for the clinic  Was the pt encouraged to call back with questions or concerns?  yes

## 2019-07-27 LAB — CULTURE, BODY FLUID W GRAM STAIN -BOTTLE: Culture: NO GROWTH

## 2019-07-27 NOTE — Telephone Encounter (Signed)
Are there options through the transitional pharmacy for the Eliquis or Augmentin?

## 2019-07-28 ENCOUNTER — Other Ambulatory Visit: Payer: Self-pay

## 2019-07-28 ENCOUNTER — Ambulatory Visit: Payer: Self-pay | Attending: Family Medicine | Admitting: Physician Assistant

## 2019-07-28 VITALS — BP 131/91 | HR 99 | Temp 97.0°F | Resp 16 | Ht 72.0 in | Wt 129.4 lb

## 2019-07-28 DIAGNOSIS — R911 Solitary pulmonary nodule: Secondary | ICD-10-CM

## 2019-07-28 DIAGNOSIS — E440 Moderate protein-calorie malnutrition: Secondary | ICD-10-CM

## 2019-07-28 DIAGNOSIS — Z599 Problem related to housing and economic circumstances, unspecified: Secondary | ICD-10-CM

## 2019-07-28 DIAGNOSIS — Z9189 Other specified personal risk factors, not elsewhere classified: Secondary | ICD-10-CM

## 2019-07-28 DIAGNOSIS — I2699 Other pulmonary embolism without acute cor pulmonale: Secondary | ICD-10-CM

## 2019-07-28 DIAGNOSIS — J851 Abscess of lung with pneumonia: Secondary | ICD-10-CM

## 2019-07-28 DIAGNOSIS — Z598 Other problems related to housing and economic circumstances: Secondary | ICD-10-CM

## 2019-07-28 DIAGNOSIS — Z09 Encounter for follow-up examination after completed treatment for conditions other than malignant neoplasm: Secondary | ICD-10-CM

## 2019-07-28 DIAGNOSIS — F1010 Alcohol abuse, uncomplicated: Secondary | ICD-10-CM

## 2019-07-28 MED ORDER — APIXABAN 5 MG PO TABS: ORAL_TABLET | ORAL | 0 refills | Status: DC

## 2019-07-28 MED FILL — !ELIQUIS 5MG TABLET: 5 | 28 days supply | Qty: 70 | Fill #0

## 2019-07-28 NOTE — Progress Notes (Signed)
Patient ID: Marcus Pace, male   DOB: 02-Dec-1955, 64 y.o.   MRN: [id number]   Marcus Pace, is a 64 y.o. male  LFY:[id number]  ENI:[id number]  DOB - March 01, 1956  Subjective:  Chief Complaint and HPI: Marcus Pace is a 64 y.o. male here today for a follow up visit After hospitalization 3/16-3/19/2021.  He has gotten his augmentin or Eliquis yet.  He has pulmonary f/up 08/24/2019.  augmentin is ready at our pharmacy today and I will send a new Eliquis prescription with starter dose instructions.  He denies fever.  He has quit smoking.  His appetite is improving.  Breathing is improving.  He has quit drinking alcohol.    From discharge summary: Right infrahilar spiculated lung mass , loculated effusion.  As per CT scan.  Patient was thought to have complicated pneumonia with lung abscess and loculated pleural effusion after discussing with pulmonary.  There is possibility that there could be underlying mass as well.  Patient underwent IR guided pleural tapping on 07/23/2019.  Gram stain showed no organisms.  Given less than 12 hours.  AFB pending.  Cytology pending.  Patient received IV Zosyn and Zithromax while in the hospital.  Pulmonary recommended Augmentin for next 4 weeks on discharge and outpatient follow-up with pulmonary with potential CT scan repeat in her bronchoscopy.  CT scan of the abdomen pelvis showed heterogeneous liver but MRI did not show any discrete mass.  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  Discussed with IR about it. Sputum showed gram-positive cocci in chains and rods.  Chronic cigarette smoker. Smokes half pack a day.Continuenicotine patch on discharge.  Patient was extensively counseled about it.  8 mm sclerotic lesion in the right S1. Uncertain significance at this time.  Unable to biopsy/low yield even if able to  Nonocclusive peripheral pulmonary emboli on the right side.  Patient received heparin drip in  hospitalization.  Lower extremity ultrasound showed no evidence of DVT.  Patient was changed to Eliquis on discharge.  Liver lesions - mets vs hemangioma as per CT scan. MRI did not show any discrete masses No evidence of of cirrhosis.  Mild proximal descending colitis/nondistended colon. Change in stool caliber. Possibility that the patient might need a GI evaluation at some point.  Alcohol use disorder.No withdrawal symptoms. Drinks beer every day.   He did not have any withdrawal symptoms during hospitalization.    Disposition.  At this time, patient is stable for disposition home.  Patient will have to follow-up with primary care provider and pulmonary as outpatient.  Follow-up with pulmonary, will be arranged by the clinic.  ED/Hospital notes reviewed.   Social History:  Currently staying with his sister  ROS:   Constitutional:  No f/c, No night sweats, No unexplained weight loss. EENT:  No vision changes, No blurry vision, No hearing changes. No mouth, throat, or ear problems.  Respiratory: minimal cough, improving SOB Cardiac: No CP, no palpitations GI:  No abd pain, No N/V/D. GU: No Urinary s/sx Musculoskeletal: No joint pain Neuro: No headache, no dizziness, no motor weakness.  Skin: No rash Endocrine:  No polydipsia. No polyuria.  Psych: Denies SI/HI  No problems updated.  ALLERGIES: No Known Allergies  PAST MEDICAL HISTORY: Past Medical History:  Diagnosis Date  . Bacteremia    2016 or so with hospitalization  . Inguinal hernia recurrent unilateral    herniation of mesentary and loops of bowel to right scrotal sac, 4.5 cm defect  MEDICATIONS AT HOME: Prior to Admission medications   Medication Sig Start Date End Date Taking? Authorizing Provider  amoxicillin-clavulanate (AUGMENTIN) 875-125 MG tablet Take 1 tablet by mouth 2 (two) times daily for 28 days. 07/23/19 08/20/19  Pokhrel, Corrie Mckusick, MD  apixaban (ELIQUIS) 5 MG TABS tablet 2 tabs twice daily  for 1 week, then 1 tablet twice daily thereafter 07/28/19   Argentina Donovan, PA-C  feeding supplement, ENSURE ENLIVE, (ENSURE ENLIVE) LIQD Take 237 mLs by mouth daily. Patient not taking: Reported on 12/22/2017 08/04/15   Jonetta Osgood, MD  guaiFENesin-codeine 100-10 MG/5ML syrup Take 5 mLs by mouth every 4 (four) hours as needed for cough. 07/23/19   Pokhrel, Corrie Mckusick, MD  HYDROcodone-acetaminophen (NORCO/VICODIN) 5-325 MG tablet Take 1 tablet by mouth every 6 (six) hours as needed for moderate pain. 07/23/19   Pokhrel, Corrie Mckusick, MD  nicotine (NICODERM CQ - DOSED IN MG/24 HOURS) 14 mg/24hr patch Place 1 patch (14 mg total) onto the skin daily. 07/24/19   Pokhrel, Corrie Mckusick, MD  tiZANidine (ZANAFLEX) 4 MG tablet Take 1 tablet (4 mg total) by mouth at bedtime as needed for muscle spasms. 07/08/19   Jaynee Eagles, PA-C     Objective:  EXAM:   Vitals:   07/28/19 1052  BP: (!) 131/91  Pulse: 99  Resp: 16  Temp: (!) 97 F (36.1 C)  SpO2: 96%  Weight: 129 lb 6.4 oz (58.7 kg)  Height: 6' (1.829 m)    General appearance : A&OX3. NAD. Non-toxic-appearing, he is very thin/almost cachectic HEENT: Atraumatic and Normocephalic.  PERRLA. EOM intact.  Neck: supple, no JVD. No cervical lymphadenopathy. No thyromegaly Chest/Lungs:  Breathing-non-labored, Good air entry bilaterally, breath sounds normal without rales, rhonchi, or wheezing  CVS: S1 S2 regular, no murmurs, gallops, rubs  Extremities: Bilateral Lower Ext shows no edema, both legs are warm to touch with = pulse throughout Neurology:  CN II-XII grossly intact, Non focal.   Psych:  TP linear. J/I fair. slower speech. Appropriate eye contact and affect.  Skin:  No Rash  Data Review No results found for: HGBA1C   Assessment & Plan   1. Lesion of right lung F/up pulm 08/24/2019-reviewed address and appt witme with patient and highlighted on AVS- CBC with Differential/Platelet - Comprehensive metabolic panel  2. Abscess of upper lobe of right  lung with pneumonia ([place]) Checked with the pharmacist;  Augmentin is ready for pick up and he will start today  3. Hospital discharge follow-up improving - CBC with Differential/Platelet - Comprehensive metabolic panel  4. Pulmonary embolism without acute cor pulmonale, unspecified chronicity, unspecified pulmonary embolism type (HCC)-see #1 Discussed with pharmacy and will prescribe in starter dose sig but not "as starter pack"  Subsequent RF for 5 mg bid will need to be given.  Patient will p/up today - apixaban (ELIQUIS) 5 MG TABS tablet; 2 tabs twice daily for 1 week, then 1 tablet twice daily thereafter  Dispense: 70 tablet; Refill: 0  5. Financial difficulties Has discussed with CSW.    6. Malnutrition of moderate degree Drinking ensure and eating.  Reviewed nutrition  7. High risk for readmission Due to h/o smoking(only quit in last few weeks), emphysema, only fair J/I, possible recurrence of alcohol use(falls,etc)  8. Alcohol abuse I have counseled the patient at length about substance abuse and addiction.  12 step meetings/recovery recommended.  Local 12 step meeting lists were given and attendance was encouraged.  Patient expresses understanding. Gave AA website too - CBC with Differential/Platelet -  Comprehensive metabolic panel -explained dangers of bleeding/falls/etc esp on Eliquis  hospital notes and labs reviewed extensively and summarized above  Patient have been counseled extensively about nutrition and exercise  Return in about 1 month (around 08/28/2019) for dr fulp-chronic conditions.  The patient was given clear instructions to go to ER or return to medical center if symptoms don't improve, worsen or new problems develop. The patient verbalized understanding. The patient was told to call to get lab results if they haven't heard anything in the next week.     Georgian Co, PA-C [facility] [place], [place] [phone]    07/28/2019, 11:16 AM

## 2019-07-28 NOTE — Patient Instructions (Signed)
[email or web address] is the link for Alcoholics anonymous

## 2019-07-29 ENCOUNTER — Other Ambulatory Visit: Payer: Self-pay | Admitting: Physician Assistant

## 2019-07-29 ENCOUNTER — Telehealth: Payer: Self-pay

## 2019-07-29 LAB — COMPREHENSIVE METABOLIC PANEL
ALT: 200 IU/L — ABNORMAL HIGH (ref 0–44)
AST: 100 IU/L — ABNORMAL HIGH (ref 0–40)
Albumin/Globulin Ratio: 0.8 — ABNORMAL LOW (ref 1.2–2.2)
Albumin: 3.6 g/dL — ABNORMAL LOW (ref 3.8–4.8)
Alkaline Phosphatase: 132 IU/L — ABNORMAL HIGH (ref 39–117)
BUN/Creatinine Ratio: 14 (ref 10–24)
BUN: 10 mg/dL (ref 8–27)
Bilirubin Total: 0.5 mg/dL (ref 0.0–1.2)
CO2: 25 mmol/L (ref 20–29)
Calcium: 9.5 mg/dL (ref 8.6–10.2)
Chloride: 99 mmol/L (ref 96–106)
Creatinine, Ser: 0.73 mg/dL — ABNORMAL LOW (ref 0.76–1.27)
GFR calc Af Amer: 115 mL/min/{1.73_m2} (ref >59)
GFR calc non Af Amer: 99 mL/min/{1.73_m2} (ref >59)
Globulin, Total: 4.5 g/dL (ref 1.5–4.5)
Glucose: 74 mg/dL (ref 65–99)
Potassium: 4.7 mmol/L (ref 3.5–5.2)
Sodium: 139 mmol/L (ref 134–144)
Total Protein: 8.1 g/dL (ref 6.0–8.5)

## 2019-07-29 LAB — CBC WITH DIFFERENTIAL/PLATELET
Basophils Absolute: 0.1 10*3/uL (ref 0.0–0.2)
Basos: 0 %
EOS (ABSOLUTE): 0.5 10*3/uL — ABNORMAL HIGH (ref 0.0–0.4)
Eos: 4 %
Hematocrit: 43.6 % (ref 37.5–51.0)
Hemoglobin: 14.4 g/dL (ref 13.0–17.7)
Immature Grans (Abs): 0.1 10*3/uL (ref 0.0–0.1)
Immature Granulocytes: 1 %
Lymphocytes Absolute: 1.1 10*3/uL (ref 0.7–3.1)
Lymphs: 10 %
MCH: 28.1 pg (ref 26.6–33.0)
MCHC: 33 g/dL (ref 31.5–35.7)
MCV: 85 fL (ref 79–97)
Monocytes Absolute: 1.1 10*3/uL — ABNORMAL HIGH (ref 0.1–0.9)
Monocytes: 10 %
Neutrophils Absolute: 9 10*3/uL — ABNORMAL HIGH (ref 1.4–7.0)
Neutrophils: 75 %
Platelets: 588 10*3/uL — ABNORMAL HIGH (ref 150–450)
RBC: 5.12 x10E6/uL (ref 4.14–5.80)
RDW: 13.1 % (ref 11.6–15.4)
WBC: 11.8 10*3/uL — ABNORMAL HIGH (ref 3.4–10.8)

## 2019-07-29 MED ORDER — AMOXICILLIN-POT CLAVULANATE 875-125 MG PO TABS: 1.0000 | ORAL_TABLET | Freq: Two times a day (BID) | ORAL | 0 refills | Status: AC

## 2019-07-29 MED FILL — AMOX-CLAV 875-125 MG TABLET: 875-125 | 28 days supply | Qty: 56 | Fill #0

## 2019-07-29 NOTE — Telephone Encounter (Signed)
Georgian Co, PA  re-ordered the eliquis for him and he picked it up yesterday. Our pharmacy has the order or augmentin and they are in the process of clarifying the order.

## 2019-08-02 LAB — ACID FAST SMEAR (AFB, MYCOBACTERIA): Acid Fast Smear: NEGATIVE

## 2019-08-24 ENCOUNTER — Other Ambulatory Visit: Payer: Self-pay

## 2019-08-24 ENCOUNTER — Ambulatory Visit (INDEPENDENT_AMBULATORY_CARE_PROVIDER_SITE_OTHER): Payer: Self-pay | Admitting: Emergency Medicine

## 2019-08-24 ENCOUNTER — Encounter: Payer: Self-pay | Admitting: Emergency Medicine

## 2019-08-24 ENCOUNTER — Ambulatory Visit (INDEPENDENT_AMBULATORY_CARE_PROVIDER_SITE_OTHER): Payer: Self-pay

## 2019-08-24 ENCOUNTER — Other Ambulatory Visit: Payer: Self-pay | Admitting: *Deleted

## 2019-08-24 DIAGNOSIS — J851 Abscess of lung with pneumonia: Secondary | ICD-10-CM

## 2019-08-24 DIAGNOSIS — Z720 Tobacco use: Secondary | ICD-10-CM

## 2019-08-24 DIAGNOSIS — I2699 Other pulmonary embolism without acute cor pulmonale: Secondary | ICD-10-CM

## 2019-08-24 NOTE — Assessment & Plan Note (Addendum)
His chest x-ray looks much improved.  More consistent with a pulmonary abscess than malignancy.  I think we can refer him to the lung cancer screening program which will give [place] nonurgent CT data to compare, assess for scarring, any residual findings.  He will continue his Augmentin through 4/28 as scheduled.  I discussed TB testing with him today.  I may decide to get a QuantiFERON gold next time.  He asked me to defer today.

## 2019-08-24 NOTE — Assessment & Plan Note (Signed)
-   Continue Eliquis 

## 2019-08-24 NOTE — Assessment & Plan Note (Signed)
I will refer him to the lung cancer screening program.  I am sure that he has emphysema based on his imaging and his clinical status.  We will perform full PFT, define his degree of obstruction and then start bronchodilator therapy going forward.  Discussed cessation briefly with him today.  We will continue to work on this.  He is currently using nicotine patch to cut down, using 5 cigarettes daily.

## 2019-08-24 NOTE — Progress Notes (Signed)
Subjective:    Patient ID: Marcus Pace, male    DOB: 09-Mar-1956, 64 y.o.   MRN: [id number]  HPI This is a new consultation for 64 year old man with history of tobacco, smoking 5 cig a day (50 pack years), alcohol use, prior hospitalization for bacteremia, who was admitted 3/16-3/19 with fever, sweats, chest discomfort and hemoptysis.  CT scan of the chest reviewed by me done on 07/20/2019 showed centrilobular emphysematous change, bilateral basilar bronchiectasis, aspirated material in the right mainstem bronchus, and a right infrahilar spiculated 6 cm consolidation with multiple internal loculated fluid components and thick wall suggestive of a lung abscess versus mass.  Also noted were small right infrahilar segmental PE.  He was treated with antibiotics for possible aspiration pneumonia with lung abscess, anticoagulation.  He underwent thoracentesis of a small loculated right pleural effusion on 3/19, Gram stain and culture negative. He was discharged to complete 4 more weeks of Augmentiin. Also on eliquis. He reports cough is much better, no sputum. Fevers resolved. He does have some exertional SOB. No wheeze.   Chest x-ray today, reviewed by me and shows improvement in his right perihilar infiltrate, much smaller.  I do not see any air-fluid level.   Review of Systems  Past Medical History:  Diagnosis Date  . Bacteremia    2016 or so with hospitalization  . Inguinal hernia recurrent unilateral    herniation of mesentary and loops of bowel to right scrotal sac, 4.5 cm defect     No family history on file.   Social History   Socioeconomic History  . Marital status: Single    Spouse name: Not on file  . Number of children: Not on file  . Years of education: Not on file  . Highest education level: Not on file  Occupational History  . Not on file  Tobacco Use  . Smoking status: Current Every Day Smoker    Packs/day: 1.00  . Smokeless tobacco: Never Used  Substance and  Sexual Activity  . Alcohol use: Yes  . Drug use: Never  . Sexual activity: Not Currently  Other Topics Concern  . Not on file  Social History Narrative   Lives with sister and grown nephew   Social Determinants of Health   Financial Resource Strain:   . Difficulty of Paying Living Expenses:   Food Insecurity:   . Worried About Charity fundraiser in the Last Year:   . Arboriculturist in the Last Year:   Transportation Needs:   . Film/video editor (Medical):   Marland Kitchen Lack of Transportation (Non-Medical):   Physical Activity:   . Days of Exercise per Week:   . Minutes of Exercise per Session:   Stress:   . Feeling of Stress :   Social Connections:   . Frequency of Communication with Friends and Family:   . Frequency of Social Gatherings with Friends and Family:   . Attends Religious Services:   . Active Member of Clubs or Organizations:   . Attends Archivist Meetings:   Marland Kitchen Marital Status:   Intimate Partner Violence:   . Fear of Current or Ex-Partner:   . Emotionally Abused:   Marland Kitchen Physically Abused:   . Sexually Abused:     Has worked in Press photographer No fumes, chemicals No [employer]  From [employer].  He may have been exposed to TB as a child.   No Known Allergies   Outpatient Medications Prior to Visit  Medication Sig  Dispense Refill  . amoxicillin-clavulanate (AUGMENTIN) 875-125 MG tablet Take 1 tablet by mouth 2 (two) times daily for 28 days. 56 tablet 0  . apixaban (ELIQUIS) 5 MG TABS tablet 2 tabs twice daily for 1 week, then 1 tablet twice daily thereafter 70 tablet 0  . feeding supplement, ENSURE ENLIVE, (ENSURE ENLIVE) LIQD Take 237 mLs by mouth daily. 30 Bottle 0  . nicotine (NICODERM CQ - DOSED IN MG/24 HOURS) 14 mg/24hr patch Place 1 patch (14 mg total) onto the skin daily. 28 patch 0  . guaiFENesin-codeine 100-10 MG/5ML syrup Take 5 mLs by mouth every 4 (four) hours as needed for cough. (Patient not taking: Reported on 08/24/2019) 120 mL 0  .  HYDROcodone-acetaminophen (NORCO/VICODIN) 5-325 MG tablet Take 1 tablet by mouth every 6 (six) hours as needed for moderate pain. (Patient not taking: Reported on 08/24/2019) 15 tablet 0  . tiZANidine (ZANAFLEX) 4 MG tablet Take 1 tablet (4 mg total) by mouth at bedtime as needed for muscle spasms. (Patient not taking: Reported on 08/24/2019) 30 tablet 0   No facility-administered medications prior to visit.        Objective:   Physical Exam  Vitals:   08/24/19 1130  BP: 112/68  Pulse: (!) 110  Temp: (!) 97.5 F (36.4 C)  TempSrc: Temporal  SpO2: 98%  Weight: 135 lb (61.2 kg)  Height: 6\' 1"  (1.854 m)    Gen: Pleasant, thin, in no distress,  normal affect  ENT: No lesions,  mouth clear,  oropharynx clear, no postnasal drip  Neck: No JVD, no stridor  Lungs: No use of accessory muscles, no crackles or wheezing on normal respiration, no wheeze on forced expiration  Cardiovascular: RRR, heart sounds normal, no murmur or gallops, no peripheral edema  Musculoskeletal: No deformities, no cyanosis or clubbing  Neuro: alert, awake, non focal  Skin: Warm, no lesions or rash      Assessment & Plan:  Pulmonary abscess (HCC) His chest x-ray looks much improved.  More consistent with a pulmonary abscess than malignancy.  I think we can refer him to the lung cancer screening program which will give nonurgent CT data to compare, assess for scarring, any residual findings.  He will continue his Augmentin through 4/28 as scheduled.  I discussed TB testing with him today.  I may decide to get a QuantiFERON gold next time.  He asked me to defer today.  Pulmonary emboli (HCC) Continue Eliquis  Tobacco abuse I will refer him to the lung cancer screening program.  I am sure that he has emphysema based on his imaging and his clinical status.  We will perform full PFT, define his degree of obstruction and then start bronchodilator therapy going forward.  Discussed cessation briefly with him  today.  We will continue to work on this.  He is currently using nicotine patch to cut down, using 5 cigarettes daily.  5/28, MD, PhD 08/24/2019, 11:50 AM [facility] [phone] or if no answer [phone]

## 2019-08-24 NOTE — Patient Instructions (Addendum)
We will refer you to the lung cancer screening program for CT scanning We will perform full pulmonary function testing Depending on your breathing tests we will decide which inhaler medications will help you the best.  You need to work hard on decreasing your smoking.  Our ultimate goal will be to stop altogether. We will consider repeating a TB blood test at your next visit.  Follow with [name] next available with full pulmonary function testing on the same day.

## 2019-08-25 ENCOUNTER — Ambulatory Visit: Payer: Self-pay | Attending: Family Medicine | Admitting: Family Medicine

## 2019-08-25 ENCOUNTER — Ambulatory Visit: Payer: Self-pay | Admitting: Family Medicine

## 2019-08-25 ENCOUNTER — Encounter: Payer: Self-pay | Admitting: Family Medicine

## 2019-08-25 VITALS — BP 129/75 | HR 94 | Ht 73.0 in | Wt 134.0 lb

## 2019-08-25 DIAGNOSIS — R7989 Other specified abnormal findings of blood chemistry: Secondary | ICD-10-CM

## 2019-08-25 DIAGNOSIS — M5416 Radiculopathy, lumbar region: Secondary | ICD-10-CM

## 2019-08-25 DIAGNOSIS — I2699 Other pulmonary embolism without acute cor pulmonale: Secondary | ICD-10-CM

## 2019-08-25 MED ORDER — TIZANIDINE HCL 4 MG PO TABS: 4.0000 mg | ORAL_TABLET | Freq: Three times a day (TID) | ORAL | 1 refills | Status: DC | PRN

## 2019-08-25 MED ORDER — APIXABAN 5 MG PO TABS: 5.0000 mg | ORAL_TABLET | Freq: Two times a day (BID) | ORAL | 1 refills | Status: DC

## 2019-08-25 MED FILL — !ELIQUIS 5MG TABLET: 5 | 30 days supply | Qty: 60 | Fill #0

## 2019-08-25 MED FILL — tiZANidine HCL 4 MG TABS: 4 | 20 days supply | Qty: 60 | Fill #0

## 2019-08-25 NOTE — Progress Notes (Signed)
Subjective:  Patient ID: Marcus Pace, male    DOB: Jan 16, 1956  Age: 64 y.o. MRN: [id number]  CC: Pain   HPI Marcus Pace is a 64 year old male with a history of chronic low back pain, tobacco abuse, pulmonary embolism diagnosed during hospitalization in 07/2019 (currently on Eliquis) also hospitalized for community-acquired pneumonia with CT findings of emphysema, right infrahilar spiculated consolidation suggestive of lung abscess versus mass who presents today for an acute visit. He was seen by pulmonary yesterday and thought to have lung abscess rather than malignancy and is currently on Augmentin till 09/01/2019.  He complains of low  back pain radiating down his left leg preventing him from prolonged standing and he also has trouble getting dressed. He now has to use a walker; symptoms have been present for 1 month. Bending forward relieves his pain.  Pain makes it difficult to sleep.  Denies loss of sphincteric functions and has had no recent fall. He has no Tizanidine (even though it appears on his med list) and no medications for pain; Tylenol has been ineffective. X-ray of the lumbar spine from 07/2019 revealed: IMPRESSION: Marked degenerative changes have developed in the lumbar spine since 2006. No acute osseous abnormality. Possible distal right ureteral calculus. Consider CT for further assessment as clinically warranted.  Past Medical History:  Diagnosis Date  . Bacteremia    2016 or so with hospitalization  . Inguinal hernia recurrent unilateral    herniation of mesentary and loops of bowel to right scrotal sac, 4.5 cm defect    No past surgical history on file.  No family history on file.  No Known Allergies  Outpatient Medications Prior to Visit  Medication Sig Dispense Refill  . amoxicillin-clavulanate (AUGMENTIN) 875-125 MG tablet Take 1 tablet by mouth 2 (two) times daily for 28 days. 56 tablet 0  . apixaban (ELIQUIS) 5 MG TABS tablet 2 tabs twice  daily for 1 week, then 1 tablet twice daily thereafter 70 tablet 0  . nicotine (NICODERM CQ - DOSED IN MG/24 HOURS) 14 mg/24hr patch Place 1 patch (14 mg total) onto the skin daily. 28 patch 0  . feeding supplement, ENSURE ENLIVE, (ENSURE ENLIVE) LIQD Take 237 mLs by mouth daily. (Patient not taking: Reported on 08/25/2019) 30 Bottle 0  . guaiFENesin-codeine 100-10 MG/5ML syrup Take 5 mLs by mouth every 4 (four) hours as needed for cough. (Patient not taking: Reported on 08/24/2019) 120 mL 0  . HYDROcodone-acetaminophen (NORCO/VICODIN) 5-325 MG tablet Take 1 tablet by mouth every 6 (six) hours as needed for moderate pain. (Patient not taking: Reported on 08/24/2019) 15 tablet 0  . tiZANidine (ZANAFLEX) 4 MG tablet Take 1 tablet (4 mg total) by mouth at bedtime as needed for muscle spasms. (Patient not taking: Reported on 08/24/2019) 30 tablet 0   No facility-administered medications prior to visit.     ROS Review of Systems  Constitutional: Negative for activity change and appetite change.  HENT: Negative for sinus pressure and sore throat.   Eyes: Negative for visual disturbance.  Respiratory: Negative for cough, chest tightness and shortness of breath.   Cardiovascular: Negative for chest pain and leg swelling.  Gastrointestinal: Negative for abdominal distention, abdominal pain, constipation and diarrhea.  Endocrine: Negative.   Genitourinary: Negative for dysuria.  Musculoskeletal:       See HPI  Skin: Negative for rash.  Allergic/Immunologic: Negative.   Neurological: Negative for weakness, light-headedness and numbness.  Psychiatric/Behavioral: Negative for dysphoric mood and suicidal ideas.  Objective:  BP 129/75   Pulse 94   Ht _0  (1.854 m)   Wt 134 lb (60.8 kg)   SpO2 98%   BMI 17.68 kg/m   BP/Weight 08/25/2019 08/24/2019 3/81/0175  Systolic BP 102 585 277  Diastolic BP 75 68 91  Wt. (Lbs) 134 135 129.4  BMI 17.68 17.81 17.55      Physical Exam Constitutional:       Appearance: He is well-developed.  Neck:     Vascular: No JVD.  Cardiovascular:     Rate and Rhythm: Normal rate.     Heart sounds: Normal heart sounds. No murmur.  Pulmonary:     Effort: Pulmonary effort is normal.     Breath sounds: Normal breath sounds. No wheezing or rales.  Chest:     Chest wall: No tenderness.  Abdominal:     General: Bowel sounds are normal. There is no distension.     Palpations: Abdomen is soft. There is no mass.     Tenderness: There is no abdominal tenderness.  Musculoskeletal:        General: No tenderness.     Right lower leg: No edema.     Left lower leg: No edema.     Comments: Positive straight leg raise on the left  Neurological:     Mental Status: He is alert and oriented to person, place, and time.     Gait: Gait abnormal (Bent over while walking).  Psychiatric:        Mood and Affect: Mood normal.     CMP Latest Ref Rng & Units 07/28/2019 07/23/2019 07/22/2019  Glucose 65 - 99 mg/dL 74 115(H) 112(H)  BUN 8 - 27 mg/dL _1 Creatinine 0.76 - 1.27 mg/dL 0.73(L) 0.79 0.70  Sodium 134 - 144 mmol/L 139 138 136  Potassium 3.5 - 5.2 mmol/L 4.7 4.5 3.7  Chloride 96 - 106 mmol/L 99 102 103  CO2 20 - 29 mmol/L _2 Calcium 8.6 - 10.2 mg/dL 9.5 8.9 8.2(L)  Total Protein 6.0 - 8.5 g/dL 8.1 7.7 -  Total Bilirubin 0.0 - 1.2 mg/dL 0.5 0.4 -  Alkaline Phos 39 - 117 IU/L 132(H) 95 -  AST 0 - 40 IU/L 100(H) 89(H) -  ALT 0 - 44 IU/L 200(H) 117(H) -    Lipid Panel  No results found for: CHOL, TRIG, HDL, CHOLHDL, VLDL, LDLCALC, LDLDIRECT  CBC    Component Value Date/Time   WBC 11.8 (H) 07/28/2019 1116   WBC 12.6 (H) 07/23/2019 0524   RBC 5.12 07/28/2019 1116   RBC 4.85 07/23/2019 0524   HGB 14.4 07/28/2019 1116   HCT 43.6 07/28/2019 1116   PLT 588 (H) 07/28/2019 1116   MCV 85 07/28/2019 1116   MCH 28.1 07/28/2019 1116   MCH 28.7 07/23/2019 0524   MCHC 33.0 07/28/2019 1116   MCHC 33.1 07/23/2019 0524   RDW 13.1 07/28/2019 1116    LYMPHSABS 1.1 07/28/2019 1116   MONOABS 1.8 (H) 07/20/2019 1623   EOSABS 0.5 (H) 07/28/2019 1116   BASOSABS 0.1 07/28/2019 1116    No results found for: HGBA1C  Assessment & Plan:   1. Lumbar radiculopathy Uncontrolled He does have advanced degenerative changes with lumbar radiculopathy Refilled Tizanidine We will refer to rehab medicine - Ambulatory referral to Physical Medicine Rehab - tiZANidine (ZANAFLEX) 4 MG tablet; Take 1 tablet (4 mg total) by mouth every 8 (eight) hours as needed for muscle spasms.  Dispense: 60 tablet;  Refill: 1  2. Elevated LFTs Last LFTs were elevated We will repeat today - CMP14+EGFR  3. Pulmonary embolism without acute cor pulmonale, unspecified chronicity, unspecified pulmonary embolism type ([place]) Diagnosed in 07/2019 Continue Eliquis - apixaban (ELIQUIS) 5 MG TABS tablet; Take 1 tablet (5 mg total) by mouth 2 (two) times daily.  Dispense: 60 tablet; Refill: 1   No orders of the defined types were placed in this encounter.   Follow-up: Return in about 1 month (around 09/24/2019) for Dr Chapman Fitch -chronic disease management.       Charlott Rakes, MD, FAAFP. [facility] [place], [place]   08/25/2019, 11:29 AM

## 2019-08-25 NOTE — Progress Notes (Signed)
States that he is having pain in back and left leg.  States that he is not able to sleep.

## 2019-08-26 ENCOUNTER — Telehealth: Payer: Self-pay

## 2019-08-26 LAB — CMP14+EGFR
ALT: 35 IU/L (ref 0–44)
AST: 36 IU/L (ref 0–40)
Albumin/Globulin Ratio: 1.4 (ref 1.2–2.2)
Albumin: 4.4 g/dL (ref 3.8–4.8)
Alkaline Phosphatase: 84 IU/L (ref 39–117)
BUN/Creatinine Ratio: 15 (ref 10–24)
BUN: 12 mg/dL (ref 8–27)
Bilirubin Total: 1 mg/dL (ref 0.0–1.2)
CO2: 25 mmol/L (ref 20–29)
Calcium: 9.5 mg/dL (ref 8.6–10.2)
Chloride: 99 mmol/L (ref 96–106)
Creatinine, Ser: 0.8 mg/dL (ref 0.76–1.27)
GFR calc Af Amer: 111 mL/min/{1.73_m2} (ref >59)
GFR calc non Af Amer: 96 mL/min/{1.73_m2} (ref >59)
Globulin, Total: 3.2 g/dL (ref 1.5–4.5)
Glucose: 79 mg/dL (ref 65–99)
Potassium: 4.7 mmol/L (ref 3.5–5.2)
Sodium: 140 mmol/L (ref 134–144)
Total Protein: 7.6 g/dL (ref 6.0–8.5)

## 2019-08-26 NOTE — Telephone Encounter (Signed)
Patient was called and a voicemail was left informing patient to return phone call for lab results. 

## 2019-08-26 NOTE — Telephone Encounter (Signed)
-----   Message from [name], MD sent at 08/26/2019  1:10 PM EDT ----- Please inform the patient that labs are normal. Thank you.

## 2019-08-27 NOTE — Telephone Encounter (Signed)
Patient was called and a voicemail was left informing patient to return phone call for lab results. 

## 2019-08-30 ENCOUNTER — Telehealth: Payer: Self-pay | Admitting: Family Medicine

## 2019-08-30 NOTE — Telephone Encounter (Signed)
Patient was called and a voicemail was left informing patient to return phone call for lab results. 

## 2019-08-30 NOTE — Telephone Encounter (Signed)
Patient called and requested for lab results. Patient was identified by 2 patient identifiers. Lab results were given. Patient had no further questions or concerns at this time.

## 2019-09-01 ENCOUNTER — Encounter: Payer: Self-pay | Admitting: Physical Medicine and Rehabilitation

## 2019-09-09 LAB — ACID FAST CULTURE WITH REFLEXED SENSITIVITIES (MYCOBACTERIA): Acid Fast Culture: NEGATIVE

## 2019-09-22 ENCOUNTER — Ambulatory Visit (HOSPITAL_COMMUNITY)
Admission: RE | Admit: 2019-09-22 | Discharge: 2019-09-22 | Disposition: A | Discharge status: Home / Self Care | Payer: Self-pay | Source: Ambulatory Visit | Attending: Physical Medicine and Rehabilitation | Admitting: Physical Medicine and Rehabilitation

## 2019-09-22 ENCOUNTER — Encounter: Payer: Self-pay | Admitting: Physical Medicine and Rehabilitation

## 2019-09-22 ENCOUNTER — Other Ambulatory Visit: Payer: Self-pay

## 2019-09-22 ENCOUNTER — Encounter: Payer: Self-pay | Attending: Physical Medicine and Rehabilitation | Admitting: Physical Medicine and Rehabilitation

## 2019-09-22 VITALS — BP 120/81 | HR 100 | Temp 98.5°F | Ht 72.0 in | Wt 137.6 lb

## 2019-09-22 DIAGNOSIS — M79605 Pain in left leg: Secondary | ICD-10-CM | POA: Insufficient documentation

## 2019-09-22 DIAGNOSIS — M48062 Spinal stenosis, lumbar region with neurogenic claudication: Secondary | ICD-10-CM | POA: Insufficient documentation

## 2019-09-22 MED ORDER — GABAPENTIN 300 MG PO CAPS: 300.0000 mg | ORAL_CAPSULE | Freq: Three times a day (TID) | ORAL | 3 refills | Status: DC

## 2019-09-22 MED ORDER — MUSCLE RUB 10-15 % EX CREA: 1.0000 "application " | TOPICAL_CREAM | CUTANEOUS | 0 refills | Status: DC | PRN

## 2019-09-22 MED FILL — GABAPENTIN 300 MG CAPSULE: 300 | 30 days supply | Qty: 90 | Fill #0

## 2019-09-22 NOTE — Progress Notes (Signed)
Subjective:    Patient ID: Marcus Pace, male    DOB: [birth year], 64 y.o.   MRN: [id number]  HPI  Marcus Pace is a 64 year old man who presents with lower back and left thigh pain since March. Stretching and standing straight make the pain worse. In April he started using a cane and RW and has had no falls. He is accompanied by his sister this visit.   He has never had any spinal imaging or imaging of his left hip.   He has pain radiating into his left thigh laterally. It is worst in the morning and when standing. His pain is 5/10 on average, 0/10 now while he is in a seated position. The pain feels intermittent and tingling.   He has been taking Tylenol which does not benefit and he has had elevated LFTs. He took Tizanidine which did not provide relief.   For exercise, he continues to walk every day to the store and back home.   Pain Inventory Average Pain 5 Pain Right Now 0 My pain is intermittent and tingling  In the last 24 hours, has pain interfered with the following? General activity 6 Relation with others 6 Enjoyment of life 6 What TIME of day is your pain at its worst? morning night Sleep (in general) Fair  Pain is worse with: inactivity and standing Pain improves with: rest and medication Relief from Meds: 5  Mobility use a cane use a walker how many minutes can you walk? 15 ability to climb steps?  yes do you drive?  no  Function not employed: date last employed 2019  Neuro/Psych bladder control problems bowel control problems weakness numbness trouble walking spasms  Prior Studies Any changes since last visit?  no x-rays  Physicians involved in your care Any changes since last visit?  no   Family History  Problem Relation Age of Onset  . Cancer Mother   . Heart disease Father    Social History   Socioeconomic History  . Marital status: Single    Spouse name: Not on file  . Number of children: Not on file  . Years of education:  Not on file  . Highest education level: Not on file  Occupational History  . Not on file  Tobacco Use  . Smoking status: Current Every Day Smoker    Packs/day: 1.00  . Smokeless tobacco: Never Used  Substance and Sexual Activity  . Alcohol use: Yes  . Drug use: Never  . Sexual activity: Not Currently  Other Topics Concern  . Not on file  Social History Narrative   Lives with sister and grown nephew   Social Determinants of Health   Financial Resource Strain:   . Difficulty of Paying Living Expenses:   Food Insecurity:   . Worried About Programme researcher, broadcasting/film/video in the Last Year:   . Barista in the Last Year:   Transportation Needs:   . Freight forwarder (Medical):   Marland Kitchen Lack of Transportation (Non-Medical):   Physical Activity:   . Days of Exercise per Week:   . Minutes of Exercise per Session:   Stress:   . Feeling of Stress :   Social Connections:   . Frequency of Communication with Friends and Family:   . Frequency of Social Gatherings with Friends and Family:   . Attends Religious Services:   . Active Member of Clubs or Organizations:   . Attends Banker Meetings:   .  Marital Status:    No past surgical history on file. Past Medical History:  Diagnosis Date  . Arthritis   . Bacteremia    2016 or so with hospitalization  . Inguinal hernia recurrent unilateral    herniation of mesentary and loops of bowel to right scrotal sac, 4.5 cm defect   BP 120/81   Pulse 100   Temp 98.5 F (36.9 C)   Ht 6' (1.829 m)   Wt 137 lb 9.6 oz (62.4 kg)   SpO2 95%   BMI 18.66 kg/m   Opioid Risk Score:   Fall Risk Score:  `1  Depression screen PHQ 2/9  Depression screen [facility] 2/9 09/22/2019 08/25/2019 12/22/2017  Decreased Interest 0 0 3  Down, Depressed, Hopeless 0 0 3  PHQ - 2 Score 0 0 6  Altered sleeping - 3 1  Tired, decreased energy - 0 0  Change in appetite - 0 0  Feeling bad or failure about yourself  - 0 1  Trouble concentrating - 0 0    Moving slowly or fidgety/restless - 0 0  Suicidal thoughts - 0 0  PHQ-9 Score - 3 8    Review of Systems  Musculoskeletal: Positive for gait problem.  Neurological: Positive for weakness and numbness.       Spasms   All other systems reviewed and are negative.      Objective:   Physical Exam Gen: no distress, normal appearing HEENT: oral mucosa pink and moist, NCAT Cardio: Reg rate Chest: normal effort, normal rate of breathing Abd: soft, non-distended Ext: no edema Skin: intact Neuro: AOx3 Musculoskeletal: Normal flexion.  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  --Explained that spinal stenosis tends to occur more commonly as people age and results in pain worse with extension of the spine.  --Ordered XR of lumbar spine and left Hip to help confirm diagnosis and to assess the extent of pathology, and to r/o hip dysfunction.  --Ordered Gabapentin 300mg  TID for neuropathic pain.  --Advised heat packs and minimizing extension of the spine.  --Will call tomorrow with XR results.  --All questions answered. Return to clinic in 2 weeks.

## 2019-09-28 ENCOUNTER — Inpatient Hospital Stay (HOSPITAL_COMMUNITY): Admission: RE | Admit: 2019-09-28 | Payer: Self-pay | Source: Ambulatory Visit

## 2019-10-01 ENCOUNTER — Ambulatory Visit: Payer: Self-pay | Admitting: Emergency Medicine

## 2019-10-06 ENCOUNTER — Ambulatory Visit: Payer: Self-pay | Admitting: Family Medicine

## 2019-10-12 ENCOUNTER — Other Ambulatory Visit: Payer: Self-pay

## 2019-10-12 ENCOUNTER — Ambulatory Visit: Payer: Self-pay | Attending: Family Medicine | Admitting: Family Medicine

## 2019-10-13 ENCOUNTER — Ambulatory Visit: Payer: Self-pay | Admitting: Physical Medicine and Rehabilitation

## 2019-10-14 ENCOUNTER — Encounter: Payer: Self-pay | Attending: Physical Medicine and Rehabilitation | Admitting: Physical Medicine and Rehabilitation

## 2019-10-14 DIAGNOSIS — M48062 Spinal stenosis, lumbar region with neurogenic claudication: Secondary | ICD-10-CM | POA: Insufficient documentation

## 2019-10-14 DIAGNOSIS — M79605 Pain in left leg: Secondary | ICD-10-CM | POA: Insufficient documentation

## 2020-11-15 ENCOUNTER — Emergency Department (HOSPITAL_COMMUNITY): Payer: Self-pay

## 2020-11-15 ENCOUNTER — Observation Stay (HOSPITAL_COMMUNITY)
Admission: EM | Admit: 2020-11-15 | Discharge: 2020-11-16 | Disposition: A | Discharge status: Home / Self Care | Payer: Self-pay | Attending: Internal Medicine | Admitting: Internal Medicine

## 2020-11-15 ENCOUNTER — Other Ambulatory Visit: Payer: Self-pay

## 2020-11-15 ENCOUNTER — Encounter (HOSPITAL_COMMUNITY): Payer: Self-pay | Admitting: *Deleted

## 2020-11-15 DIAGNOSIS — J441 Chronic obstructive pulmonary disease with (acute) exacerbation: Secondary | ICD-10-CM | POA: Insufficient documentation

## 2020-11-15 DIAGNOSIS — J439 Emphysema, unspecified: Secondary | ICD-10-CM | POA: Diagnosis present

## 2020-11-15 DIAGNOSIS — J90 Pleural effusion, not elsewhere classified: Secondary | ICD-10-CM | POA: Diagnosis present

## 2020-11-15 DIAGNOSIS — Z7901 Long term (current) use of anticoagulants: Secondary | ICD-10-CM | POA: Insufficient documentation

## 2020-11-15 DIAGNOSIS — F17210 Nicotine dependence, cigarettes, uncomplicated: Secondary | ICD-10-CM | POA: Insufficient documentation

## 2020-11-15 DIAGNOSIS — Z20822 Contact with and (suspected) exposure to covid-19: Secondary | ICD-10-CM | POA: Insufficient documentation

## 2020-11-15 DIAGNOSIS — R0602 Shortness of breath: Secondary | ICD-10-CM

## 2020-11-15 DIAGNOSIS — J45909 Unspecified asthma, uncomplicated: Secondary | ICD-10-CM | POA: Insufficient documentation

## 2020-11-15 DIAGNOSIS — R918 Other nonspecific abnormal finding of lung field: Secondary | ICD-10-CM | POA: Diagnosis present

## 2020-11-15 DIAGNOSIS — R0902 Hypoxemia: Principal | ICD-10-CM

## 2020-11-15 DIAGNOSIS — R911 Solitary pulmonary nodule: Secondary | ICD-10-CM | POA: Insufficient documentation

## 2020-11-15 DIAGNOSIS — Z720 Tobacco use: Secondary | ICD-10-CM | POA: Diagnosis present

## 2020-11-15 LAB — CBC WITH DIFFERENTIAL/PLATELET
Abs Immature Granulocytes: 0.03 10*3/uL (ref 0.00–0.07)
Basophils Absolute: 0.1 10*3/uL (ref 0.0–0.1)
Basophils Relative: 1 %
Eosinophils Absolute: 0.3 10*3/uL (ref 0.0–0.5)
Eosinophils Relative: 4 %
HCT: 48 % (ref 39.0–52.0)
Hemoglobin: 16.2 g/dL (ref 13.0–17.0)
Immature Granulocytes: 0 %
Lymphocytes Relative: 14 %
Lymphs Abs: 1 10*3/uL (ref 0.7–4.0)
MCH: 29.7 pg (ref 26.0–34.0)
MCHC: 33.8 g/dL (ref 30.0–36.0)
MCV: 87.9 fL (ref 80.0–100.0)
Monocytes Absolute: 0.8 10*3/uL (ref 0.1–1.0)
Monocytes Relative: 11 %
Neutro Abs: 5 10*3/uL (ref 1.7–7.7)
Neutrophils Relative %: 70 %
Platelets: 289 10*3/uL (ref 150–400)
RBC: 5.46 MIL/uL (ref 4.22–5.81)
RDW: 14.1 % (ref 11.5–15.5)
WBC: 7.2 10*3/uL (ref 4.0–10.5)
nRBC: 0 % (ref 0.0–0.2)

## 2020-11-15 LAB — COMPREHENSIVE METABOLIC PANEL
ALT: 25 U/L (ref 0–44)
AST: 30 U/L (ref 15–41)
Albumin: 3.9 g/dL (ref 3.5–5.0)
Alkaline Phosphatase: 89 U/L (ref 38–126)
Anion gap: 7 (ref 5–15)
BUN: 9 mg/dL (ref 8–23)
CO2: 28 mmol/L (ref 22–32)
Calcium: 9.1 mg/dL (ref 8.9–10.3)
Chloride: 106 mmol/L (ref 98–111)
Creatinine, Ser: 0.75 mg/dL (ref 0.61–1.24)
GFR, Estimated: >60 mL/min (ref >60)
Glucose, Bld: 76 mg/dL (ref 70–99)
Potassium: 3.9 mmol/L (ref 3.5–5.1)
Sodium: 141 mmol/L (ref 135–145)
Total Bilirubin: 1 mg/dL (ref 0.3–1.2)
Total Protein: 7.8 g/dL (ref 6.5–8.1)

## 2020-11-15 LAB — POC OCCULT BLOOD, ED: Fecal Occult Bld: NEGATIVE

## 2020-11-15 LAB — RESP PANEL BY RT-PCR (FLU A&B, COVID) ARPGX2
Influenza A by PCR: NEGATIVE
Influenza B by PCR: NEGATIVE
SARS Coronavirus 2 by RT PCR: NEGATIVE

## 2020-11-15 LAB — TROPONIN I (HIGH SENSITIVITY)
Troponin I (High Sensitivity): 5 ng/L
Troponin I (High Sensitivity): 4 ng/L (ref <18)

## 2020-11-15 LAB — BRAIN NATRIURETIC PEPTIDE: B Natriuretic Peptide: 22.3 pg/mL (ref 0.0–100.0)

## 2020-11-15 LAB — D-DIMER, QUANTITATIVE: D-Dimer, Quant: 1.06 ug{FEU}/mL — ABNORMAL HIGH (ref 0.00–0.50)

## 2020-11-15 MED ORDER — ENOXAPARIN SODIUM 40 MG/0.4ML IJ SOSY
40.0000 mg | PREFILLED_SYRINGE | INTRAMUSCULAR | Status: DC
  Administered 2020-11-15: 40 mg via SUBCUTANEOUS
  Filled 2020-11-15: qty 0.4

## 2020-11-15 MED ORDER — ONDANSETRON HCL 4 MG PO TABS: 4.0000 mg | ORAL_TABLET | Freq: Four times a day (QID) | ORAL | Status: DC | PRN

## 2020-11-15 MED ORDER — MORPHINE SULFATE (PF) 2 MG/ML IV SOLN: 2.0000 mg | INTRAVENOUS | Status: DC | PRN

## 2020-11-15 MED ORDER — SENNOSIDES-DOCUSATE SODIUM 8.6-50 MG PO TABS: 1.0000 | ORAL_TABLET | Freq: Every evening | ORAL | Status: DC | PRN

## 2020-11-15 MED ORDER — MAGNESIUM CITRATE PO SOLN: 1.0000 | Freq: Once | ORAL | Status: DC | PRN

## 2020-11-15 MED ORDER — BISACODYL 5 MG PO TBEC: 5.0000 mg | DELAYED_RELEASE_TABLET | Freq: Every day | ORAL | Status: DC | PRN

## 2020-11-15 MED ORDER — SODIUM CHLORIDE (PF) 0.9 % IJ SOLN
INTRAMUSCULAR | Status: AC
  Administered 2020-11-16: 3 mL via INTRAVENOUS
  Filled 2020-11-15: qty 50

## 2020-11-15 MED ORDER — ACETAMINOPHEN 325 MG PO TABS: 650.0000 mg | ORAL_TABLET | Freq: Four times a day (QID) | ORAL | Status: DC | PRN

## 2020-11-15 MED ORDER — SODIUM CHLORIDE 0.9% FLUSH: 3.0000 mL | Freq: Two times a day (BID) | INTRAVENOUS | Status: DC

## 2020-11-15 MED ORDER — ONDANSETRON HCL 4 MG/2ML IJ SOLN: 4.0000 mg | Freq: Four times a day (QID) | INTRAMUSCULAR | Status: DC | PRN

## 2020-11-15 MED ORDER — IOHEXOL 350 MG/ML SOLN
100.0000 mL | Freq: Once | INTRAVENOUS | Status: AC | PRN
  Administered 2020-11-15: 61 mL via INTRAVENOUS

## 2020-11-15 MED ORDER — ACETAMINOPHEN 650 MG RE SUPP
650.0000 mg | Freq: Four times a day (QID) | RECTAL | Status: DC | PRN
Start: 2020-11-15 — End: 2020-11-16

## 2020-11-15 MED ORDER — HYDROCODONE-ACETAMINOPHEN 5-325 MG PO TABS: 1.0000 | ORAL_TABLET | ORAL | Status: DC | PRN

## 2020-11-15 NOTE — ED Triage Notes (Signed)
Pt complains of bleeding from rectum. He noticed blood on his brief today. No n/v/d or abdominal pain. No changes in BM.

## 2020-11-15 NOTE — ED Provider Notes (Signed)
[facility] Note   CSN: [id number] Arrival date & time: 11/15/20  0759     History Chief Complaint  Patient presents with   Rectal Bleeding    Marcus Pace is a 65 y.o. male with a past medical history significant for chronic right inguinal hernia, history of PE not currently on any anticoagulants, and tobacco abuse who presents to the ED due to small amount of bright red blood coming from scrotum.  Patient believes he has a "scratch" on his scrotum and noticed a little bit of blood in his underwear. Denies current bleeding. Patient has a chronic inguinal hernia that goes into right scrotum for the past 2-3 years. NO change on size. Patient denies rectal bleeding. Denies difficulties urinating, hematuria, abdominal pain, nausea, and vomiting.  Denies testicular/scrotal pain.  Denies penile discharge.  Patient states he was referred to general surgery in the past however, due to financial restraints has been unable to have surgery.   Patient also endorses worsening shortness of breath for the past few months.  Patient states shortness of breath is present with exertion.  Patient was previously on Eliquis however, stopped a few months ago when he ran out of his prescription.  Patient denies history of DVT/PE however, per chart review he has a history of PE.  Denies recent surgeries, recent long immobilizations, hormonal treatments.  Denies lower extremity edema.  No associated chest pain. Patient denies orthopnea.  No fever or chills.  No cough.  Patient admits to tobacco use. No history of asthma or COPD.  No treatment prior to arrival.  History obtained from patient and past medical records. No interpreter used during encounter.       Past Medical History:  Diagnosis Date   Arthritis    Bacteremia    2016 or so with hospitalization   Inguinal hernia recurrent unilateral    herniation of mesentary and loops of bowel to right scrotal sac,  4.5 cm defect    Patient Active Problem List   Diagnosis Date Noted   Inguinal hernia, right 07/20/2019   Lesion of right lung 07/20/2019   Pulmonary abscess (HCC) 07/20/2019   Emphysema of lung (HCC) 07/20/2019   Bronchiectasis (HCC) 07/20/2019   Pneumonia due to organism 07/20/2019   Sigmoid thickening 07/20/2019   Pulmonary emboli (HCC) 07/20/2019   RLL pneumonia 07/20/2019   Malnutrition of moderate degree 08/03/2015   Influenza B 08/02/2015   Bacteremia due to Gram-positive bacteria 08/02/2015   Sepsis (HCC) 08/02/2015   Tobacco abuse 08/02/2015   Testicular pain, right 08/02/2015    History reviewed. No pertinent surgical history.     Family History  Problem Relation Age of Onset   Cancer Mother    Heart disease Father     Social History   Tobacco Use   Smoking status: Every Day    Packs/day: 1.00    Pack years: 0.00    Types: Cigarettes   Smokeless tobacco: Never  Substance Use Topics   Alcohol use: Yes   Drug use: Never    Home Medications Prior to Admission medications   Medication Sig Start Date End Date Taking? Authorizing Provider  apixaban (ELIQUIS) 5 MG TABS tablet Take 1 tablet (5 mg total) by mouth 2 (two) times daily. 08/25/19   [name], MD  feeding supplement, ENSURE ENLIVE, (ENSURE ENLIVE) LIQD Take 237 mLs by mouth daily. 08/04/15   Ghimire, Werner Lean, MD  gabapentin (NEURONTIN) 300 MG capsule Take 1 capsule (  300 mg total) by mouth 3 (three) times daily. 09/22/19   Raulkar, Drema Pry, MD  Menthol-Methyl Salicylate (MUSCLE RUB) 10-15 % CREA Apply 1 application topically as needed for muscle pain. 09/22/19   Raulkar, Drema Pry, MD  nicotine (NICODERM CQ - DOSED IN MG/24 HOURS) 14 mg/24hr patch Place 1 patch (14 mg total) onto the skin daily. 07/24/19   Pokhrel, Rebekah Chesterfield, MD  tiZANidine (ZANAFLEX) 4 MG tablet Take 1 tablet (4 mg total) by mouth every 8 (eight) hours as needed for muscle spasms. 08/25/19   [name], MD    Allergies     Patient has no known allergies.  Review of Systems   Review of Systems  Constitutional:  Negative for chills and fever.  Respiratory:  Positive for shortness of breath. Negative for cough.   Cardiovascular:  Negative for chest pain and leg swelling.  Gastrointestinal:  Negative for abdominal pain, diarrhea, nausea, rectal pain and vomiting.  Genitourinary:  Positive for scrotal swelling. Negative for difficulty urinating, dysuria, penile discharge and penile pain.   Physical Exam Updated Vital Signs BP 138/85   Pulse 69   Temp 97.7 F (36.5 C) (Oral)   Resp 16   SpO2 95%   Physical Exam Vitals and nursing note reviewed.  Constitutional:      General: He is not in acute distress.    Appearance: He is not ill-appearing.  HENT:     Head: Normocephalic.  Eyes:     Pupils: Pupils are equal, round, and reactive to light.  Cardiovascular:     Rate and Rhythm: Normal rate and regular rhythm.     Pulses: Normal pulses.     Heart sounds: Normal heart sounds. No murmur heard.   No friction rub. No gallop.  Pulmonary:     Effort: Pulmonary effort is normal.     Breath sounds: Normal breath sounds.  Abdominal:     General: Abdomen is flat. There is no distension.     Palpations: Abdomen is soft.     Tenderness: There is no abdominal tenderness. There is no guarding or rebound.     Comments: Abdomen soft, nondistended, nontender to palpation in all quadrants without guarding or peritoneal signs. No rebound.   Genitourinary:    Comments: GU exam performed with chaperone in room. Inguinal hernia present in right side of scrotum. No tenderness. No overlying erythema. No abrasions or bleeding from scrotum. No penile discharge.  Musculoskeletal:        General: Normal range of motion.     Cervical back: Neck supple.  Skin:    General: Skin is warm and dry.  Neurological:     General: No focal deficit present.     Mental Status: He is alert.  Psychiatric:        Mood and Affect:  Mood normal.        Behavior: Behavior normal.    ED Results / Procedures / Treatments   Labs (all labs ordered are listed, but only abnormal results are displayed) Labs Reviewed  D-DIMER, QUANTITATIVE - Abnormal; Notable for the following components:      Result Value   D-Dimer, Quant 1.06 (*)    All other components within normal limits  RESP PANEL BY RT-PCR (FLU A&B, COVID) ARPGX2  CBC WITH DIFFERENTIAL/PLATELET  COMPREHENSIVE METABOLIC PANEL  BRAIN NATRIURETIC PEPTIDE  POC OCCULT BLOOD, ED  TROPONIN I (HIGH SENSITIVITY)  TROPONIN I (HIGH SENSITIVITY)    EKG EKG Interpretation  Date/Time:  Wednesday November 15 2020 08:58:41 EDT Ventricular Rate:  70 PR Interval:  142 QRS Duration: 84 QT Interval:  406 QTC Calculation: 438 R Axis:   85 Text Interpretation: Normal sinus rhythm Possible Left atrial enlargement Septal infarct , age undetermined Abnormal ECG No significant change since last tracing Confirmed by Vanetta MuldersZackowski, Scott [phone]([id number]) on 11/15/2020 9:04:37 AM  Radiology CT Angio Chest PE W and/or Wo Contrast  Result Date: 11/15/2020 CLINICAL DATA:  Shortness of breath EXAM: CT ANGIOGRAPHY CHEST WITH CONTRAST TECHNIQUE: Multidetector CT imaging of the chest was performed using the standard protocol during bolus administration of intravenous contrast. Multiplanar CT image reconstructions and MIPs were obtained to evaluate the vascular anatomy. CONTRAST:  61mL OMNIPAQUE IOHEXOL 350 MG/ML SOLN COMPARISON:  March 2021 FINDINGS: Cardiovascular: Satisfactory opacification of the pulmonary arteries to the segmental level. No evidence of pulmonary embolism. Normal heart size. No pericardial effusion. Mediastinum/Nodes: No enlarged lymph nodes. Thyroid and esophagus are unremarkable. Lungs/Pleura: There is centrilobular and paraseptal emphysema. A cyst or bulla is present along the dependent right lower lobe possibly related to pneumonia in this region on the prior study. There is a 1 cm  irregular opacity in the right lower lobe (series 7, image 117). Small left pleural effusion. There is atelectasis or scarring along the peripheral left lower lobe and lingula; there is a more nodular area in this area (for example series 7, image 103). A 5 mm area of right paramediastinal nodular soft tissue on image 107 has increased in size (previously 3 mm). Upper Abdomen: No acute abnormality. Musculoskeletal: Degenerative changes of the included spine. There is moderate canal stenosis at T6-T7 secondary to calcification of ligamentum flavum. Review of the MIP images confirms the above findings. IMPRESSION: No evidence of acute pulmonary embolism. 1 cm right lower lobe irregular opacity. Increase in size of right paramediastinal 5 mm nodule. Small left pleural effusion. Atelectasis or scarring along the peripheral left lower lobe and lingula. A more nodular area in this area probably reflects atelectasis. Attention on follow-up is recommended. Emphysema (ICD10-J43.9). Recommend repeat chest CT in 3 months. Electronically Signed   By: Guadlupe SpanishPraneil  Patel M.D.   On: 11/15/2020 12:48   DG Chest Portable 1 View  Result Date: 11/15/2020 CLINICAL DATA:  65 year old male with history of shortness of breath. Rectal bleeding. EXAM: PORTABLE CHEST 1 VIEW COMPARISON:  Chest CT 08/24/2019. FINDINGS: Persistent nodule in the right mid lung measuring 11 mm. Lung volumes are increased with emphysematous changes. No acute consolidative airspace disease. Moderate left pleural effusion. Linear scarring in the left mid lung. No pneumothorax. No evidence of pulmonary edema. Heart size is normal. Upper mediastinal contours are within normal limits. IMPRESSION: 1. Moderate left pleural effusion. 2. New area of linear scarring in the left mid lung. 3. Persistent 11 mm pulmonary nodule in the right mid lung. Given the stability compared to the prior study, this is favored to be benign, however, continued attention on follow-up studies  is recommended with repeat standing PA lateral chest x-ray recommended in 6 months. Electronically Signed   By: Trudie Reedaniel  Entrikin M.D.   On: 11/15/2020 09:00    Procedures Procedures   Medications Ordered in ED Medications  sodium chloride (PF) 0.9 % injection (has no administration in time range)  iohexol (OMNIPAQUE) 350 MG/ML injection 100 mL (61 mLs Intravenous Contrast Given 11/15/20 1156)    ED Course  I have reviewed the triage vital signs and the nursing notes.  Pertinent labs & imaging results that were available during my care  of the patient were reviewed by me and considered in my medical decision making (see chart for details).  Clinical Course as of 11/15/20 1546  Wed Nov 15, 2020  0939 SpO2(!): 87 % [CA]  0955 D-Dimer, Quant(!): 1.06 [CA]    Clinical Course User Index [CA] Mannie Stabile, PA-C   MDM Rules/Calculators/A&P                         65 year old male presents to the ED due to mild scrotal bleeding that he noticed today in his underwear. NO current bleeding. He also endorses shortness of breath with exertion for the past few months.  He was previously on Eliquis for suspected PE per chart review; however, patient denies any history of PE/DVT. No infectious symptoms. He admits to chronic tobacco use.  Upon arrival, patient afebrile, not tachycardic or hypoxic.  Patient in no acute distress.  Abdomen soft, nondistended, nontender.  Large inguinal hernia into right side of scrotum. No tenderness, erythema, or abrasions.  Low suspicion for incarceration or strangulation.  No bleeding noted.  No external hemorrhoids.  No visible fissures.  Routine labs ordered.  Chest x-ray to rule out etiology of SOB. BNP to rule out CHF. D-dimer to rule out PE given possible history and not currently on any anticoagulants.   CXR demonstrates:   IMPRESSION:  1. Moderate left pleural effusion.  2. New area of linear scarring in the left mid lung.  3. Persistent 11 mm pulmonary  nodule in the right mid lung. Given  the stability compared to the prior study, this is favored to be  benign, however, continued attention on follow-up studies is  recommended with repeat standing PA lateral chest x-ray recommended  in 6 months.   CBC unremarkable no leukocytosis and normal hemoglobin.  CMP unremarkable with normal renal function no major electrolyte derangements.  BNP normal.  Doubt CHF.  Delta troponin flat.  Doubt ACS.  Fecal occult negative.  COVID/influenza negative.  D-dimer elevated.  CTA to rule out PE.  CTA chest personally reviewed which demonstrates: IMPRESSION:  No evidence of acute pulmonary embolism.     1 cm right lower lobe irregular opacity. Increase in size of right  paramediastinal 5 mm nodule.     Small left pleural effusion. Atelectasis or scarring along the  peripheral left lower lobe and lingula. A more nodular area in this  area probably reflects atelectasis. Attention on follow-up is  recommended.     Emphysema (ICD10-J43.9).   Given episode of hypoxia with ambulation, will consult hospitalist for admission.   Discussed with Dr. Ardyth Gal with [facility] who agrees to admit patient for further evaluation. COVID negative.  Final Clinical Impression(s) / ED Diagnoses Final diagnoses:  Shortness of breath    Rx / DC Orders ED Discharge Orders     None        Jesusita Oka 11/15/20 1546    Vanetta Mulders, MD 11/16/20 [phone]

## 2020-11-15 NOTE — H&P (Signed)
History and Physical    Marcus Pace ZOX:[id number] DOB: 02/26/1956 DOA: 11/15/2020  PCP: Cain Saupe, MD (Inactive)  Patient coming from: Home  I have personally briefly reviewed patient's old medical records in [facility].  Chief Complaint: Shortness of breath  HPI: Marcus Pace is a 65 y.o. male with medical history significant for history of PE 2021, emphysema (patient was unaware of PE and emphysema), lung nodule or lesion, smoker who presents to the emergency department on 11/15/20 with shortness of breath and conern about blood from his hernia. Regarding the shortness of breath: He has been short of breath more than usual. He started noticing shortness of breath several months ago but it has been worse lately. He becomes short of breath even with walking to the next room. He characterizes it as difficulty getting a good breath. Symptoms are alleviated by nothing and exacerbated by walking. Regarding his hernia: He has a right inguinal hernia that has been present for years, but on the day of presentation it started bleeding. He noticed some blood in underwear but did not find a specific open wound. He thinks he may have scratched the area. In the emergency department, the bleeding stopped and has not returned. No hematuria or dysuria. No pain in the hernia area. Sometimes has itching. Hernia symptoms resolved. Associated symptoms:Occasional cough. No wheezing. No fever or chills. No weight change. Left leg had cramping on the day before admission but it resolved. Patient denies history of COPD/asthma or heart disease.  ED Course: Patient was found to be hypoxic to 88% when he was ambulating. Chest imaging revealed emphysema, pulmonary nodule, and small pleural effusion.  Review of Systems: As per HPI otherwise all other systems reviewed and are unremarable.  GI: No abdominal pain, nausea, vomiting, diarrhea, constipation, or bloody stool.  Past Medical History:  Diagnosis  Date   Arthritis    Bacteremia    2016 or so with hospitalization   Emphysema of lung (HCC) 07/20/2019   Inguinal hernia recurrent unilateral    herniation of mesentary and loops of bowel to right scrotal sac, 4.5 cm defect   Lesion of right lung 07/20/2019   Pulmonary emboli (HCC) 07/20/2019    History reviewed. No pertinent surgical history.  Social History  reports that he has been smoking cigarettes. He has been smoking an average of 1 pack per day. He has never used smokeless tobacco. He reports current alcohol use. He reports that he does not use drugs.  No Known Allergies  Family History  Problem Relation Age of Onset   Cancer Mother    Heart disease Father      Home Medications  Prior to Admission medications   Medication Sig Start Date End Date Taking? Authorizing Provider  apixaban (ELIQUIS) 5 MG TABS tablet Take 1 tablet (5 mg total) by mouth 2 (two) times daily. Patient not taking: Reported on 11/15/2020 08/25/19   [name], MD  feeding supplement, ENSURE ENLIVE, (ENSURE ENLIVE) LIQD Take 237 mLs by mouth daily. Patient not taking: Reported on 11/15/2020 08/04/15   Maretta Bees, MD  gabapentin (NEURONTIN) 300 MG capsule Take 1 capsule (300 mg total) by mouth 3 (three) times daily. Patient not taking: Reported on 11/15/2020 09/22/19   Raulkar, Drema Pry, MD  Menthol-Methyl Salicylate (MUSCLE RUB) 10-15 % CREA Apply 1 application topically as needed for muscle pain. Patient not taking: Reported on 11/15/2020 09/22/19   Raulkar, Drema Pry, MD  nicotine (NICODERM CQ - DOSED IN  MG/24 HOURS) 14 mg/24hr patch Place 1 patch (14 mg total) onto the skin daily. Patient not taking: Reported on 11/15/2020 07/24/19   Pokhrel, Rebekah Chesterfield, MD  tiZANidine (ZANAFLEX) 4 MG tablet Take 1 tablet (4 mg total) by mouth every 8 (eight) hours as needed for muscle spasms. Patient not taking: Reported on 11/15/2020 08/25/19   [name], MD    Physical Exam: Vitals:   11/15/20 1600  11/15/20 1700 11/15/20 1736 11/15/20 1758  BP: 137/84 (!) 139/91  (!) 155/92  Pulse: 72 69  70  Resp: (!) 25 (!) 21  16  Temp:   98.6 F (37 C) 98.5 F (36.9 C)  TempSrc:   Oral Oral  SpO2: 92% 96%  97%    Constitutional: NAD, calm, comfortable, ill-appearing, thin. Vitals:   11/15/20 1600 11/15/20 1700 11/15/20 1736 11/15/20 1758  BP: 137/84 (!) 139/91  (!) 155/92  Pulse: 72 69  70  Resp: (!) 25 (!) 21  16  Temp:   98.6 F (37 C) 98.5 F (36.9 C)  TempSrc:   Oral Oral  SpO2: 92% 96%  97%   Eyes: Pupils equal and round, lids and conjunctivae without icterus or erythema. ENMT: Mucous membranes are dry. Posterior pharynx clear of any exudate or lesions. Nares patent without discharge or bleeding.  Normocephalic, atraumatic.  Normal dentition.  Neck: normal, supple, no masses, trachea midline.  Thyroid nontender, no masses appreciated, no thyromegaly. Respiratory: clear to auscultation bilaterally. Chest wall movements are symmetric. No crackles.  No rhonchi.  Normal respiratory effort. No accessory muscle use. Wheezing, scattered, bilaterally. Cardiovascular: Regular rate and rhythm, no murmurs / rubs / gallops. Pulses: DP pulses 2+ bilaterally. No carotid bruits.  Capillary refill less than 3 seconds. Edema: None bilaterally. No JVD. GI: soft, non-distended, normal active bowel sounds. No hepatosplenomegaly. No rigidity, rebound, or guarding. Non-tender. No masses palpated.  Musculoskeletal: no clubbing / cyanosis. No joint deformity upper and lower extremities. Good ROM, no contractures. Normal muscle tone.  No tenderness or deformity in the back bilaterally. Integument: no rashes, lesions, ulcers. No induration. Clean, dry, intact. Neurologic: CN 2-12 grossly intact. Sensation grossly intact to light touch. DTR 2+ bilaterally.  Babinski: Toes downgoing bilaterally.  Strength 5/5 in all 4.  Intact rapid alternating movements bilaterally.  No pronator drift. Psychiatric: Normal  judgment and insight. Alert and oriented x 3. Normal mood.  Normal and appropriate affect. Lymphatic: No cervical lymphadenopathy. No supraclavicular lymphadenopathy.   Labs on Admission: I have personally reviewed the following labs and imaging studies.  CBC: Recent Labs  Lab 11/15/20 0831  WBC 7.2  NEUTROABS 5.0  HGB 16.2  HCT 48.0  MCV 87.9  PLT 289    Basic Metabolic Panel: Recent Labs  Lab 11/15/20 0831  NA 141  K 3.9  CL 106  CO2 28  GLUCOSE 76  BUN 9  CREATININE 0.75  CALCIUM 9.1    GFR: CrCl cannot be calculated (Unknown ideal weight.).  Liver Function Tests: Recent Labs  Lab 11/15/20 0831  AST 30  ALT 25  ALKPHOS 89  BILITOT 1.0  PROT 7.8  ALBUMIN 3.9    Urine analysis:   Radiological Exams on Admission: CT Angio Chest PE W and/or Wo Contrast  Result Date: 11/15/2020 CLINICAL DATA:  Shortness of breath EXAM: CT ANGIOGRAPHY CHEST WITH CONTRAST TECHNIQUE: Multidetector CT imaging of the chest was performed using the standard protocol during bolus administration of intravenous contrast. Multiplanar CT image reconstructions and MIPs were obtained to evaluate  the vascular anatomy. CONTRAST:  61mL OMNIPAQUE IOHEXOL 350 MG/ML SOLN COMPARISON:  March 2021 FINDINGS: Cardiovascular: Satisfactory opacification of the pulmonary arteries to the segmental level. No evidence of pulmonary embolism. Normal heart size. No pericardial effusion. Mediastinum/Nodes: No enlarged lymph nodes. Thyroid and esophagus are unremarkable. Lungs/Pleura: There is centrilobular and paraseptal emphysema. A cyst or bulla is present along the dependent right lower lobe possibly related to pneumonia in this region on the prior study. There is a 1 cm irregular opacity in the right lower lobe (series 7, image 117). Small left pleural effusion. There is atelectasis or scarring along the peripheral left lower lobe and lingula; there is a more nodular area in this area (for example series 7, image  103). A 5 mm area of right paramediastinal nodular soft tissue on image 107 has increased in size (previously 3 mm). Upper Abdomen: No acute abnormality. Musculoskeletal: Degenerative changes of the included spine. There is moderate canal stenosis at T6-T7 secondary to calcification of ligamentum flavum. Review of the MIP images confirms the above findings. IMPRESSION: No evidence of acute pulmonary embolism. 1 cm right lower lobe irregular opacity. Increase in size of right paramediastinal 5 mm nodule. Small left pleural effusion. Atelectasis or scarring along the peripheral left lower lobe and lingula. A more nodular area in this area probably reflects atelectasis. Attention on follow-up is recommended. Emphysema (ICD10-J43.9). Recommend repeat chest CT in 3 months. Electronically Signed   By: Guadlupe SpanishPraneil  Patel M.D.   On: 11/15/2020 12:48   DG Chest Portable 1 View  Result Date: 11/15/2020 CLINICAL DATA:  65 year old male with history of shortness of breath. Rectal bleeding. EXAM: PORTABLE CHEST 1 VIEW COMPARISON:  Chest CT 08/24/2019. FINDINGS: Persistent nodule in the right mid lung measuring 11 mm. Lung volumes are increased with emphysematous changes. No acute consolidative airspace disease. Moderate left pleural effusion. Linear scarring in the left mid lung. No pneumothorax. No evidence of pulmonary edema. Heart size is normal. Upper mediastinal contours are within normal limits. IMPRESSION: 1. Moderate left pleural effusion. 2. New area of linear scarring in the left mid lung. 3. Persistent 11 mm pulmonary nodule in the right mid lung. Given the stability compared to the prior study, this is favored to be benign, however, continued attention on follow-up studies is recommended with repeat standing PA lateral chest x-ray recommended in 6 months. Electronically Signed   By: Trudie Reedaniel  Entrikin M.D.   On: 11/15/2020 09:00    EKG: Independently reviewed. 70 bpm. Normal sinus rhythm. Possible septal infarct age  undetermined. T wave inversion in V2.  Assessment/Plan Principal Problem:   Hypoxia Likely multifactorial and due to untreated COPD/emphysema, pleural effusion, or other cause. Plan: O2 by [place] and increase as needed. Treat COPD. Stop smoking.  Active Problems:    Emphysema of lung/ CPOD Probable diagnosis. Had been diagnosed previously but patient unaware. Not taking any medications. Plan: Start nebs and inhalers. RT consult. Hold off on steroids unless patient does not improve. Patient will need discharge planning assistance to be able to obtain medications and to set up a primary care referral.    Pleural effusion Small. Not large enough for a thoracentesis. Plan: Oxygen support. Monitor I/O.    Tobacco abuse Counseled to quit.    Lesion of right lung Had been present on previous imaging; discussed result with patient and advised him to choose a primary care doctor and follow up with her/him to arrange a repeat CT scan in several months.     Alcohol  use Usually drinks 3 cans of beer per day. Plan: Advised patient to cut down. Start thiamine and multivitamin. Patient advised to take thiamine daily after discharge.   DVT prophylaxis: Lovenox.  Code Status:   Full Code   Disposition Plan:   Patient is from:  Home  Anticipated DC to:  Home  Anticipated DC date:  11/16/20  Anticipated DC barriers: hypoxia  Consults called:  None  Admission status:  Observation   Severity of Illness: The appropriate patient status for this patient is OBSERVATION. Observation status is judged to be reasonable and necessary in order to provide the required intensity of service to ensure the patient's safety. The patient's presenting symptoms, physical exam findings, and initial radiographic and laboratory data in the context of their medical condition is felt to place them at decreased risk for further clinical deterioration. Furthermore, it is anticipated that the patient will be medically  stable for discharge from the hospital within 2 midnights of admission. The following factors support the patient status of observation.   " The patient's presenting symptoms include shortness of breath. " The physical exam findings include hypoxia. " The initial radiographic and laboratory data are notable for emphysema and pleural effusion.    Marlow Baars MD [facility]  How to contact the [facility] Attending or Consulting provider 7A - 7P or covering provider during after hours 7P -7A, for this patient?   Check the care team in [facility] and look for a) attending/consulting [facility] provider listed and b) the [facility] team listed Log into [email or web address] and use [place]'s universal password to access. If you do not have the password, please contact the hospital operator. Locate the [facility] provider you are looking for under [facility] and page to a number that you can be directly reached. If you still have difficulty reaching the provider, please page the [facility] (Director on Call) for the Hospitalists listed on amion for assistance.  11/15/2020, 7:31 PM

## 2020-11-15 NOTE — ED Notes (Signed)
Pt given sandwich and soda.  

## 2020-11-15 NOTE — Progress Notes (Signed)
Requesting updated temperature before accepting patient to unit.

## 2020-11-16 ENCOUNTER — Encounter (HOSPITAL_COMMUNITY): Payer: Self-pay | Admitting: Internal Medicine

## 2020-11-16 ENCOUNTER — Other Ambulatory Visit: Payer: Self-pay

## 2020-11-16 DIAGNOSIS — J441 Chronic obstructive pulmonary disease with (acute) exacerbation: Secondary | ICD-10-CM

## 2020-11-16 DIAGNOSIS — R911 Solitary pulmonary nodule: Secondary | ICD-10-CM

## 2020-11-16 DIAGNOSIS — Z720 Tobacco use: Secondary | ICD-10-CM

## 2020-11-16 LAB — CBC
HCT: 47.9 % (ref 39.0–52.0)
Hemoglobin: 16.3 g/dL (ref 13.0–17.0)
MCH: 29.8 pg (ref 26.0–34.0)
MCHC: 34 g/dL (ref 30.0–36.0)
MCV: 87.6 fL (ref 80.0–100.0)
Platelets: 293 10*3/uL (ref 150–400)
RBC: 5.47 MIL/uL (ref 4.22–5.81)
RDW: 13.7 % (ref 11.5–15.5)
WBC: 7.2 10*3/uL (ref 4.0–10.5)
nRBC: 0 % (ref 0.0–0.2)

## 2020-11-16 LAB — BASIC METABOLIC PANEL
Anion gap: 5 (ref 5–15)
BUN: 9 mg/dL (ref 8–23)
CO2: 29 mmol/L (ref 22–32)
Calcium: 9.1 mg/dL (ref 8.9–10.3)
Chloride: 104 mmol/L (ref 98–111)
Creatinine, Ser: 0.93 mg/dL (ref 0.61–1.24)
GFR, Estimated: >60 mL/min (ref >60)
Glucose, Bld: 97 mg/dL (ref 70–99)
Potassium: 3.8 mmol/L (ref 3.5–5.1)
Sodium: 138 mmol/L (ref 135–145)

## 2020-11-16 LAB — HIV ANTIBODY (ROUTINE TESTING W REFLEX): HIV Screen 4th Generation wRfx: NONREACTIVE

## 2020-11-16 MED ORDER — PREDNISONE 20 MG PO TABS
40.0000 mg | ORAL_TABLET | Freq: Every day | ORAL | 0 refills | Status: AC
  Filled 2020-11-16: qty 14, 7d supply, fill #0

## 2020-11-16 MED ORDER — THIAMINE HCL 100 MG PO TABS
100.0000 mg | ORAL_TABLET | Freq: Every day | ORAL | 0 refills | Status: AC
  Filled 2020-11-16: qty 30, 30d supply, fill #0

## 2020-11-16 MED ORDER — ADULT MULTIVITAMIN W/MINERALS CH
1.0000 | ORAL_TABLET | Freq: Every day | ORAL | 0 refills | Status: AC
  Filled 2020-11-16: qty 30, 30d supply, fill #0

## 2020-11-16 MED ORDER — ALBUTEROL SULFATE (2.5 MG/3ML) 0.083% IN NEBU: 2.5000 mg | INHALATION_SOLUTION | RESPIRATORY_TRACT | Status: DC | PRN

## 2020-11-16 MED ORDER — IPRATROPIUM-ALBUTEROL 0.5-2.5 (3) MG/3ML IN SOLN
3.0000 mL | Freq: Four times a day (QID) | RESPIRATORY_TRACT | Status: DC
  Administered 2020-11-16: 3 mL via RESPIRATORY_TRACT
  Filled 2020-11-16: qty 3

## 2020-11-16 MED ORDER — ADULT MULTIVITAMIN W/MINERALS CH
1.0000 | ORAL_TABLET | Freq: Every day | ORAL | Status: DC
  Administered 2020-11-16: 1 via ORAL
  Filled 2020-11-16: qty 1

## 2020-11-16 MED ORDER — MOMETASONE FURO-FORMOTEROL FUM 100-5 MCG/ACT IN AERO
2.0000 | INHALATION_SPRAY | Freq: Two times a day (BID) | RESPIRATORY_TRACT | 0 refills | Status: DC
  Filled 2020-11-16: qty 13, 30d supply, fill #0

## 2020-11-16 MED ORDER — MOMETASONE FURO-FORMOTEROL FUM 100-5 MCG/ACT IN AERO
2.0000 | INHALATION_SPRAY | Freq: Two times a day (BID) | RESPIRATORY_TRACT | Status: DC
  Administered 2020-11-16: 2 via RESPIRATORY_TRACT
  Filled 2020-11-16: qty 8.8

## 2020-11-16 MED ORDER — ALBUTEROL SULFATE HFA 108 (90 BASE) MCG/ACT IN AERS
2.0000 | INHALATION_SPRAY | RESPIRATORY_TRACT | 0 refills | Status: DC | PRN
  Filled 2020-11-16: qty 8, fill #0
  Filled 2020-11-16: qty 8.5, 16d supply, fill #0

## 2020-11-16 MED ORDER — THIAMINE HCL 100 MG PO TABS
100.0000 mg | ORAL_TABLET | Freq: Every day | ORAL | Status: DC
  Administered 2020-11-16: 100 mg via ORAL
  Filled 2020-11-16: qty 1

## 2020-11-16 NOTE — TOC Transition Note (Addendum)
Transition of Care [facility]) - CM/SW Discharge Note   Patient Details  Name: Marcus Pace MRN: [id number] Date of Birth: 01-08-1956  Transition of Care [facility]) CM/SW Contact:  Ida Rogue, LCSW Phone Number: 11/16/2020, 10:44 AM   Clinical Narrative:   Patient in need of PCP has an appointment at [facility] for earliest available in mid September.  No further needs identified. TOC sign off.  Addendum: Patient will take hospital inhaler with him at d/c, which addresses current medication affordability issue. Recommend smoking cessation.    Final next level of care: Home/Self Care Barriers to Discharge: No Barriers Identified   Patient Goals and CMS Choice        Discharge Placement                       Discharge Plan and Services                                     Social Determinants of Health (SDOH) Interventions     Readmission Risk Interventions Readmission Risk Prevention Plan 07/22/2019  Post Dischage Appt Complete  Medication Screening Complete  Transportation Screening Complete  Some recent data might be hidden

## 2020-11-16 NOTE — Discharge Summary (Signed)
Physician Discharge Summary  Marcus Pace MHD:[id number] DOB: 25-Feb-1956 DOA: 11/15/2020  PCP: Cain Saupe, MD (Inactive)  Admit date: 11/15/2020 Discharge date: 11/16/2020  Admitted From: Home Disposition: Home  Recommendations for Outpatient Follow-up:  Follow up with PCP in 1 week with repeat CBC/BMP Outpatient evaluation and follow-up by pulmonary Follow up in ED if symptoms worsen or new appear   Home Health: No Equipment/Devices: None  Discharge Condition: Stable CODE STATUS: Full Diet recommendation: Heart healthy  Brief/Interim Summary: 65 year old male with history of PE in 2021, emphysema (patient apparently unaware of PE and emphysema), lung nodule/lesion, tobacco use presented with worsening shortness of breath.  On presentation, he was hypoxic to 88% on ambulation.  Chest x-ray showed emphysema, pulmonary nodule and small pleural effusion.  CTA of chest was negative for PE but showed right lower lobe irregular opacity.  His respiratory status has improved.  He is currently on room air.  He wants to go home.  He will be discharged home today with outpatient follow-up with PCP and pulmonary.  Discharge Diagnoses:   COPD with possible exacerbation Hypoxia Tobacco abuse -Patient was hypoxic to 88% on ambulation.  Currently on room air and feels much better. -Chest x-ray was negative for PE but showed emphysema. -Patient was to go home.  Discharge patient on [facility] along with as needed albuterol.  Prednisone 40 mg daily for 7 days.  Outpatient evaluation and follow-up with pulmonary. -Counseled regarding tobacco cessation  Right lung lesion -CT chest shows right lower lobe irregular opacity: Recommend outpatient follow-up with repeat CT in 3 months.  Outpatient evaluation and follow-up by pulmonary  Alcohol abuse -No signs of withdrawal.  Counseled regarding abstinence from alcohol.  Continue multivitamin, thiamine on discharge  Discharge Instructions  Discharge  Instructions     Ambulatory referral to Pulmonology   Complete by: As directed    Hospital followup for COPD/Lung nodule   Reason for referral: Asthma/COPD   Diet - low sodium heart healthy   Complete by: As directed    Increase activity slowly   Complete by: As directed       Allergies as of 11/16/2020   No Known Allergies      Medication List     STOP taking these medications    apixaban 5 MG Tabs tablet Commonly known as: ELIQUIS   feeding supplement Liqd   gabapentin 300 MG capsule Commonly known as: Neurontin   Muscle Rub 10-15 % Crea   nicotine 14 mg/24hr patch Commonly known as: NICODERM CQ - dosed in mg/24 hours   tiZANidine 4 MG tablet Commonly known as: Zanaflex       TAKE these medications    albuterol 108 (90 Base) MCG/ACT inhaler Commonly known as: VENTOLIN HFA Inhale 2 puffs into the lungs every 4 (four) hours as needed for wheezing or shortness of breath.   mometasone-formoterol 100-5 MCG/ACT Aero Commonly known as: DULERA Inhale 2 puffs into the lungs 2 (two) times daily.   multivitamin with minerals Tabs tablet Take 1 tablet by mouth daily.   predniSONE 20 MG tablet Commonly known as: DELTASONE Take 2 tablets (40 mg total) by mouth daily with breakfast for 7 days.   thiamine 100 MG tablet Take 1 tablet (100 mg total) by mouth daily.        Follow-up Information     [facility]. Go on 01/18/2021.   Specialty: Internal Medicine Why: Thursday at 2:15 for your hospital follow up appointment Contact information: [phone] [place] [address] [phone]               No Known Allergies  Consultations: None   Procedures/Studies: CT Angio Chest PE W and/or Wo Contrast  Result Date: 11/15/2020 CLINICAL DATA:  Shortness of breath EXAM: CT ANGIOGRAPHY CHEST WITH CONTRAST TECHNIQUE: Multidetector CT imaging of the chest was performed using the standard protocol during bolus  administration of intravenous contrast. Multiplanar CT image reconstructions and MIPs were obtained to evaluate the vascular anatomy. CONTRAST:  50mL OMNIPAQUE IOHEXOL 350 MG/ML SOLN COMPARISON:  March 2021 FINDINGS: Cardiovascular: Satisfactory opacification of the pulmonary arteries to the segmental level. No evidence of pulmonary embolism. Normal heart size. No pericardial effusion. Mediastinum/Nodes: No enlarged lymph nodes. Thyroid and esophagus are unremarkable. Lungs/Pleura: There is centrilobular and paraseptal emphysema. A cyst or bulla is present along the dependent right lower lobe possibly related to pneumonia in this region on the prior study. There is a 1 cm irregular opacity in the right lower lobe (series 7, image 117). Small left pleural effusion. There is atelectasis or scarring along the peripheral left lower lobe and lingula; there is a more nodular area in this area (for example series 7, image 103). A 5 mm area of right paramediastinal nodular soft tissue on image 107 has increased in size (previously 3 mm). Upper Abdomen: No acute abnormality. Musculoskeletal: Degenerative changes of the included spine. There is moderate canal stenosis at T6-T7 secondary to calcification of ligamentum flavum. Review of the MIP images confirms the above findings. IMPRESSION: No evidence of acute pulmonary embolism. 1 cm right lower lobe irregular opacity. Increase in size of right paramediastinal 5 mm nodule. Small left pleural effusion. Atelectasis or scarring along the peripheral left lower lobe and lingula. A more nodular area in this area probably reflects atelectasis. Attention on follow-up is recommended. Emphysema (ICD10-J43.9). Recommend repeat chest CT in 3 months. Electronically Signed   By: Guadlupe Spanish M.D.   On: 11/15/2020 12:48   DG Chest Portable 1 View  Result Date: 11/15/2020 CLINICAL DATA:  65 year old male with history of shortness of breath. Rectal bleeding. EXAM: PORTABLE CHEST 1 VIEW  COMPARISON:  Chest CT 08/24/2019. FINDINGS: Persistent nodule in the right mid lung measuring 11 mm. Lung volumes are increased with emphysematous changes. No acute consolidative airspace disease. Moderate left pleural effusion. Linear scarring in the left mid lung. No pneumothorax. No evidence of pulmonary edema. Heart size is normal. Upper mediastinal contours are within normal limits. IMPRESSION: 1. Moderate left pleural effusion. 2. New area of linear scarring in the left mid lung. 3. Persistent 11 mm pulmonary nodule in the right mid lung. Given the stability compared to the prior study, this is favored to be benign, however, continued attention on follow-up studies is recommended with repeat standing PA lateral chest x-ray recommended in 6 months. Electronically Signed   By: Trudie Reed M.D.   On: 11/15/2020 09:00      Subjective: Patient seen and examined at bedside.  Feels much better and wants to go home today.  No overnight fever, vomiting or worsening shortness of breath reported.  Discharge Exam: Vitals:   11/16/20 1128 11/16/20 1131  BP:    Pulse:    Resp:    Temp:    SpO2: 97% 97%    General: Pt is alert, awake, not in acute distress.  Currently on room air Cardiovascular: rate controlled, S1/S2 + Respiratory: bilateral decreased breath sounds at bases with some scattered crackles  Abdominal: Soft, NT, ND, bowel sounds + Extremities: no edema, no cyanosis    The results of significant diagnostics from this hospitalization (including imaging, microbiology, ancillary and laboratory) are listed below for reference.     Microbiology: Recent Results (from the past 240 hour(s))  Resp Panel by RT-PCR (Flu A&B, Covid) Nasopharyngeal Swab     Status: None   Collection Time: 11/15/20  9:17 AM   Specimen: Nasopharyngeal Swab; Nasopharyngeal(NP) swabs in vial transport medium  Result Value Ref Range Status   SARS Coronavirus 2 by RT PCR NEGATIVE NEGATIVE Final    Comment:  (NOTE) SARS-CoV-2 target nucleic acids are NOT DETECTED.  The SARS-CoV-2 RNA is generally detectable in upper respiratory specimens during the acute phase of infection. The lowest concentration of SARS-CoV-2 viral copies this assay can detect is 138 copies/mL. A negative result does not preclude SARS-Cov-2 infection and should not be used as the sole basis for treatment or other patient management decisions. A negative result may occur with  improper specimen collection/handling, submission of specimen other than nasopharyngeal swab, presence of viral mutation(s) within the areas targeted by this assay, and inadequate number of viral copies(<138 copies/mL). A negative result must be combined with clinical observations, patient history, and epidemiological information. The expected result is Negative.  Fact Sheet for Patients:  BloggerCourse.com  Fact Sheet for Healthcare Providers:  SeriousBroker.it  This test is no t yet approved or cleared by the [place] FDA and  has been authorized for detection and/or diagnosis of SARS-CoV-2 by FDA under an Emergency Use Authorization (EUA). This EUA will remain  in effect (meaning this test can be used) for the duration of the COVID-19 declaration under Section 564(b)(1) of the Act, 21 U.S.C.section 360bbb-3(b)(1), unless the authorization is terminated  or revoked sooner.       Influenza A by PCR NEGATIVE NEGATIVE Final   Influenza B by PCR NEGATIVE NEGATIVE Final    Comment: (NOTE) The Xpert Xpress SARS-CoV-2/FLU/RSV plus assay is intended as an aid in the diagnosis of influenza from Nasopharyngeal swab specimens and should not be used as a sole basis for treatment. Nasal washings and aspirates are unacceptable for Xpert Xpress SARS-CoV-2/FLU/RSV testing.  Fact Sheet for Patients: BloggerCourse.com  Fact Sheet for Healthcare  Providers: SeriousBroker.it  This test is not yet approved or cleared by the [place] FDA and has been authorized for detection and/or diagnosis of SARS-CoV-2 by FDA under an Emergency Use Authorization (EUA). This EUA will remain in effect (meaning this test can be used) for the duration of the COVID-19 declaration under Section 564(b)(1) of the Act, 21 U.S.C. section 360bbb-3(b)(1), unless the authorization is terminated or revoked.  Performed at [facility], [address] [place], [place] [address]      Labs: BNP (last 3 results) Recent Labs    11/15/20 0831  BNP 22.3   Basic Metabolic Panel: Recent Labs  Lab 11/15/20 0831 11/16/20 0502  NA 141 138  K 3.9 3.8  CL 106 104  CO2 28 29  GLUCOSE 76 97  BUN 9 9  CREATININE 0.75 0.93  CALCIUM 9.1 9.1   Liver Function Tests: Recent Labs  Lab 11/15/20 0831  AST 30  ALT 25  ALKPHOS 89  BILITOT 1.0  PROT 7.8  ALBUMIN 3.9   No results for input(s): LIPASE, AMYLASE in the last 168 hours. No results for input(s): AMMONIA in the last 168 hours. CBC: Recent Labs  Lab 11/15/20 0831 11/16/20 0502  WBC  7.2 7.2  NEUTROABS 5.0  --   HGB 16.2 16.3  HCT 48.0 47.9  MCV 87.9 87.6  PLT 289 293   Cardiac Enzymes: No results for input(s): CKTOTAL, CKMB, CKMBINDEX, TROPONINI in the last 168 hours. BNP: Invalid input(s): POCBNP CBG: No results for input(s): GLUCAP in the last 168 hours. D-Dimer Recent Labs    11/15/20 0831  DDIMER 1.06*   Hgb A1c No results for input(s): HGBA1C in the last 72 hours. Lipid Profile No results for input(s): CHOL, HDL, LDLCALC, TRIG, CHOLHDL, LDLDIRECT in the last 72 hours. Thyroid function studies No results for input(s): TSH, T4TOTAL, T3FREE, THYROIDAB in the last 72 hours.  Invalid input(s): FREET3 Anemia work up No results for input(s): VITAMINB12, FOLATE, FERRITIN, TIBC, IRON, RETICCTPCT in the last 72 hours. Urinalysis     Component Value Date/Time   COLORURINE YELLOW 07/20/2019 1555   APPEARANCEUR CLEAR 07/20/2019 1555   LABSPEC 1.013 07/20/2019 1555   PHURINE 7.0 07/20/2019 1555   GLUCOSEU NEGATIVE 07/20/2019 1555   HGBUR NEGATIVE 07/20/2019 1555   BILIRUBINUR NEGATIVE 07/20/2019 1555   KETONESUR NEGATIVE 07/20/2019 1555   PROTEINUR NEGATIVE 07/20/2019 1555   NITRITE NEGATIVE 07/20/2019 1555   LEUKOCYTESUR NEGATIVE 07/20/2019 1555   Sepsis Labs Invalid input(s): PROCALCITONIN,  WBC,  LACTICIDVEN Microbiology Recent Results (from the past 240 hour(s))  Resp Panel by RT-PCR (Flu A&B, Covid) Nasopharyngeal Swab     Status: None   Collection Time: 11/15/20  9:17 AM   Specimen: Nasopharyngeal Swab; Nasopharyngeal(NP) swabs in vial transport medium  Result Value Ref Range Status   SARS Coronavirus 2 by RT PCR NEGATIVE NEGATIVE Final    Comment: (NOTE) SARS-CoV-2 target nucleic acids are NOT DETECTED.  The SARS-CoV-2 RNA is generally detectable in upper respiratory specimens during the acute phase of infection. The lowest concentration of SARS-CoV-2 viral copies this assay can detect is 138 copies/mL. A negative result does not preclude SARS-Cov-2 infection and should not be used as the sole basis for treatment or other patient management decisions. A negative result may occur with  improper specimen collection/handling, submission of specimen other than nasopharyngeal swab, presence of viral mutation(s) within the areas targeted by this assay, and inadequate number of viral copies(<138 copies/mL). A negative result must be combined with clinical observations, patient history, and epidemiological information. The expected result is Negative.  Fact Sheet for Patients:  BloggerCourse.comhttps://www.fda.gov/media/152166/download  Fact Sheet for Healthcare Providers:  SeriousBroker.ithttps://www.fda.gov/media/152162/download  This test is no t yet approved or cleared by the [place] FDA and  has been authorized for detection  and/or diagnosis of SARS-CoV-2 by FDA under an Emergency Use Authorization (EUA). This EUA will remain  in effect (meaning this test can be used) for the duration of the COVID-19 declaration under Section 564(b)(1) of the Act, 21 U.S.C.section 360bbb-3(b)(1), unless the authorization is terminated  or revoked sooner.       Influenza A by PCR NEGATIVE NEGATIVE Final   Influenza B by PCR NEGATIVE NEGATIVE Final    Comment: (NOTE) The Xpert Xpress SARS-CoV-2/FLU/RSV plus assay is intended as an aid in the diagnosis of influenza from Nasopharyngeal swab specimens and should not be used as a sole basis for treatment. Nasal washings and aspirates are unacceptable for Xpert Xpress SARS-CoV-2/FLU/RSV testing.  Fact Sheet for Patients: BloggerCourse.comhttps://www.fda.gov/media/152166/download  Fact Sheet for Healthcare Providers: SeriousBroker.ithttps://www.fda.gov/media/152162/download  This test is not yet approved or cleared by the [place] FDA and has been authorized for detection and/or diagnosis of SARS-CoV-2 by FDA under an  Emergency Use Authorization (EUA). This EUA will remain in effect (meaning this test can be used) for the duration of the COVID-19 declaration under Section 564(b)(1) of the Act, 21 U.S.C. section 360bbb-3(b)(1), unless the authorization is terminated or revoked.  Performed at [facility], [address], [place], [place] [address]      Time coordinating discharge: 35 minutes  SIGNED:   Glade Lloyd, MD  [facility] 11/16/2020, 11:47 AM

## 2020-11-17 ENCOUNTER — Telehealth: Payer: Self-pay

## 2020-11-17 NOTE — Telephone Encounter (Signed)
Transition Care Management Unsuccessful Follow-up Telephone Call  Date of discharge and from where:  [facility]  Attempts:  1st Attempt  Reason for unsuccessful TCM follow-up call:  Left voice message on both listed phone numbers.   Pt need schedule HFU appt with PCP.

## 2020-11-20 ENCOUNTER — Telehealth: Payer: Self-pay

## 2020-11-20 NOTE — Telephone Encounter (Signed)
Transition Care Management Unsuccessful Follow-up Telephone Call  Date of discharge and from where:  11/16/2020, [facility]   Attempts:  2nd Attempt  Reason for unsuccessful TCM follow-up call:  Left voice message on # [phone]. Call placed to # [phone], message left with his sister, Wilnette Kales requesting he return the call to this CM. She said he was not home at this time.

## 2020-11-21 ENCOUNTER — Telehealth: Payer: Self-pay

## 2020-11-21 ENCOUNTER — Other Ambulatory Visit: Payer: Self-pay

## 2020-11-21 NOTE — Telephone Encounter (Signed)
Transition Care Management Unsuccessful Follow-up Telephone Call  Date of discharge and from where:  11/16/2020, [facility]  Attempts:  3rd Attempt  Reason for unsuccessful TCM follow-up call:  Left voice message on # [phone]. Cal back requested.   Letter sent to patient requesting he contact the clinic to schedule a hospital follow up appointment.   [facility]  - 01/18/2021

## 2020-11-29 ENCOUNTER — Ambulatory Visit: Payer: Self-pay | Attending: Family Medicine | Admitting: Family Medicine

## 2020-11-29 ENCOUNTER — Other Ambulatory Visit: Payer: Self-pay

## 2020-11-29 ENCOUNTER — Encounter: Payer: Self-pay | Admitting: Family Medicine

## 2020-11-29 VITALS — BP 120/72 | HR 73 | Ht 73.0 in | Wt 131.6 lb

## 2020-11-29 DIAGNOSIS — Z720 Tobacco use: Secondary | ICD-10-CM

## 2020-11-29 DIAGNOSIS — R918 Other nonspecific abnormal finding of lung field: Secondary | ICD-10-CM

## 2020-11-29 DIAGNOSIS — R399 Unspecified symptoms and signs involving the genitourinary system: Secondary | ICD-10-CM

## 2020-11-29 DIAGNOSIS — J439 Emphysema, unspecified: Secondary | ICD-10-CM

## 2020-11-29 DIAGNOSIS — G4709 Other insomnia: Secondary | ICD-10-CM

## 2020-11-29 MED ORDER — ALBUTEROL SULFATE HFA 108 (90 BASE) MCG/ACT IN AERS
2.0000 | INHALATION_SPRAY | RESPIRATORY_TRACT | 6 refills | Status: DC | PRN
  Filled 2020-11-29: qty 8.5, 16d supply, fill #0
  Filled 2021-02-15: qty 8.5, 16d supply, fill #1
  Filled 2021-06-11: qty 8.5, 16d supply, fill #0

## 2020-11-29 MED ORDER — BUPROPION HCL ER (XL) 150 MG PO TB24
150.0000 mg | ORAL_TABLET | Freq: Every day | ORAL | 6 refills | Status: DC
  Filled 2020-11-29: qty 30, 30d supply, fill #0

## 2020-11-29 MED ORDER — TAMSULOSIN HCL 0.4 MG PO CAPS
0.4000 mg | ORAL_CAPSULE | Freq: Every day | ORAL | 6 refills | Status: DC
  Filled 2020-11-29: qty 30, 30d supply, fill #0

## 2020-11-29 MED ORDER — MELATONIN 3 MG PO TABS
3.0000 mg | ORAL_TABLET | Freq: Every day | ORAL | 3 refills | Status: DC
  Filled 2020-11-29: qty 30, 30d supply, fill #0

## 2020-11-29 MED ORDER — MOMETASONE FURO-FORMOTEROL FUM 100-5 MCG/ACT IN AERO
2.0000 | INHALATION_SPRAY | Freq: Two times a day (BID) | RESPIRATORY_TRACT | 6 refills | Status: DC
  Filled 2020-11-29 – 2021-06-11 (×3): qty 13, 30d supply, fill #0

## 2020-11-29 NOTE — Progress Notes (Signed)
Not sleeping 

## 2020-11-29 NOTE — Progress Notes (Signed)
Subjective:  Patient ID: Marcus Pace, male    DOB: November 03, 1955  Age: 65 y.o. MRN: [id number]  CC: Hospitalization Follow-up   HPI Marcus Pace is a 65-year-old male with a history of Emphysema, previous PE in (07/2019 completed anticoagulation), chronic low back pain hospitalized 11/15/2020 through 11/16/2020 emphysema exacerbation after he had presented with dyspnea and an oxygen saturation of 88%. CTA chest revealed:   IMPRESSION: No evidence of acute pulmonary embolism.   1 cm right lower lobe irregular opacity. Increase in size of right paramediastinal 5 mm nodule.   Small left pleural effusion. Atelectasis or scarring along the peripheral left lower lobe and lingula. A more nodular area in this area probably reflects atelectasis. Attention on follow-up is recommended.   Emphysema (ICD10-J43.9).   Recommend repeat chest CT in 3 months.   He was placed on Dulera and Albuterol MDI and subsequently discharged.  Interval History: States his breathing is a lot better and he is compliant with his inhalers. Smoking 2-3 cig/day and he vapes as well.  Complains of insomnia for the last 1 month. Endorses drinking 1 can of Red Bull daily. Goes to bed between 9pm -12 midnight and wakes up at 4 am. He wakes up to use the bathroom then tosses and turns and cannot get back to sleep.  He does not take daytime naps.  Denies nocturia or urinary frequency but does have hesitancy and straining. Past Medical History:  Diagnosis Date   Arthritis    Bacteremia    2016 or so with hospitalization   Emphysema of lung (HCC) 07/20/2019   Inguinal hernia recurrent unilateral    herniation of mesentary and loops of bowel to right scrotal sac, 4.5 cm defect   Lesion of right lung 07/20/2019   Pulmonary emboli (HCC) 07/20/2019    No past surgical history on file.  Family History  Problem Relation Age of Onset   Cancer Mother    Heart disease Father     No Known Allergies  Outpatient  Medications Prior to Visit  Medication Sig Dispense Refill   Multiple Vitamin (MULTIVITAMIN WITH MINERALS) TABS tablet Take 1 tablet by mouth daily. 30 tablet 0   thiamine 100 MG tablet Take 1 tablet (100 mg total) by mouth daily. 30 tablet 0   albuterol (VENTOLIN HFA) 108 (90 Base) MCG/ACT inhaler Inhale 2 puffs into the lungs every 4 (four) hours as needed for wheezing or shortness of breath. 8.5 g 0   mometasone-formoterol (DULERA) 100-5 MCG/ACT AERO Inhale 2 puffs into the lungs 2 (two) times daily. 13 g 0   No facility-administered medications prior to visit.     ROS Review of Systems  Constitutional:  Negative for activity change and appetite change.  HENT:  Negative for sinus pressure and sore throat.   Eyes:  Negative for visual disturbance.  Respiratory:  Negative for cough, chest tightness and shortness of breath.   Cardiovascular:  Negative for chest pain and leg swelling.  Gastrointestinal:  Negative for abdominal distention, abdominal pain, constipation and diarrhea.  Endocrine: Negative.   Genitourinary:  Negative for dysuria.  Musculoskeletal:  Negative for joint swelling and myalgias.  Skin:  Negative for rash.  Allergic/Immunologic: Negative.   Neurological:  Negative for weakness, light-headedness and numbness.  Psychiatric/Behavioral:  Positive for sleep disturbance. Negative for dysphoric mood.    Objective:  BP 120/72   Pulse 73   Ht 6\' 1"  (1.854 m)   Wt 131 lb 9.6 oz (59.7 kg)  SpO2 97%   BMI 17.36 kg/m   BP/Weight 11/29/2020 11/16/2020 09/22/2019  Systolic BP 120 135 120  Diastolic BP 72 90 81  Wt. (Lbs) 131.6 130.95 137.6  BMI 17.36 17.76 18.66      Physical Exam Constitutional:      Appearance: He is well-developed.  Cardiovascular:     Rate and Rhythm: Normal rate.     Heart sounds: Normal heart sounds. No murmur heard. Pulmonary:     Effort: Pulmonary effort is normal.     Breath sounds: Normal breath sounds. No wheezing or rales.  Chest:      Chest wall: No tenderness.  Abdominal:     General: Bowel sounds are normal. There is no distension.     Palpations: Abdomen is soft. There is no mass.     Tenderness: There is no abdominal tenderness.  Musculoskeletal:        General: Normal range of motion.     Right lower leg: No edema.     Left lower leg: No edema.  Neurological:     Mental Status: He is alert and oriented to person, place, and time.  Psychiatric:        Mood and Affect: Mood normal.    CMP Latest Ref Rng & Units 11/16/2020 11/15/2020 08/25/2019  Glucose 70 - 99 mg/dL 97 76 79  BUN 8 - 23 mg/dL 9 9 12   Creatinine 0.61 - 1.24 mg/dL 8.18 2.99  Sodium 135 - 145 mmol/L 138 141 140  Potassium 3.5 - 5.1 mmol/L 3.8 3.9 4.7  Chloride 98 - 111 mmol/L 104 106 99  CO2 22 - 32 mmol/L 29 28 25   Calcium 8.9 - 10.3 mg/dL 9.1 9.1 9.5  Total Protein 6.5 - 8.1 g/dL - 7.8 7.6  Total Bilirubin 0.3 - 1.2 mg/dL - 1.0 1.0  Alkaline Phos 38 - 126 U/L - 89 84  AST 15 - 41 U/L - 30 36  ALT 0 - 44 U/L - 25 35    Lipid Panel  No results found for: CHOL, TRIG, HDL, CHOLHDL, VLDL, LDLCALC, LDLDIRECT  CBC    Component Value Date/Time   WBC 7.2 11/16/2020 0502   RBC 5.47 11/16/2020 0502   HGB 16.3 11/16/2020 0502   HGB 14.4 07/28/2019 1116   HCT 47.9 11/16/2020 0502   HCT 43.6 07/28/2019 1116   PLT 293 11/16/2020 0502   PLT 588 (H) 07/28/2019 1116   MCV 87.6 11/16/2020 0502   MCV 85 07/28/2019 1116   MCH 29.8 11/16/2020 0502   MCHC 34.0 11/16/2020 0502   RDW 13.7 11/16/2020 0502   RDW 13.1 07/28/2019 1116   LYMPHSABS 1.0 11/15/2020 0831   LYMPHSABS 1.1 07/28/2019 1116   MONOABS 0.8 11/15/2020 0831   EOSABS 0.3 11/15/2020 0831   EOSABS 0.5 (H) 07/28/2019 1116   BASOSABS 0.1 11/15/2020 0831   BASOSABS 0.1 07/28/2019 1116    No results found for: HGBA1C  Assessment & Plan:  1. Other insomnia Uncontrolled Discussed sleep hygiene Advised to discontinue daily intake of red bull - melatonin 3 MG TABS tablet;  Take 1 tablet (3 mg total) by mouth at bedtime.  Dispense: 30 tablet; Refill: 3  2. Pulmonary emphysema, unspecified emphysema type (HCC) Stable Smoking cessation has been encouraged - mometasone-formoterol (DULERA) 100-5 MCG/ACT AERO; Inhale 2 puffs into the lungs 2 (two) times daily.  Dispense: 13 g; Refill: 6 - albuterol (VENTOLIN HFA) 108 (90 Base) MCG/ACT inhaler; Inhale 2 puffs into the lungs every 4 (  four) hours as needed for wheezing or shortness of breath.  Dispense: 8.5 g; Refill: 6  3. Tobacco abuse Spent 3 minutes counseling on smoking cessation and he is willing to work on quitting - buPROPion (WELLBUTRIN XL) 150 MG 24 hr tablet; Take 1 tablet (150 mg total) by mouth daily. For smoking cessation  Dispense: 30 tablet; Refill: 6  4. Lower urinary tract symptoms This could be contributing to insomnia as well - tamsulosin (FLOMAX) 0.4 MG CAPS capsule; Take 1 capsule (0.4 mg total) by mouth daily.  Dispense: 30 capsule; Refill: 6 - PSA, total and free  5. Lung mass Right lower lobe opacity on CT chest Recommended follow-up in 3 months   Meds ordered this encounter  Medications   mometasone-formoterol (DULERA) 100-5 MCG/ACT AERO    Sig: Inhale 2 puffs into the lungs 2 (two) times daily.    Dispense:  13 g    Refill:  6   albuterol (VENTOLIN HFA) 108 (90 Base) MCG/ACT inhaler    Sig: Inhale 2 puffs into the lungs every 4 (four) hours as needed for wheezing or shortness of breath.    Dispense:  8.5 g    Refill:  6   buPROPion (WELLBUTRIN XL) 150 MG 24 hr tablet    Sig: Take 1 tablet (150 mg total) by mouth daily. For smoking cessation    Dispense:  30 tablet    Refill:  6   tamsulosin (FLOMAX) 0.4 MG CAPS capsule    Sig: Take 1 capsule (0.4 mg total) by mouth daily.    Dispense:  30 capsule    Refill:  6   melatonin 3 MG TABS tablet    Sig: Take 1 tablet (3 mg total) by mouth at bedtime.    Dispense:  30 tablet    Refill:  3    Follow-up: Return in about 3 months  (around 03/01/2021) for Emphysema management.       [name], MD, FAAFP. [facility] and [facility] [place], [place] [phone]   11/29/2020, 11:59 AM

## 2020-11-29 NOTE — Patient Instructions (Signed)
Insomnia Insomnia is a sleep disorder that makes it difficult to fall asleep or stay asleep. Insomnia can cause fatigue, low energy, difficulty concentrating, moodswings, and poor performance at work or school. There are three different ways to classify insomnia: Difficulty falling asleep. Difficulty staying asleep. Waking up too early in the morning. Any type of insomnia can be long-term (chronic) or short-term (acute). Both are common. Short-term insomnia usually lasts for three months or less. Chronic insomnia occurs at least three times a week for longer than threemonths. What are the causes? Insomnia may be caused by another condition, situation, or substance, such as: Anxiety. Certain medicines. Gastroesophageal reflux disease (GERD) or other gastrointestinal conditions. Asthma or other breathing conditions. Restless legs syndrome, sleep apnea, or other sleep disorders. Chronic pain. Menopause. Stroke. Abuse of alcohol, tobacco, or illegal drugs. Mental health conditions, such as depression. Caffeine. Neurological disorders, such as Alzheimer's disease. An overactive thyroid (hyperthyroidism). Sometimes, the cause of insomnia may not be known. What increases the risk? Risk factors for insomnia include: Gender. Women are affected more often than men. Age. Insomnia is more common as you get older. Stress. Lack of exercise. Irregular work schedule or working night shifts. Traveling between different time zones. Certain medical and mental health conditions. What are the signs or symptoms? If you have insomnia, the main symptom is having trouble falling asleep or having trouble staying asleep. This may lead to other symptoms, such as: Feeling fatigued or having low energy. Feeling nervous about going to sleep. Not feeling rested in the morning. Having trouble concentrating. Feeling irritable, anxious, or depressed. How is this diagnosed? This condition may be diagnosed based  on: Your symptoms and medical history. Your health care provider may ask about: Your sleep habits. Any medical conditions you have. Your mental health. A physical exam. How is this treated? Treatment for insomnia depends on the cause. Treatment may focus on treating an underlying condition that is causing insomnia. Treatment may also include: Medicines to help you sleep. Counseling or therapy. Lifestyle adjustments to help you sleep better. Follow these instructions at home: Eating and drinking  Limit or avoid alcohol, caffeinated beverages, and cigarettes, especially close to bedtime. These can disrupt your sleep. Do not eat a large meal or eat spicy foods right before bedtime. This can lead to digestive discomfort that can make it hard for you to sleep.  Sleep habits  Keep a sleep diary to help you and your health care provider figure out what could be causing your insomnia. Write down: When you sleep. When you wake up during the night. How well you sleep. How rested you feel the next day. Any side effects of medicines you are taking. What you eat and drink. Make your bedroom a dark, comfortable place where it is easy to fall asleep. Put up shades or blackout curtains to block light from outside. Use a white noise machine to block noise. Keep the temperature cool. Limit screen use before bedtime. This includes: Watching TV. Using your smartphone, tablet, or computer. Stick to a routine that includes going to bed and waking up at the same times every day and night. This can help you fall asleep faster. Consider making a quiet activity, such as reading, part of your nighttime routine. Try to avoid taking naps during the day so that you sleep better at night. Get out of bed if you are still awake after 15 minutes of trying to sleep. Keep the lights down, but try reading or doing a quiet   activity. When you feel sleepy, go back to bed.  General instructions Take over-the-counter  and prescription medicines only as told by your health care provider. Exercise regularly, as told by your health care provider. Avoid exercise starting several hours before bedtime. Use relaxation techniques to manage stress. Ask your health care provider to suggest some techniques that may work well for you. These may include: Breathing exercises. Routines to release muscle tension. Visualizing peaceful scenes. Make sure that you drive carefully. Avoid driving if you feel very sleepy. Keep all follow-up visits as told by your health care provider. This is important. Contact a health care provider if: You are tired throughout the day. You have trouble in your daily routine due to sleepiness. You continue to have sleep problems, or your sleep problems get worse. Get help right away if: You have serious thoughts about hurting yourself or someone else. If you ever feel like you may hurt yourself or others, or have thoughts about taking your own life, get help right away. You can go to your nearest emergency department or call: Your local emergency services (911 in the [place] A suicide crisis helpline, such as the National Suicide Prevention Lifeline at 1-800-273-8255. This is open 24 hours a day. Summary Insomnia is a sleep disorder that makes it difficult to fall asleep or stay asleep. Insomnia can be long-term (chronic) or short-term (acute). Treatment for insomnia depends on the cause. Treatment may focus on treating an underlying condition that is causing insomnia. Keep a sleep diary to help you and your health care provider figure out what could be causing your insomnia. This information is not intended to replace advice given to you by your health care provider. Make sure you discuss any questions you have with your healthcare provider. Document Revised: 03/02/2020 Document Reviewed: 03/02/2020 Elsevier Patient Education  2022 Elsevier Inc.  

## 2020-11-30 LAB — PSA, TOTAL AND FREE
PSA, Free Pct: 31.7 %
PSA, Free: 0.38 ng/mL
Prostate Specific Ag, Serum: 1.2 ng/mL (ref 0.0–4.0)

## 2020-12-01 ENCOUNTER — Telehealth: Payer: Self-pay

## 2020-12-01 NOTE — Telephone Encounter (Signed)
-----   Message from [name], MD sent at 11/30/2020  1:21 PM EDT ----- Please inform the patient that labs are normal. Thank you.

## 2020-12-01 NOTE — Telephone Encounter (Signed)
Pt was called and a VM was left informing patient of lab results. 

## 2021-01-18 ENCOUNTER — Ambulatory Visit: Payer: Self-pay | Admitting: Nurse Practitioner

## 2021-02-15 ENCOUNTER — Other Ambulatory Visit: Payer: Self-pay

## 2021-02-15 ENCOUNTER — Encounter: Payer: Self-pay | Admitting: Family Medicine

## 2021-02-15 ENCOUNTER — Ambulatory Visit: Payer: Self-pay | Attending: Family Medicine | Admitting: Family Medicine

## 2021-02-15 VITALS — BP 141/76 | HR 90 | Ht 73.0 in | Wt 136.2 lb

## 2021-02-15 DIAGNOSIS — Z1211 Encounter for screening for malignant neoplasm of colon: Secondary | ICD-10-CM

## 2021-02-15 DIAGNOSIS — Z23 Encounter for immunization: Secondary | ICD-10-CM

## 2021-02-15 DIAGNOSIS — R918 Other nonspecific abnormal finding of lung field: Secondary | ICD-10-CM

## 2021-02-15 DIAGNOSIS — R030 Elevated blood-pressure reading, without diagnosis of hypertension: Secondary | ICD-10-CM

## 2021-02-15 DIAGNOSIS — M5416 Radiculopathy, lumbar region: Secondary | ICD-10-CM

## 2021-02-15 DIAGNOSIS — G4709 Other insomnia: Secondary | ICD-10-CM

## 2021-02-15 DIAGNOSIS — J439 Emphysema, unspecified: Secondary | ICD-10-CM

## 2021-02-15 DIAGNOSIS — K4090 Unilateral inguinal hernia, without obstruction or gangrene, not specified as recurrent: Secondary | ICD-10-CM

## 2021-02-15 HISTORY — DX: Radiculopathy, lumbar region: M54.16

## 2021-02-15 MED ORDER — TRAZODONE HCL 50 MG PO TABS
50.0000 mg | ORAL_TABLET | Freq: Every evening | ORAL | 6 refills | Status: DC | PRN
  Filled 2021-02-15: qty 30, 30d supply, fill #0

## 2021-02-15 NOTE — Patient Instructions (Signed)
Insomnia Insomnia is a sleep disorder that makes it difficult to fall asleep or stay asleep. Insomnia can cause fatigue, low energy, difficulty concentrating, moodswings, and poor performance at work or school. There are three different ways to classify insomnia: Difficulty falling asleep. Difficulty staying asleep. Waking up too early in the morning. Any type of insomnia can be long-term (chronic) or short-term (acute). Both are common. Short-term insomnia usually lasts for three months or less. Chronic insomnia occurs at least three times a week for longer than threemonths. What are the causes? Insomnia may be caused by another condition, situation, or substance, such as: Anxiety. Certain medicines. Gastroesophageal reflux disease (GERD) or other gastrointestinal conditions. Asthma or other breathing conditions. Restless legs syndrome, sleep apnea, or other sleep disorders. Chronic pain. Menopause. Stroke. Abuse of alcohol, tobacco, or illegal drugs. Mental health conditions, such as depression. Caffeine. Neurological disorders, such as Alzheimer's disease. An overactive thyroid (hyperthyroidism). Sometimes, the cause of insomnia may not be known. What increases the risk? Risk factors for insomnia include: Gender. Women are affected more often than men. Age. Insomnia is more common as you get older. Stress. Lack of exercise. Irregular work schedule or working night shifts. Traveling between different time zones. Certain medical and mental health conditions. What are the signs or symptoms? If you have insomnia, the main symptom is having trouble falling asleep or having trouble staying asleep. This may lead to other symptoms, such as: Feeling fatigued or having low energy. Feeling nervous about going to sleep. Not feeling rested in the morning. Having trouble concentrating. Feeling irritable, anxious, or depressed. How is this diagnosed? This condition may be diagnosed based  on: Your symptoms and medical history. Your health care provider may ask about: Your sleep habits. Any medical conditions you have. Your mental health. A physical exam. How is this treated? Treatment for insomnia depends on the cause. Treatment may focus on treating an underlying condition that is causing insomnia. Treatment may also include: Medicines to help you sleep. Counseling or therapy. Lifestyle adjustments to help you sleep better. Follow these instructions at home: Eating and drinking  Limit or avoid alcohol, caffeinated beverages, and cigarettes, especially close to bedtime. These can disrupt your sleep. Do not eat a large meal or eat spicy foods right before bedtime. This can lead to digestive discomfort that can make it hard for you to sleep.  Sleep habits  Keep a sleep diary to help you and your health care provider figure out what could be causing your insomnia. Write down: When you sleep. When you wake up during the night. How well you sleep. How rested you feel the next day. Any side effects of medicines you are taking. What you eat and drink. Make your bedroom a dark, comfortable place where it is easy to fall asleep. Put up shades or blackout curtains to block light from outside. Use a white noise machine to block noise. Keep the temperature cool. Limit screen use before bedtime. This includes: Watching TV. Using your smartphone, tablet, or computer. Stick to a routine that includes going to bed and waking up at the same times every day and night. This can help you fall asleep faster. Consider making a quiet activity, such as reading, part of your nighttime routine. Try to avoid taking naps during the day so that you sleep better at night. Get out of bed if you are still awake after 15 minutes of trying to sleep. Keep the lights down, but try reading or doing a quiet   activity. When you feel sleepy, go back to bed.  General instructions Take over-the-counter  and prescription medicines only as told by your health care provider. Exercise regularly, as told by your health care provider. Avoid exercise starting several hours before bedtime. Use relaxation techniques to manage stress. Ask your health care provider to suggest some techniques that may work well for you. These may include: Breathing exercises. Routines to release muscle tension. Visualizing peaceful scenes. Make sure that you drive carefully. Avoid driving if you feel very sleepy. Keep all follow-up visits as told by your health care provider. This is important. Contact a health care provider if: You are tired throughout the day. You have trouble in your daily routine due to sleepiness. You continue to have sleep problems, or your sleep problems get worse. Get help right away if: You have serious thoughts about hurting yourself or someone else. If you ever feel like you may hurt yourself or others, or have thoughts about taking your own life, get help right away. You can go to your nearest emergency department or call: Your local emergency services (911 in the [place] A suicide crisis helpline, such as the National Suicide Prevention Lifeline at 1-800-273-8255. This is open 24 hours a day. Summary Insomnia is a sleep disorder that makes it difficult to fall asleep or stay asleep. Insomnia can be long-term (chronic) or short-term (acute). Treatment for insomnia depends on the cause. Treatment may focus on treating an underlying condition that is causing insomnia. Keep a sleep diary to help you and your health care provider figure out what could be causing your insomnia. This information is not intended to replace advice given to you by your health care provider. Make sure you discuss any questions you have with your healthcare provider. Document Revised: 03/02/2020 Document Reviewed: 03/02/2020 Elsevier Patient Education  2022 Elsevier Inc.  

## 2021-02-15 NOTE — Progress Notes (Signed)
Subjective:  Patient ID: Marcus Pace, male    DOB: Feb 16, 1956  Age: 65 y.o. MRN: [id number]  CC: Insomnia   HPI Marcus Pace is a 65 y.o. year old male with a history of Emphysema, previous PE in (07/2019 completed anticoagulation), chronic low back pain.  Interval History: His BP is elevated today but upon review of his previous blood pressures, they have been controlled.  He continues to have insomnia.  At his last visot we had discussed cutting out Red Bull but he has done so but still has insomnia, melatonin has been ineffective.  Sleeps at 10pm, wakes up to urinate at 2am and finds it difficult to go back to sleep.  Complains of inguinal Hernia but has no insurance to see a Development worker, international aid and has not applied for Financial assistance either. He ambulates with a cane due to Arthritis in his back for which he uses Ibuprofen which has been efective Has cut back on smoking to 2-3 cig/day; currently takes Wellbutrin. He is due for repeat CT chest for follow-up of right lower lobe irregular opacity. CTA chest from 11/15/2020 revealed: IMPRESSION: No evidence of acute pulmonary embolism.   1 cm right lower lobe irregular opacity. Increase in size of right paramediastinal 5 mm nodule.   Small left pleural effusion. Atelectasis or scarring along the peripheral left lower lobe and lingula. A more nodular area in this area probably reflects atelectasis. Attention on follow-up is recommended.   Emphysema (ICD10-J43.9).   Recommend repeat chest CT in 3 months.     Past Medical History:  Diagnosis Date   Arthritis    Bacteremia    2016 or so with hospitalization   Emphysema of lung (HCC) 07/20/2019   Inguinal hernia recurrent unilateral    herniation of mesentary and loops of bowel to right scrotal sac, 4.5 cm defect   Lesion of right lung 07/20/2019   Lumbar radiculopathy 02/15/2021   Pulmonary emboli (HCC) 07/20/2019    No past surgical history on file.  Family  History  Problem Relation Age of Onset   Cancer Mother    Heart disease Father     No Known Allergies  Outpatient Medications Prior to Visit  Medication Sig Dispense Refill   albuterol (VENTOLIN HFA) 108 (90 Base) MCG/ACT inhaler Inhale 2 puffs into the lungs every 4 (four) hours as needed for wheezing or shortness of breath. 8.5 g 6   buPROPion (WELLBUTRIN XL) 150 MG 24 hr tablet Take 1 tablet (150 mg total) by mouth daily. For smoking cessation 30 tablet 6   melatonin 3 MG TABS tablet Take 1 tablet (3 mg total) by mouth at bedtime. 30 tablet 3   mometasone-formoterol (DULERA) 100-5 MCG/ACT AERO Inhale 2 puffs into the lungs 2 (two) times daily. 13 g 6   Multiple Vitamin (MULTIVITAMIN WITH MINERALS) TABS tablet Take 1 tablet by mouth daily. 30 tablet 0   tamsulosin (FLOMAX) 0.4 MG CAPS capsule Take 1 capsule (0.4 mg total) by mouth daily. 30 capsule 6   thiamine 100 MG tablet Take 1 tablet (100 mg total) by mouth daily. 30 tablet 0   No facility-administered medications prior to visit.     ROS Review of Systems  Constitutional:  Negative for activity change and appetite change.  HENT:  Negative for sinus pressure and sore throat.   Eyes:  Negative for visual disturbance.  Respiratory:  Negative for cough, chest tightness and shortness of breath.   Cardiovascular:  Negative for chest pain  and leg swelling.  Gastrointestinal:  Negative for abdominal distention, abdominal pain, constipation and diarrhea.  Endocrine: Negative.   Genitourinary:  Negative for dysuria.  Musculoskeletal:  Positive for back pain. Negative for joint swelling and myalgias.  Skin:  Negative for rash.  Allergic/Immunologic: Negative.   Neurological:  Negative for weakness, light-headedness and numbness.  Psychiatric/Behavioral:  Positive for sleep disturbance. Negative for dysphoric mood and suicidal ideas.    Objective:  BP (!) 141/76   Pulse 90   Ht 6\' 1"  (1.854 m)   Wt 136 lb 3.2 oz (61.8 kg)   SpO2  98%   BMI 17.97 kg/m   BP/Weight 02/15/2021 11/29/2020 11/16/2020  Systolic BP 141 120 135  Diastolic BP 76 72 90  Wt. (Lbs) 136.2 131.6 130.95  BMI 17.97 17.36 17.76      Physical Exam Constitutional:      Appearance: He is well-developed.  Cardiovascular:     Rate and Rhythm: Normal rate.     Heart sounds: Normal heart sounds. No murmur heard. Pulmonary:     Effort: Pulmonary effort is normal.     Breath sounds: Normal breath sounds. No wheezing or rales.  Chest:     Chest wall: No tenderness.  Abdominal:     General: Bowel sounds are normal. There is no distension.     Palpations: Abdomen is soft. There is no mass.     Tenderness: There is no abdominal tenderness.     Hernia: A hernia (inguinal) is present.  Musculoskeletal:        General: Normal range of motion.     Right lower leg: No edema.     Left lower leg: No edema.  Neurological:     Mental Status: He is alert and oriented to person, place, and time.     Gait: Gait abnormal (ambulates with a cane).  Psychiatric:        Mood and Affect: Mood normal.    CMP Latest Ref Rng & Units 11/16/2020 11/15/2020 08/25/2019  Glucose 70 - 99 mg/dL 97 76 79  BUN 8 - 23 mg/dL 9 9 12   Creatinine 0.61 - 1.24 mg/dL 08/27/2019 1.61  Sodium 135 - 145 mmol/L 138 141 140  Potassium 3.5 - 5.1 mmol/L 3.8 3.9 4.7  Chloride 98 - 111 mmol/L 104 106 99  CO2 22 - 32 mmol/L 29 28 25   Calcium 8.9 - 10.3 mg/dL 9.1 9.1 9.5  Total Protein 6.5 - 8.1 g/dL - 7.8 7.6  Total Bilirubin 0.3 - 1.2 mg/dL - 1.0 1.0  Alkaline Phos 38 - 126 U/L - 89 84  AST 15 - 41 U/L - 30 36  ALT 0 - 44 U/L - 25 35    Lipid Panel  No results found for: CHOL, TRIG, HDL, CHOLHDL, VLDL, LDLCALC, LDLDIRECT  CBC    Component Value Date/Time   WBC 7.2 11/16/2020 0502   RBC 5.47 11/16/2020 0502   HGB 16.3 11/16/2020 0502   HGB 14.4 07/28/2019 1116   HCT 47.9 11/16/2020 0502   HCT 43.6 07/28/2019 1116   PLT 293 11/16/2020 0502   PLT 588 (H) 07/28/2019 1116    MCV 87.6 11/16/2020 0502   MCV 85 07/28/2019 1116   MCH 29.8 11/16/2020 0502   MCHC 34.0 11/16/2020 0502   RDW 13.7 11/16/2020 0502   RDW 13.1 07/28/2019 1116   LYMPHSABS 1.0 11/15/2020 0831   LYMPHSABS 1.1 07/28/2019 1116   MONOABS 0.8 11/15/2020 0831   EOSABS 0.3 11/15/2020 0831  EOSABS 0.5 (H) 07/28/2019 1116   BASOSABS 0.1 11/15/2020 0831   BASOSABS 0.1 07/28/2019 1116    No results found for: HGBA1C  Assessment & Plan:  1. Other insomnia Uncontrolled We will add on trazodone to regimen Continue sleep hygiene - traZODone (DESYREL) 50 MG tablet; Take 1 tablet (50 mg total) by mouth at bedtime as needed for sleep.  Dispense: 30 tablet; Refill: 6  2. Pulmonary emphysema, unspecified emphysema type (HCC) Stable Continue Dulera,SABA - CT CHEST NODULE FOLLOW UP LOW DOSE W/O; Future  3. Lung mass Follow-up CT chest ordered Strongly encouraged to work on quitting smoking  4. Lumbar radiculopathy Controlled on ibuprofen Ambulates with a cane  5. Screening for colon cancer - Fecal occult blood, imunochemical([facility])  6. Elevated blood pressure reading in office without diagnosis of hypertension Last BP was normal No regimen changes today we will continue to work on lifestyle  7. Inguinal hernia, right He needs to see a general surgeon for hernia repair however he does not have medical coverage Advised to apply for the [place] financial discount to facilitate referral  8. Need for immunization against influenza - Flu Vaccine QUAD 74mo+IM (Fluarix, Fluzone & Alfiuria Quad PF)   Meds ordered this encounter  Medications   traZODone (DESYREL) 50 MG tablet    Sig: Take 1 tablet (50 mg total) by mouth at bedtime as needed for sleep.    Dispense:  30 tablet    Refill:  6    Follow-up: Return in about 6 months (around 08/16/2021) for Medical conditions.       [name], MD, FAAFP. [facility] and [facility] [place],  [place] [phone]   02/15/2021, 2:55 PM

## 2021-02-15 NOTE — Progress Notes (Signed)
Not sleeping at night. 

## 2021-02-28 ENCOUNTER — Other Ambulatory Visit: Payer: Self-pay | Admitting: Family Medicine

## 2021-02-28 ENCOUNTER — Other Ambulatory Visit: Payer: Self-pay

## 2021-02-28 ENCOUNTER — Encounter (HOSPITAL_COMMUNITY): Payer: Self-pay

## 2021-02-28 ENCOUNTER — Ambulatory Visit (HOSPITAL_COMMUNITY)
Admission: RE | Admit: 2021-02-28 | Discharge: 2021-02-28 | Disposition: A | Discharge status: Home / Self Care | Payer: Self-pay | Source: Ambulatory Visit | Attending: Family Medicine | Admitting: Family Medicine

## 2021-02-28 DIAGNOSIS — R918 Other nonspecific abnormal finding of lung field: Secondary | ICD-10-CM

## 2021-02-28 DIAGNOSIS — J439 Emphysema, unspecified: Secondary | ICD-10-CM

## 2021-03-01 ENCOUNTER — Ambulatory Visit (HOSPITAL_COMMUNITY)
Admission: RE | Admit: 2021-03-01 | Discharge: 2021-03-01 | Disposition: A | Discharge status: Home / Self Care | Payer: Self-pay | Source: Ambulatory Visit | Attending: Family Medicine | Admitting: Family Medicine

## 2021-03-01 DIAGNOSIS — R918 Other nonspecific abnormal finding of lung field: Secondary | ICD-10-CM | POA: Insufficient documentation

## 2021-03-05 ENCOUNTER — Other Ambulatory Visit: Payer: Self-pay | Admitting: Family Medicine

## 2021-03-05 DIAGNOSIS — R918 Other nonspecific abnormal finding of lung field: Secondary | ICD-10-CM

## 2021-03-06 ENCOUNTER — Telehealth: Payer: Self-pay

## 2021-03-06 NOTE — Telephone Encounter (Signed)
-----   Message from [name], MD sent at 03/05/2021  8:27 AM EDT ----- Please inform him CT reveals a suspicious lung mass and I have referred him to the [facility]

## 2021-03-06 NOTE — Telephone Encounter (Signed)
Patient name and DOB has been verified Patient was informed of lab results. Patient had no questions.  

## 2021-03-07 ENCOUNTER — Telehealth: Payer: Self-pay | Admitting: *Deleted

## 2021-03-07 NOTE — Telephone Encounter (Signed)
Copied from CRM [id number]. Topic: General - Other >> Feb 28, 2021  3:12 PM Pawlus, Maxine Glenn A wrote: Reason for CRM: Imaging at [facility] long called in requested Dr Earley Abide signature on the new order that was just sent over,  please advise.

## 2021-03-08 NOTE — Telephone Encounter (Signed)
order was signed and procedure has been performed.

## 2021-03-23 ENCOUNTER — Ambulatory Visit (INDEPENDENT_AMBULATORY_CARE_PROVIDER_SITE_OTHER): Payer: Self-pay | Admitting: Emergency Medicine

## 2021-03-23 ENCOUNTER — Other Ambulatory Visit: Payer: Self-pay

## 2021-03-23 ENCOUNTER — Encounter: Payer: Self-pay | Admitting: Emergency Medicine

## 2021-03-23 DIAGNOSIS — Z720 Tobacco use: Secondary | ICD-10-CM

## 2021-03-23 DIAGNOSIS — J439 Emphysema, unspecified: Secondary | ICD-10-CM

## 2021-03-23 DIAGNOSIS — R918 Other nonspecific abnormal finding of lung field: Secondary | ICD-10-CM

## 2021-03-23 MED ORDER — STIOLTO RESPIMAT 2.5-2.5 MCG/ACT IN AERS: 2.0000 | INHALATION_SPRAY | Freq: Every day | RESPIRATORY_TRACT | 0 refills | Status: DC

## 2021-03-23 NOTE — Assessment & Plan Note (Signed)
COPD with daily symptoms, daily albuterol use.  He has not had PFT but I think he would likely benefit from increasing his bronchodilator therapy.  We will do a trial of Stiolto to see if he gets more benefit.  If so we will continue it going forward.  We will perform pulmonary function testing at some point in the future.

## 2021-03-23 NOTE — Assessment & Plan Note (Signed)
Multiple nodules present, will need follow-up.  He has had lung abscesses, previous nodules in the past that have resolved.  Etiology unclear.  I will check a QuantiFERON gold today.  He is at risk for lung cancer given his tobacco history.  I will check a PET CT in 2 months to see if there is been interval change in the nodules, resolution.  Also check for hypermetabolism.  If suspicion for malignancy remains a we will need to arrange for bronchoscopy and tissue sampling.

## 2021-03-23 NOTE — Patient Instructions (Signed)
We will perform blood work today. We will arrange for you to have a repeat scan (PET scan+ Ct scan) of your chest done at the end of December so we can follow your pulmonary nodules. Temporarily stop your Elwin Sleight and try starting Stiolto 2 puffs once daily.  Keep track of whether the new inhaler helps you more.  If so call our office and let us know and we will send a prescription for it to your pharmacy. Keep your albuterol available to use 2 puffs when you need it for shortness of breath, chest tightness, wheezing. Follow [name] in December after your chest scan so we can review the results together.

## 2021-03-23 NOTE — Progress Notes (Signed)
Subjective:    Patient ID: Marcus Pace, male    DOB: 02-May-1956, 65 y.o.   MRN: [id number]  HPI This is a new consultation for 65 year old man with history of tobacco, smoking 5 cig a day (50 pack years), alcohol use, prior hospitalization for bacteremia, who was admitted 3/16-3/19 with fever, sweats, chest discomfort and hemoptysis.  CT scan of the chest reviewed by me done on 07/20/2019 showed centrilobular emphysematous change, bilateral basilar bronchiectasis, aspirated material in the right mainstem bronchus, and a right infrahilar spiculated 6 cm consolidation with multiple internal loculated fluid components and thick wall suggestive of a lung abscess versus mass.  Also noted were small right infrahilar segmental PE.  He was treated with antibiotics for possible aspiration pneumonia with lung abscess, anticoagulation.  He underwent thoracentesis of a small loculated right pleural effusion on 3/19, Gram stain and culture negative. He was discharged to complete 4 more weeks of Augmentiin. Also on eliquis. He reports cough is much better, no sputum. Fevers resolved. He does have some exertional SOB. No wheeze.   Chest x-ray today, reviewed by me and shows improvement in his right perihilar infiltrate, much smaller.  I do not see any air-fluid level.   ROV 03/23/21 --65 year old gentleman with a history of tobacco use (50 pack years), continues to smoke, and abnormal CT chest after admission for a lung abscess and pneumonia, associated small segmental PE.  His chest imaging improved, consistent with abscess that have been adequately treated.  Admitted with COPD exacerbation 11/15/2020 and a CT chest was done as below.  Follow-up performed 03/01/2021 as below Currently managed on Dulera.  Uses albuterol about once a day. Still has exertional SOB. He has some intermittent cough, occasionally productive of clear.   CT chest 03/01/2021 reviewed by me, shows a new 1.3 x 1.5 cm right upper lobe  nodule, new 5 to 6 mm right upper lobe nodule as well as increase in size on 8 x 11 nodule in the superior segment of the right lower lobe.  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    Review of Systems  Past Medical History:  Diagnosis Date   Arthritis    Bacteremia    2016 or so with hospitalization   Emphysema of lung (HCC) 07/20/2019   Inguinal hernia recurrent unilateral    herniation of mesentary and loops of bowel to right scrotal sac, 4.5 cm defect   Lesion of right lung 07/20/2019   Lumbar radiculopathy 02/15/2021   Pulmonary emboli (HCC) 07/20/2019     Family History  Problem Relation Age of Onset   Cancer Mother    Heart disease Father      Social History   Socioeconomic History   Marital status: Single    Spouse name: Not on file   Number of children: Not on file   Years of education: Not on file   Highest education level: Not on file  Occupational History   Not on file  Tobacco Use   Smoking status: Every Day    Packs/day: 1.00    Types: Cigarettes   Smokeless tobacco: Never   Tobacco comments:    3 cigarettes smoked daily ARJ 03/23/21  Substance and Sexual Activity   Alcohol use: Yes   Drug use: Never   Sexual activity: Not Currently  Other Topics Concern   Not on file  Social History Narrative   Lives with sister and grown nephew  Social Determinants of Health   Financial Resource Strain: Not on file  Food Insecurity: Not on file  Transportation Needs: Not on file  Physical Activity: Not on file  Stress: Not on file  Social Connections: Not on file  Intimate Partner Violence: Not on file    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  He may have been exposed to TB as a child.   No Known Allergies   Outpatient Medications Prior to Visit  Medication Sig Dispense Refill   albuterol (VENTOLIN HFA) 108 (90 Base) MCG/ACT inhaler Inhale 2 puffs into the lungs every 4  (four) hours as needed for wheezing or shortness of breath. 8.5 g 6   buPROPion (WELLBUTRIN XL) 150 MG 24 hr tablet Take 1 tablet (150 mg total) by mouth daily. For smoking cessation 30 tablet 6   melatonin 3 MG TABS tablet Take 1 tablet (3 mg total) by mouth at bedtime. 30 tablet 3   mometasone-formoterol (DULERA) 100-5 MCG/ACT AERO Inhale 2 puffs into the lungs 2 (two) times daily. 13 g 6   Multiple Vitamin (MULTIVITAMIN WITH MINERALS) TABS tablet Take 1 tablet by mouth daily. 30 tablet 0   tamsulosin (FLOMAX) 0.4 MG CAPS capsule Take 1 capsule (0.4 mg total) by mouth daily. 30 capsule 6   thiamine 100 MG tablet Take 1 tablet (100 mg total) by mouth daily. 30 tablet 0   traZODone (DESYREL) 50 MG tablet Take 1 tablet (50 mg total) by mouth at bedtime as needed for sleep. 30 tablet 6   No facility-administered medications prior to visit.        Objective:   Physical Exam  Vitals:   03/23/21 1028  BP: 136/82  Pulse: 94  Temp: 97.8 F (36.6 C)  TempSrc: Oral  SpO2: 97%  Weight: 140 lb 12.8 oz (63.9 kg)  Height: 6' (1.829 m)    Gen: Pleasant, thin, in no distress,  normal affect  ENT: No lesions,  mouth clear,  oropharynx clear, no postnasal drip  Neck: No JVD, no stridor  Lungs: No use of accessory muscles, no crackles or wheezing on normal respiration, no wheeze on forced expiration  Cardiovascular: RRR, heart sounds normal, no murmur or gallops, no peripheral edema  Musculoskeletal: No deformities, no cyanosis or clubbing  Neuro: alert, awake, non focal  Skin: Warm, no lesions or rash      Assessment & Plan:  Pulmonary nodules Multiple nodules present, will need follow-up.  He has had lung abscesses, previous nodules in the past that have resolved.  Etiology unclear.  I will check a QuantiFERON gold today.  He is at risk for lung cancer given his tobacco history.  I will check a PET CT in 2 months to see if there is been interval change in the nodules, resolution.   Also check for hypermetabolism.  If suspicion for malignancy remains a we will need to arrange for bronchoscopy and tissue sampling.  Emphysema of lung (HCC) COPD with daily symptoms, daily albuterol use.  He has not had PFT but I think he would likely benefit from increasing his bronchodilator therapy.  We will do a trial of Stiolto to see if he gets more benefit.  If so we will continue it going forward.  We will perform pulmonary function testing at some point in the future.  Tobacco abuse He has cut down but continues to smoke.  We can discuss further efforts at cessation going forward.  Levy Pupa, MD,  PhD 03/23/2021, 11:03 AM [facility] [phone] or if no answer [phone]

## 2021-03-23 NOTE — Assessment & Plan Note (Signed)
He has cut down but continues to smoke.  We can discuss further efforts at cessation going forward.

## 2021-03-23 NOTE — Addendum Note (Signed)
Addended by: Dorisann Frames R on: 03/23/2021 11:07 AM   Modules accepted: Orders

## 2021-03-23 NOTE — Addendum Note (Signed)
Addended by: Dorisann Frames R on: 03/23/2021 01:43 PM   Modules accepted: Orders

## 2021-03-27 LAB — QUANTIFERON-TB GOLD PLUS
Mitogen-NIL: >10 IU/mL
NIL: 0.05 IU/mL
QuantiFERON-TB Gold Plus: NEGATIVE
TB1-NIL: 0 IU/mL
TB2-NIL: 0.01 IU/mL

## 2021-04-02 ENCOUNTER — Telehealth: Payer: Self-pay | Admitting: Emergency Medicine

## 2021-04-02 NOTE — Telephone Encounter (Signed)
Called patient but he did not answer and his VM is not setup. Will attempt to call back later.

## 2021-04-03 NOTE — Telephone Encounter (Signed)
Patients get 57% discount for not having insurance for hospital and physcians services then they would see if they qualify for medicaid if not then they would see what the next step is this is not something the PCC's are involved in.

## 2021-04-03 NOTE — Telephone Encounter (Signed)
Is there a discount when a pt doesn't have insurance?

## 2021-04-17 NOTE — Telephone Encounter (Signed)
ATC pt. VM box is full.  °

## 2021-04-24 NOTE — Telephone Encounter (Signed)
Attempted to call pt but unable to reach and unable to leave VM due to mailbox not being set up. Will try to call back later. 

## 2021-04-26 NOTE — Telephone Encounter (Signed)
I called the patient to give him a number to call for the billing office.  He did not have any other questions.

## 2021-04-26 NOTE — Telephone Encounter (Signed)
He will need to go through Billing to set up payment plans there # is [phone]

## 2021-04-26 NOTE — Telephone Encounter (Signed)
Called spoke to pt. Informed him of the information per Johny Drilling. Pt states he would be willing to do the PET scan if there is a payment plan option. Pt is willing to discuss payment options in detail and said he is willing to schedule PET scan after knowing his payment options.    Will forward to PCC's to help with this. Will also forward to [name] as Lorain Childes. Pt was advised to follow up in December after PET.

## 2021-06-10 IMAGING — CR DG LUMBAR SPINE COMPLETE 4+V
5 series · 5 of 5 positions shown · non-contrast
Comparison: 07/08/2019

CLINICAL DATA: Low back pain

EXAM:
LUMBAR SPINE - COMPLETE 4+ VIEW

[l-spine ap]
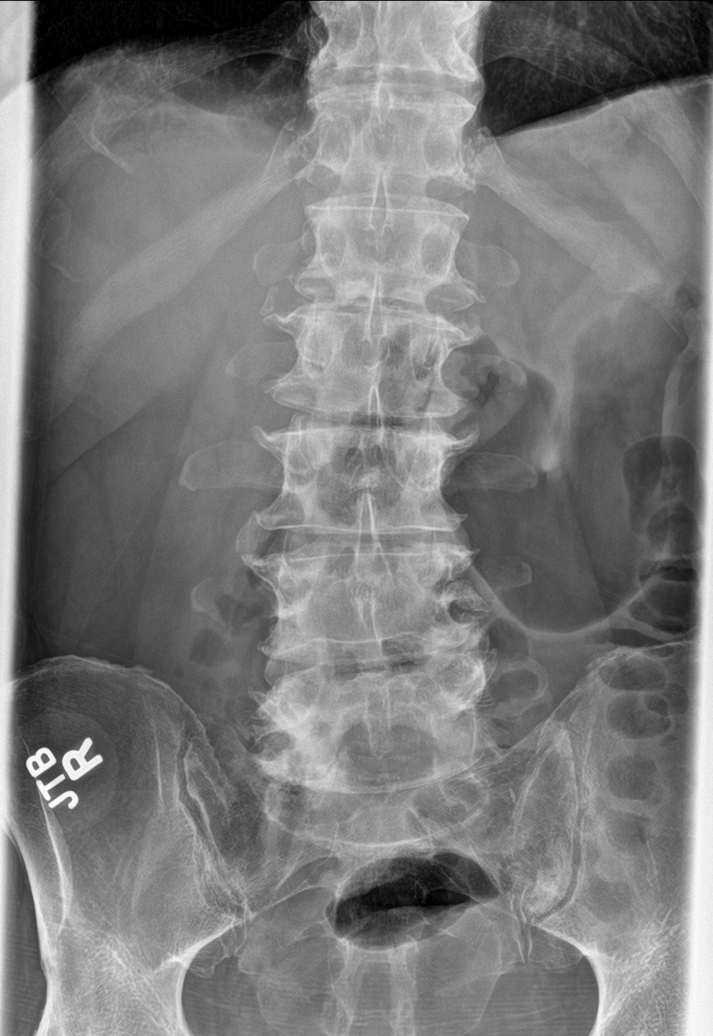

[l-spine obl (1 of 2)]
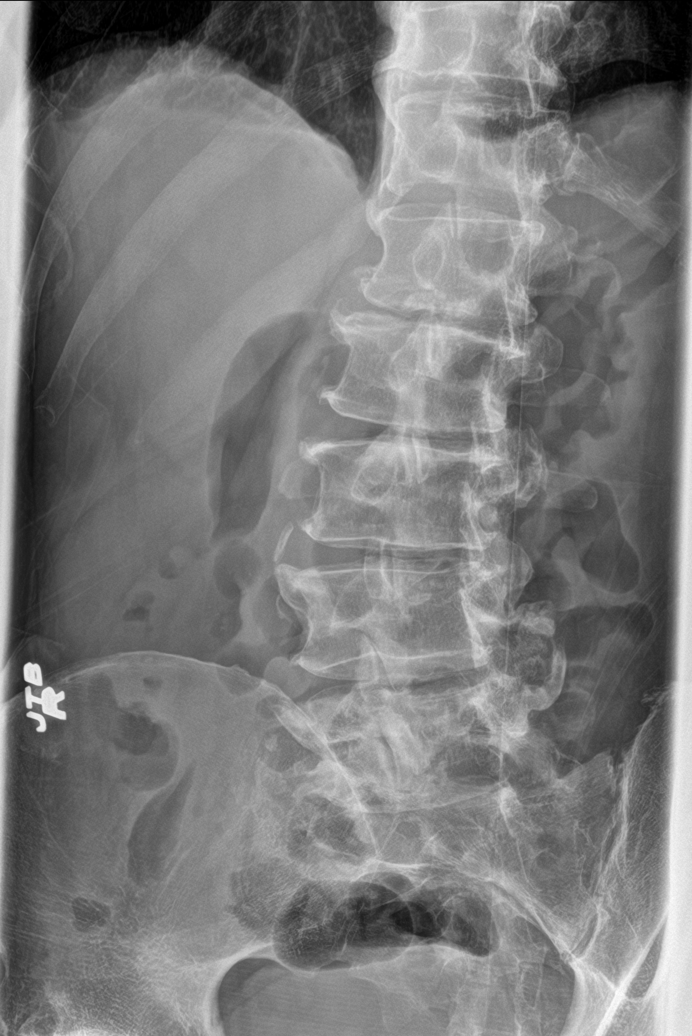

[l-spine obl (2 of 2)]
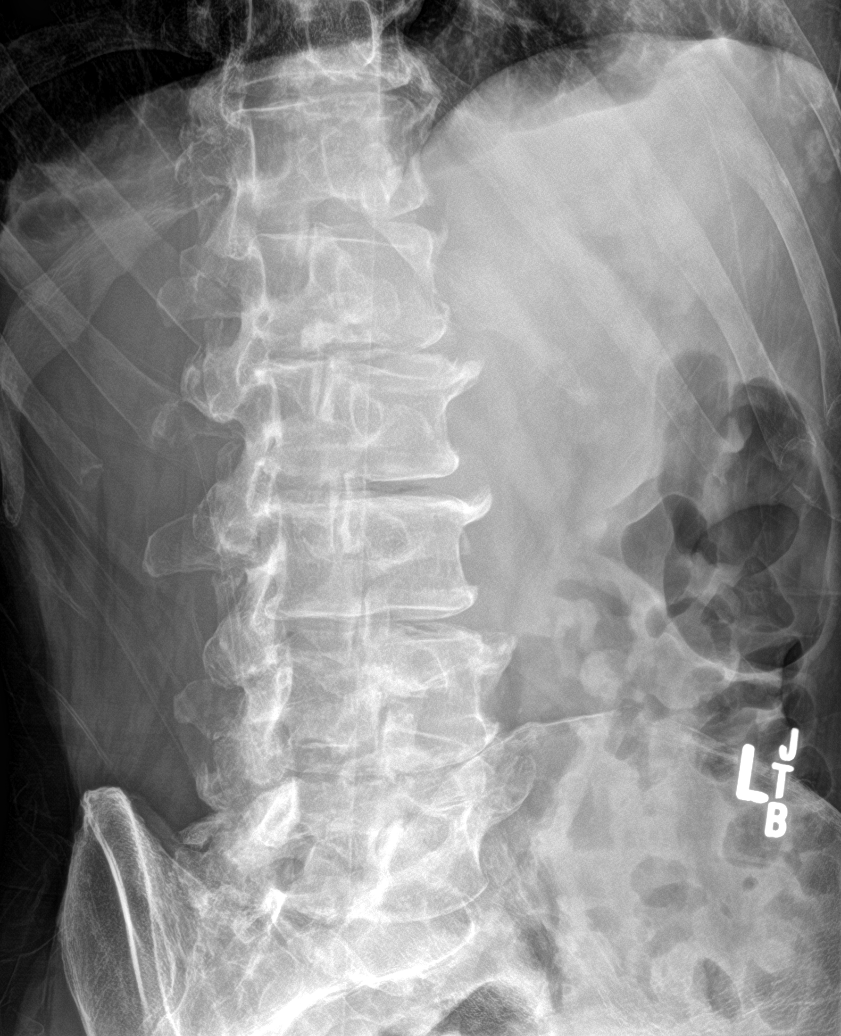

[l-spine lat]
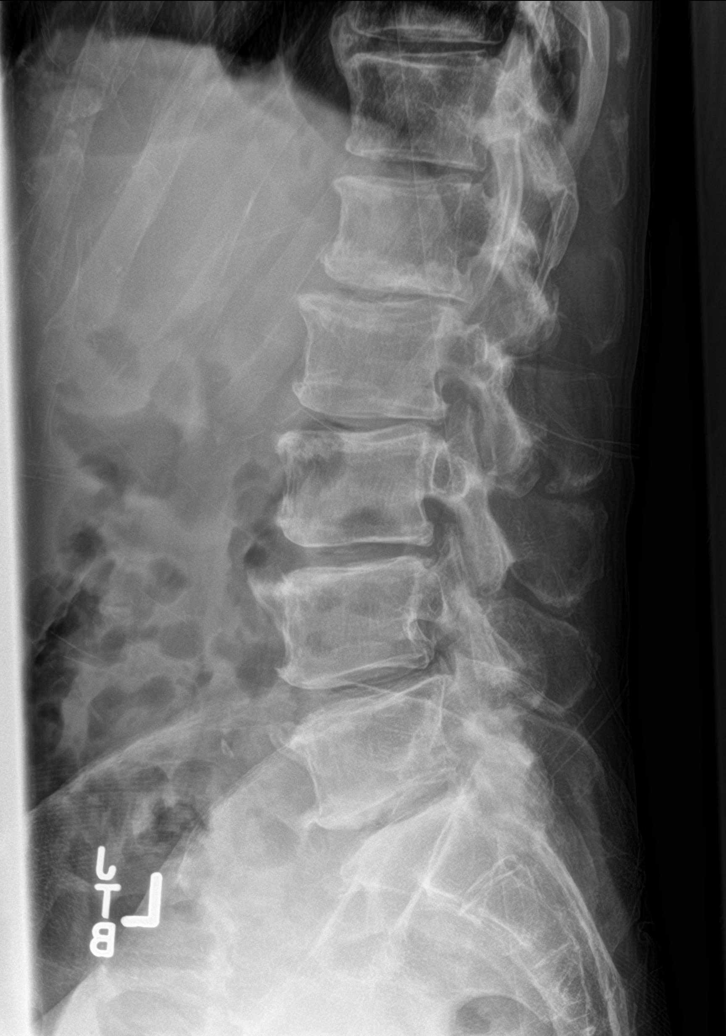

[l-spine spot]
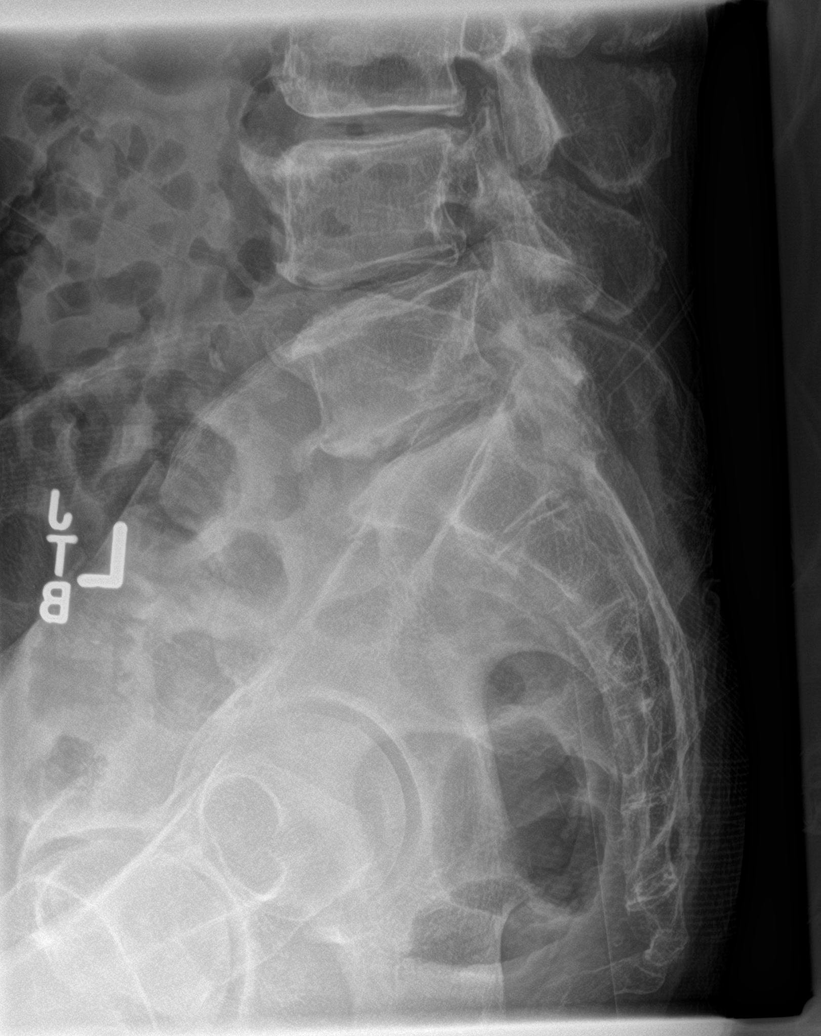

[5 of 5 positions shown; findings below may reference images not displayed]

FINDINGS: Five lumbar type vertebral segments. Vertebral body heights and
alignment are maintained. No fracture identified. Advanced
multilevel intervertebral disc space loss with associated
degenerative endplate changes. Prominent lower lumbar facet
arthrosis.
IMPRESSION: Advanced multilevel lumbar spondylosis without evidence of interval
progression from 07/08/2019. No new/acute findings.

## 2021-06-11 ENCOUNTER — Other Ambulatory Visit: Payer: Self-pay

## 2021-06-28 ENCOUNTER — Other Ambulatory Visit (HOSPITAL_COMMUNITY): Payer: Self-pay

## 2021-06-28 ENCOUNTER — Encounter (HOSPITAL_COMMUNITY)
Admission: RE | Admit: 2021-06-28 | Discharge: 2021-06-28 | Disposition: A | Discharge status: Home / Self Care | Payer: Self-pay | Source: Ambulatory Visit | Attending: Emergency Medicine | Admitting: Emergency Medicine

## 2021-07-02 ENCOUNTER — Other Ambulatory Visit (HOSPITAL_COMMUNITY): Payer: Self-pay

## 2021-07-02 ENCOUNTER — Encounter (HOSPITAL_COMMUNITY)
Admission: RE | Admit: 2021-07-02 | Discharge: 2021-07-02 | Disposition: A | Discharge status: Home / Self Care | Payer: Self-pay | Source: Ambulatory Visit | Attending: Emergency Medicine | Admitting: Emergency Medicine

## 2021-07-02 ENCOUNTER — Other Ambulatory Visit: Payer: Self-pay

## 2021-07-02 DIAGNOSIS — J439 Emphysema, unspecified: Secondary | ICD-10-CM

## 2021-07-02 LAB — GLUCOSE, CAPILLARY: Glucose-Capillary: 104 mg/dL — ABNORMAL HIGH (ref 70–99)

## 2021-07-02 MED ORDER — FLUDEOXYGLUCOSE F - 18 (FDG) INJECTION
7.0000 | Freq: Once | INTRAVENOUS | Status: AC | PRN
  Administered 2021-07-02: 7 via INTRAVENOUS

## 2021-08-15 ENCOUNTER — Ambulatory Visit: Payer: Self-pay | Admitting: Family Medicine

## 2021-08-27 ENCOUNTER — Ambulatory Visit: Payer: Self-pay | Admitting: Family Medicine

## 2021-12-05 ENCOUNTER — Other Ambulatory Visit: Payer: Self-pay

## 2021-12-05 ENCOUNTER — Other Ambulatory Visit (HOSPITAL_BASED_OUTPATIENT_CLINIC_OR_DEPARTMENT_OTHER): Payer: Self-pay

## 2021-12-05 ENCOUNTER — Encounter: Payer: Self-pay | Admitting: Family Medicine

## 2021-12-05 ENCOUNTER — Ambulatory Visit: Payer: MEDICAID | Attending: Family Medicine | Admitting: Family Medicine

## 2021-12-05 VITALS — BP 110/60 | HR 71 | Temp 97.9°F | Ht 72.0 in | Wt 139.0 lb

## 2021-12-05 DIAGNOSIS — M549 Dorsalgia, unspecified: Secondary | ICD-10-CM

## 2021-12-05 DIAGNOSIS — R918 Other nonspecific abnormal finding of lung field: Secondary | ICD-10-CM

## 2021-12-05 DIAGNOSIS — G4709 Other insomnia: Secondary | ICD-10-CM

## 2021-12-05 DIAGNOSIS — J439 Emphysema, unspecified: Secondary | ICD-10-CM

## 2021-12-05 DIAGNOSIS — R399 Unspecified symptoms and signs involving the genitourinary system: Secondary | ICD-10-CM

## 2021-12-05 DIAGNOSIS — Z1159 Encounter for screening for other viral diseases: Secondary | ICD-10-CM

## 2021-12-05 DIAGNOSIS — Z720 Tobacco use: Secondary | ICD-10-CM

## 2021-12-05 DIAGNOSIS — Z1211 Encounter for screening for malignant neoplasm of colon: Secondary | ICD-10-CM

## 2021-12-05 MED ORDER — TRELEGY ELLIPTA 100-62.5-25 MCG/ACT IN AEPB
1.0000 | INHALATION_SPRAY | Freq: Every day | RESPIRATORY_TRACT | 6 refills | Status: DC
  Filled 2021-12-05: qty 1, fill #0

## 2021-12-05 MED ORDER — BUPROPION HCL ER (XL) 150 MG PO TB24
150.0000 mg | ORAL_TABLET | Freq: Every day | ORAL | 6 refills | Status: DC
  Filled 2021-12-05: qty 30, 30d supply, fill #0
  Filled 2022-02-20: qty 30, 30d supply, fill #1
  Filled 2022-10-15: qty 30, 30d supply, fill #2
  Filled 2022-12-04: qty 30, 30d supply, fill #3

## 2021-12-05 MED ORDER — TAMSULOSIN HCL 0.4 MG PO CAPS
0.4000 mg | ORAL_CAPSULE | Freq: Every day | ORAL | 6 refills | Status: DC
  Filled 2021-12-05: qty 30, 30d supply, fill #0
  Filled 2022-02-20: qty 30, 30d supply, fill #1
  Filled 2022-10-15: qty 30, 30d supply, fill #2
  Filled 2022-12-04: qty 30, 30d supply, fill #3

## 2021-12-05 MED ORDER — ALBUTEROL SULFATE HFA 108 (90 BASE) MCG/ACT IN AERS
2.0000 | INHALATION_SPRAY | RESPIRATORY_TRACT | 6 refills | Status: DC | PRN
  Filled 2021-12-05: qty 8.5, 16d supply, fill #0
  Filled 2022-02-20: qty 6.7, 25d supply, fill #1
  Filled 2022-10-15: qty 6.7, 25d supply, fill #2
  Filled 2022-12-04: qty 8.5, 17d supply, fill #3
  Filled 2022-12-04: qty 6.7, 25d supply, fill #3

## 2021-12-05 MED ORDER — TRAZODONE HCL 50 MG PO TABS
50.0000 mg | ORAL_TABLET | Freq: Every evening | ORAL | 6 refills | Status: DC | PRN
  Filled 2021-12-05: qty 30, 30d supply, fill #0
  Filled 2022-02-20: qty 30, 30d supply, fill #1
  Filled 2022-10-15: qty 30, 30d supply, fill #2
  Filled 2022-12-04: qty 30, 30d supply, fill #3

## 2021-12-05 NOTE — Patient Instructions (Addendum)
Please call for your appointment with [name] [phone] [facility]

## 2021-12-05 NOTE — Progress Notes (Signed)
Medication refills

## 2021-12-05 NOTE — Progress Notes (Signed)
Subjective:  Patient ID: Marcus Pace, male    DOB: [birth year]  Age: 66 y.o. MRN: [id number]  CC: Back Pain   HPI Marcus Pace is a 66 y.o. year old male with a history of Emphysema, previous PE in (07/2019 completed anticoagulation), chronic low back pain.  Interval History: Seen by [name] in 03/2021 for follow-up of pulmonary nodules. PET/CT from 06/2021 revealed: IMPRESSION: 1. Hypermetabolic pulmonary nodules with somewhat of a waxing and waning history, suggesting an infectious/inflammatory etiology. Malignancy cannot be excluded, especially within the dominant right lower lobe nodule, which has been present since at least 11/15/2020. Consider short-term follow-up CT chest in 3 months, as clinically indicated. 2. Tiny left pleural effusion. 3. Very large right inguinal hernia containing a large amount of unobstructed small bowel, with extension into the scrotal sac. 4. Aortic atherosclerosis (ICD10-I70.0). Coronary artery calcification. 5.  Emphysema (ICD10-J43.9).    His back continues to hurt, worse in the morning when he attempts to get out of bed. Tylenol and Aleve provide mild relief. Doing well on his inhalers but he continues to smoke.  I had placed him on bupropion for smoking cessation.  He informs me he is not ready to quit as he has a lot on his mind. Insomnia is controlled and urinary tract symptoms are stable. Past Medical History:  Diagnosis Date   Arthritis    Bacteremia    2016 or so with hospitalization   Emphysema of lung (HCC) 07/20/2019   Inguinal hernia recurrent unilateral    herniation of mesentary and loops of bowel to right scrotal sac, 4.5 cm defect   Lesion of right lung 07/20/2019   Lumbar radiculopathy 02/15/2021   Pulmonary emboli (HCC) 07/20/2019    No past surgical history on file.  Family History  Problem Relation Age of Onset   Cancer Mother    Heart disease Father     Social History   Socioeconomic History    Marital status: Single    Spouse name: Not on file   Number of children: Not on file   Years of education: Not on file   Highest education level: Not on file  Occupational History   Not on file  Tobacco Use   Smoking status: Every Day    Packs/day: 1.00    Types: Cigarettes   Smokeless tobacco: Never   Tobacco comments:    3 cigarettes smoked daily ARJ 03/23/21  Substance and Sexual Activity   Alcohol use: Yes   Drug use: Never   Sexual activity: Not Currently  Other Topics Concern   Not on file  Social History Narrative   Lives with sister and grown nephew   Social Determinants of Health   Financial Resource Strain: Not on file  Food Insecurity: Not on file  Transportation Needs: Not on file  Physical Activity: Not on file  Stress: Not on file  Social Connections: Not on file    No Known Allergies  Outpatient Medications Prior to Visit  Medication Sig Dispense Refill   melatonin 3 MG TABS tablet Take 1 tablet (3 mg total) by mouth at bedtime. 30 tablet 3   Multiple Vitamin (MULTIVITAMIN WITH MINERALS) TABS tablet Take 1 tablet by mouth daily. 30 tablet 0   thiamine 100 MG tablet Take 1 tablet (100 mg total) by mouth daily. 30 tablet 0   albuterol (VENTOLIN HFA) 108 (90 Base) MCG/ACT inhaler Inhale 2 puffs into the lungs every 4 (four) hours as needed for wheezing or  shortness of breath. 8.5 g 6   buPROPion (WELLBUTRIN XL) 150 MG 24 hr tablet Take 1 tablet (150 mg total) by mouth daily. For smoking cessation 30 tablet 6   mometasone-formoterol (DULERA) 100-5 MCG/ACT AERO Inhale 2 puffs into the lungs 2 (two) times daily. 13 g 6   tamsulosin (FLOMAX) 0.4 MG CAPS capsule Take 1 capsule (0.4 mg total) by mouth daily. 30 capsule 6   Tiotropium Bromide-Olodaterol (STIOLTO RESPIMAT) 2.5-2.5 MCG/ACT AERS Inhale 2 puffs into the lungs daily. 4 g 0   traZODone (DESYREL) 50 MG tablet Take 1 tablet (50 mg total) by mouth at bedtime as needed for sleep. 30 tablet 6   No  facility-administered medications prior to visit.     ROS Review of Systems  Constitutional:  Negative for activity change and appetite change.  HENT:  Negative for sinus pressure and sore throat.   Respiratory:  Negative for chest tightness, shortness of breath and wheezing.   Cardiovascular:  Negative for chest pain and palpitations.  Gastrointestinal:  Negative for abdominal distention, abdominal pain and constipation.  Genitourinary: Negative.   Musculoskeletal: Negative.   Psychiatric/Behavioral:  Negative for behavioral problems and dysphoric mood.     Objective:  BP 110/60   Pulse 71   Temp 97.9 F (36.6 C) (Other (Comment))   Ht 6' (1.829 m)   Wt 139 lb (63 kg)   SpO2 96%   BMI 18.85 kg/m      12/05/2021    3:39 PM 12/05/2021    3:04 PM 03/23/2021   10:28 AM  BP/Weight  Systolic BP 110 143 136  Diastolic BP 60 77 82  Wt. (Lbs)  139 140.8  BMI  18.85 kg/m2 19.1 kg/m2      Physical Exam Constitutional:      Appearance: He is well-developed.  Cardiovascular:     Rate and Rhythm: Normal rate.     Heart sounds: Normal heart sounds. No murmur heard. Pulmonary:     Effort: Pulmonary effort is normal.     Breath sounds: Normal breath sounds. No wheezing or rales.  Chest:     Chest wall: No tenderness.  Abdominal:     General: Bowel sounds are normal. There is no distension.     Palpations: Abdomen is soft. There is no mass.     Tenderness: There is no abdominal tenderness.  Musculoskeletal:        General: Normal range of motion.     Right lower leg: No edema.     Left lower leg: No edema.  Neurological:     Mental Status: He is alert and oriented to person, place, and time.  Psychiatric:        Mood and Affect: Mood normal.        Latest Ref Rng & Units 11/16/2020    5:02 AM 11/15/2020    8:31 AM 08/25/2019   12:00 PM  CMP  Glucose 70 - 99 mg/dL 97  76  79   BUN 8 - 23 mg/dL 9  9  12    Creatinine 0.61 - 1.24 mg/dL  9.47  0.96   Sodium 135 -  145 mmol/L 138  141  140   Potassium 3.5 - 5.1 mmol/L 3.8  3.9  4.7   Chloride 98 - 111 mmol/L 104  106  99   CO2 22 - 32 mmol/L 29  28  25    Calcium 8.9 - 10.3 mg/dL 9.1  9.1  9.5   Total Protein  6.5 - 8.1 g/dL  7.8  7.6   Total Bilirubin 0.3 - 1.2 mg/dL  1.0  1.0   Alkaline Phos 38 - 126 U/L  89  84   AST 15 - 41 U/L  30  36   ALT 0 - 44 U/L  25  35     Lipid Panel  No results found for: "CHOL", "TRIG", "HDL", "CHOLHDL", "VLDL", "LDLCALC", "LDLDIRECT"  CBC    Component Value Date/Time   WBC 7.2 11/16/2020 0502   RBC 5.47 11/16/2020 0502   HGB 16.3 11/16/2020 0502   HGB 14.4 07/28/2019 1116   HCT 47.9 11/16/2020 0502   HCT 43.6 07/28/2019 1116   PLT 293 11/16/2020 0502   PLT 588 (H) 07/28/2019 1116   MCV 87.6 11/16/2020 0502   MCV 85 07/28/2019 1116   MCH 29.8 11/16/2020 0502   MCHC 34.0 11/16/2020 0502   RDW 13.7 11/16/2020 0502   RDW 13.1 07/28/2019 1116   LYMPHSABS 1.0 11/15/2020 0831   LYMPHSABS 1.1 07/28/2019 1116   MONOABS 0.8 11/15/2020 0831   EOSABS 0.3 11/15/2020 0831   EOSABS 0.5 (H) 07/28/2019 1116   BASOSABS 0.1 11/15/2020 0831   BASOSABS 0.1 07/28/2019 1116    No results found for: "HGBA1C"  Assessment & Plan:  1. Pulmonary emphysema, unspecified emphysema type (HCC) Stable We will switch from [facility] and Stiolto to Trelegy for ease of administration Advised smoking cessation will be beneficial but he is not ready to quit at this time - albuterol (VENTOLIN HFA) 108 (90 Base) MCG/ACT inhaler; Inhale 2 puffs into the lungs every 4 (four) hours as needed for wheezing or shortness of breath.  Dispense: 8.5 g; Refill: 6 - Basic Metabolic Panel - Fluticasone-Umeclidin-Vilant (TRELEGY ELLIPTA) 100-62.5-25 MCG/ACT AEPB; Inhale 1 puff into the lungs daily.  Dispense: 1 each; Refill: 6  2. Tobacco abuse Spent 3 minutes counseling on smoking cessation and hazardous effect of smoking but he is not ready to quit at this time - buPROPion (WELLBUTRIN XL) 150 MG 24  hr tablet; Take 1 tablet (150 mg total) by mouth daily. For smoking cessation  Dispense: 30 tablet; Refill: 6  3. Other insomnia Controlled - traZODone (DESYREL) 50 MG tablet; Take 1 tablet (50 mg total) by mouth at bedtime as needed for sleep.  Dispense: 30 tablet; Refill: 6  4. Lower urinary tract symptoms Stable - tamsulosin (FLOMAX) 0.4 MG CAPS capsule; Take 1 capsule (0.4 mg total) by mouth daily.  Dispense: 30 capsule; Refill: 6  5. Lung nodules Due for repeat super D CT chest per pulmonary notes He has not followed up with pulmonary since 03/2021 We have provided him with the number to call to get an appointment  6. Need for hepatitis C screening test - HCV Ab w Reflex to Quant PCR  7. Screening for colon cancer - Fecal occult blood, immunochemical  8.  Musculoskeletal back pain Controlled on Tylenol and Aleve Advised to apply heat or ice whichever is tolerated to painful areas. Counseled on evidence of improvement in pain control with regards to yoga, water aerobics, massage, home physical therapy, exercise as tolerated.   Meds ordered this encounter  Medications   albuterol (VENTOLIN HFA) 108 (90 Base) MCG/ACT inhaler    Sig: Inhale 2 puffs into the lungs every 4 (four) hours as needed for wheezing or shortness of breath.    Dispense:  8.5 g    Refill:  6   buPROPion (WELLBUTRIN XL) 150 MG 24 hr tablet  Sig: Take 1 tablet (150 mg total) by mouth daily. For smoking cessation    Dispense:  30 tablet    Refill:  6   traZODone (DESYREL) 50 MG tablet    Sig: Take 1 tablet (50 mg total) by mouth at bedtime as needed for sleep.    Dispense:  30 tablet    Refill:  6   tamsulosin (FLOMAX) 0.4 MG CAPS capsule    Sig: Take 1 capsule (0.4 mg total) by mouth daily.    Dispense:  30 capsule    Refill:  6   Fluticasone-Umeclidin-Vilant (TRELEGY ELLIPTA) 100-62.5-25 MCG/ACT AEPB    Sig: Inhale 1 puff into the lungs daily.    Dispense:  1 each    Refill:  6    Discontinue  Dulera, Stiolto    Follow-up: Return in about 6 months (around 06/07/2022) for Chronic medical conditions.       [name], MD, FAAFP. [facility] [place], [place] [phone]   12/05/2021, 3:51 PM

## 2021-12-06 LAB — BASIC METABOLIC PANEL
BUN/Creatinine Ratio: 11 (ref 10–24)
BUN: 10 mg/dL (ref 8–27)
CO2: 26 mmol/L (ref 20–29)
Calcium: 9.7 mg/dL (ref 8.6–10.2)
Chloride: 102 mmol/L (ref 96–106)
Creatinine, Ser: 0.94 mg/dL (ref 0.76–1.27)
Glucose: 83 mg/dL (ref 70–99)
Potassium: 4.6 mmol/L (ref 3.5–5.2)
Sodium: 142 mmol/L (ref 134–144)
eGFR: 90 mL/min/{1.73_m2} (ref >59)

## 2021-12-06 LAB — HCV AB W REFLEX TO QUANT PCR: HCV Ab: NONREACTIVE

## 2021-12-06 LAB — HCV INTERPRETATION

## 2021-12-07 ENCOUNTER — Other Ambulatory Visit: Payer: Self-pay

## 2021-12-17 ENCOUNTER — Other Ambulatory Visit: Payer: Self-pay

## 2021-12-25 ENCOUNTER — Other Ambulatory Visit: Payer: Self-pay

## 2022-01-09 ENCOUNTER — Encounter: Payer: Self-pay | Admitting: Emergency Medicine

## 2022-01-09 ENCOUNTER — Ambulatory Visit (INDEPENDENT_AMBULATORY_CARE_PROVIDER_SITE_OTHER): Payer: Self-pay | Admitting: Emergency Medicine

## 2022-01-09 DIAGNOSIS — R918 Other nonspecific abnormal finding of lung field: Secondary | ICD-10-CM

## 2022-01-09 DIAGNOSIS — Z720 Tobacco use: Secondary | ICD-10-CM

## 2022-01-09 DIAGNOSIS — J439 Emphysema, unspecified: Secondary | ICD-10-CM

## 2022-01-09 NOTE — Assessment & Plan Note (Signed)
These have principally been waxing and waning, consistent with an inflammatory process.  The 1.3 cm right lower lobe nodule is slightly smaller but is hypermetabolic.  There is a new nodule in the anterior right upper lobe.  The other nodules are either stable or resolved.  Given his tobacco history the still need to be followed closely.  He is due for a surveillance CT now and we will arrange for this.  Follow-up after performed so we can review.  Depending on results we will decide whether diagnostics are indicated.

## 2022-01-09 NOTE — Progress Notes (Signed)
Subjective:    Patient ID: Marcus Pace, male    DOB: [birth year], 66 y.o.   MRN: [id number]  HPI  ROV 03/23/21 --66 year old gentleman with a history of tobacco use (50 pack years), continues to smoke, and abnormal CT chest after admission for a lung abscess and pneumonia, associated small segmental PE.  His chest imaging improved, consistent with abscess that have been adequately treated.  Admitted with COPD exacerbation 11/15/2020 and a CT chest was done as below.  Follow-up performed 03/01/2021 as below Currently managed on Dulera.  Uses albuterol about once a day. Still has exertional SOB. He has some intermittent cough, occasionally productive of clear.   CT chest 03/01/2021 reviewed by me, shows a new 1.3 x 1.5 cm right upper lobe nodule, new 5 to 6 mm right upper lobe nodule as well as increase in size on 8 x 11 nodule in the superior segment of the right lower lobe.  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  ROV 01/09/22 --Mr. Andria Meuse is 80 with history of tobacco use, continues smoke approximately 4 cigarettes a day (50 pack years).  I saw him after he was hospitalized for pneumonia with lung abscess and small segmental pulmonary emboli.  Subsequent CT showed improvement in the large right basilar abscess, now with bullous disease, right lower lobe superior segmental, right upper lobe nodules that were new.  Repeated in February 2023 as below.  I tried starting him on Stiolto in November 2020 to see if you get benefit.  He reports that the stiolto did seem to help his SOB. He has since been changed to trelegy and believes it is helping him.   CT/PET scan 07/02/2021 reviewed by me confirmed pulmonary nodules with a waxing and waning appearance, resolution of some of these.  New 1 cm nodule in the anterior right upper lobe.  The index right lower lobe 1.3 x 0.9 cm nodule is stable back to 11/15/2020 and is hypermetabolic.   Review of  Systems  Past Medical History:  Diagnosis Date   Arthritis    Bacteremia    2016 or so with hospitalization   Emphysema of lung (HCC) 07/20/2019   Inguinal hernia recurrent unilateral    herniation of mesentary and loops of bowel to right scrotal sac, 4.5 cm defect   Lesion of right lung 07/20/2019   Lumbar radiculopathy 02/15/2021   Pulmonary emboli (HCC) 07/20/2019     Family History  Problem Relation Age of Onset   Cancer Mother    Heart disease Father      Social History   Socioeconomic History   Marital status: Single    Spouse name: Not on file   Number of children: Not on file   Years of education: Not on file   Highest education level: Not on file  Occupational History   Not on file  Tobacco Use   Smoking status: Every Day    Packs/day: 1.00    Types: Cigarettes   Smokeless tobacco: Never   Tobacco comments:    4 cigarettes smoked daily [facility] 01/09/22  Substance and Sexual Activity   Alcohol use: Yes   Drug use: Never   Sexual activity: Not Currently  Other Topics Concern   Not on file  Social History Narrative   Lives with sister and grown nephew   Social Determinants of Health   Financial Resource Strain: Not on file  Food Insecurity: Not on file  Transportation Needs: Not on file  Physical Activity: Not on file  Stress: Not on file  Social Connections: Not on file  Intimate Partner Violence: Not on file    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  He may have been exposed to TB as a child.   No Known Allergies   Outpatient Medications Prior to Visit  Medication Sig Dispense Refill   albuterol (VENTOLIN HFA) 108 (90 Base) MCG/ACT inhaler Inhale 2 puffs into the lungs every 4 (four) hours as needed for wheezing or shortness of breath. 8.5 g 6   buPROPion (WELLBUTRIN XL) 150 MG 24 hr tablet Take 1 tablet (150 mg total) by mouth daily. For smoking cessation 30 tablet 6   Fluticasone-Umeclidin-Vilant (TRELEGY ELLIPTA)  100-62.5-25 MCG/ACT AEPB Inhale 1 puff into the lungs daily. 60 each 6   melatonin 3 MG TABS tablet Take 1 tablet (3 mg total) by mouth at bedtime. 30 tablet 3   Multiple Vitamin (MULTIVITAMIN WITH MINERALS) TABS tablet Take 1 tablet by mouth daily. (Patient not taking: Reported on 01/09/2022) 30 tablet 0   tamsulosin (FLOMAX) 0.4 MG CAPS capsule Take 1 capsule (0.4 mg total) by mouth daily. (Patient not taking: Reported on 01/09/2022) 30 capsule 6   thiamine 100 MG tablet Take 1 tablet (100 mg total) by mouth daily. (Patient not taking: Reported on 01/09/2022) 30 tablet 0   traZODone (DESYREL) 50 MG tablet Take 1 tablet (50 mg total) by mouth at bedtime as needed for sleep. (Patient not taking: Reported on 01/09/2022) 30 tablet 6   No facility-administered medications prior to visit.        Objective:   Physical Exam  Vitals:   01/09/22 1115  BP: 130/70  Pulse: 98  Temp: 98.7 F (37.1 C)  TempSrc: Oral  SpO2: 96%  Weight: 134 lb 9.6 oz (61.1 kg)  Height: 6' (1.829 m)    Gen: Pleasant, thin, in no distress,  normal affect  ENT: No lesions,  mouth clear,  oropharynx clear, no postnasal drip  Neck: No JVD, no stridor  Lungs: No use of accessory muscles, no crackles or wheezing on normal respiration, no wheeze on forced expiration  Cardiovascular: RRR, heart sounds normal, no murmur or gallops, no peripheral edema  Musculoskeletal: No deformities, no cyanosis or clubbing  Neuro: alert, awake, non focal  Skin: Warm, no lesions or rash      Assessment & Plan:  Pulmonary nodules These have principally been waxing and waning, consistent with an inflammatory process.  The 1.3 cm right lower lobe nodule is slightly smaller but is hypermetabolic.  There is a new nodule in the anterior right upper lobe.  The other nodules are either stable or resolved.  Given his tobacco history the still need to be followed closely.  He is due for a surveillance CT now and we will arrange for this.   Follow-up after performed so we can review.  Depending on results we will decide whether diagnostics are indicated.  Emphysema of lung (HCC) COPD.  He did benefit from [facility] and has since been changed to Trelegy prescribed by health and wellness.  He seems to be tolerating and benefiting.  Plan to continue.  Albuterol as needed  Tobacco abuse Discussed cessation with him today.  He has cut down.  He is unsure that he can set a quit date at this time.  Levy Pupa, MD, PhD 01/09/2022, 11:42 AM [facility] [phone] or if  no answer [phone]

## 2022-01-09 NOTE — Addendum Note (Signed)
Addended by: Dorisann Frames R on: 01/09/2022 11:59 AM   Modules accepted: Orders

## 2022-01-09 NOTE — Assessment & Plan Note (Signed)
COPD.  He did benefit from [facility] and has since been changed to Trelegy prescribed by health and wellness.  He seems to be tolerating and benefiting.  Plan to continue.  Albuterol as needed

## 2022-01-09 NOTE — Patient Instructions (Signed)
Please continue Trelegy 1 inhalation once daily.  Rinse and gargle after you use it. Keep albuterol available to use 2 puffs up to every 4 hours if needed for shortness of breath, chest tightness, wheezing. We reviewed your CT/PET scan from February today. We will plan to repeat a CT scan of your chest without contrast now to follow pulmonary nodules. Follow with [name] next available after your CT so we can review the results together.

## 2022-01-09 NOTE — Assessment & Plan Note (Signed)
Discussed cessation with him today.  He has cut down.  He is unsure that he can set a quit date at this time.

## 2022-01-17 ENCOUNTER — Ambulatory Visit (HOSPITAL_BASED_OUTPATIENT_CLINIC_OR_DEPARTMENT_OTHER)
Admission: RE | Admit: 2022-01-17 | Discharge: 2022-01-17 | Disposition: A | Discharge status: Home / Self Care | Payer: Self-pay | Source: Ambulatory Visit | Attending: Emergency Medicine | Admitting: Emergency Medicine

## 2022-01-17 ENCOUNTER — Encounter (HOSPITAL_BASED_OUTPATIENT_CLINIC_OR_DEPARTMENT_OTHER): Payer: Self-pay

## 2022-01-17 DIAGNOSIS — R918 Other nonspecific abnormal finding of lung field: Secondary | ICD-10-CM | POA: Insufficient documentation

## 2022-02-20 ENCOUNTER — Other Ambulatory Visit: Payer: Self-pay

## 2022-02-20 ENCOUNTER — Encounter: Payer: Self-pay | Admitting: Emergency Medicine

## 2022-02-20 ENCOUNTER — Ambulatory Visit (INDEPENDENT_AMBULATORY_CARE_PROVIDER_SITE_OTHER): Payer: Self-pay | Admitting: Emergency Medicine

## 2022-02-20 VITALS — BP 120/70 | HR 93 | Ht 72.0 in | Wt 157.2 lb

## 2022-02-20 DIAGNOSIS — Z720 Tobacco use: Secondary | ICD-10-CM

## 2022-02-20 DIAGNOSIS — J439 Emphysema, unspecified: Secondary | ICD-10-CM

## 2022-02-20 DIAGNOSIS — R918 Other nonspecific abnormal finding of lung field: Secondary | ICD-10-CM

## 2022-02-20 DIAGNOSIS — Z23 Encounter for immunization: Secondary | ICD-10-CM

## 2022-02-20 NOTE — Assessment & Plan Note (Signed)
Waxing and waning pulmonary nodules.  None that has grown and increased suspicion for malignancy although index nodule in the right lower lobe was hypermetabolic on PET scan.  We will continue to follow, neck scan in 6 months.  Low threshold to arrange for navigational bronchoscopy if any interval change.

## 2022-02-20 NOTE — Progress Notes (Signed)
Subjective:    Patient ID: Marcus Pace, male    DOB: 03-20-1956, 66 y.o.   MRN: [id number]  HPI  ROV 03/23/21 --66 year old gentleman with a history of tobacco use (50 pack years), continues to smoke, and abnormal CT chest after admission for a lung abscess and pneumonia, associated small segmental PE.  His chest imaging improved, consistent with abscess that have been adequately treated.  Admitted with COPD exacerbation 11/15/2020 and a CT chest was done as below.  Follow-up performed 03/01/2021 as below Currently managed on Dulera.  Uses albuterol about once a day. Still has exertional SOB. He has some intermittent cough, occasionally productive of clear.   CT chest 03/01/2021 reviewed by me, shows a new 1.3 x 1.5 cm right upper lobe nodule, new 5 to 6 mm right upper lobe nodule as well as increase in size on 8 x 11 nodule in the superior segment of the right lower lobe.  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  ROV 01/09/22 --[name] is 4 with history of tobacco use, continues smoke approximately 4 cigarettes a day (50 pack years).  I saw him after he was hospitalized for pneumonia with lung abscess and small segmental pulmonary emboli.  Subsequent CT showed improvement in the large right basilar abscess, now with bullous disease, right lower lobe superior segmental, right upper lobe nodules that were new.  Repeated in February 2023 as below.  I tried starting him on Stiolto in November 2020 to see if you get benefit.  He reports that the stiolto did seem to help his SOB. He has since been changed to trelegy and believes it is helping him.   CT/PET scan 07/02/2021 reviewed by me confirmed pulmonary nodules with a waxing and waning appearance, resolution of some of these.  New 1 cm nodule in the anterior right upper lobe.  The index right lower lobe 1.3 x 0.9 cm nodule is stable back to 11/15/2020 and is hypermetabolic.  ROV 02/20/2022  --66 year old gentleman with a history of tobacco use (50 pack years), followed for an abnormal CT scan of the chest.  He had a small segmental pulmonary embolism and pneumonia with lung abscess.  We have followed serial CT scans and he is and waxing and waning pulmonary nodular disease including a new anterior right upper lobe 1 cm nodule on the most recent scan in February.  Repeat CT scan as below.  He has been managed on Trelegy and reports  CT chest 01/17/2022 reviewed by me, shows severe centrilobular emphysema with a posterior right lower lobe bleb, again waxing and waning pulmonary nodular disease with enlargement of 4 mm nodule in the right upper lobe but decrease in size of other adjacent right upper lobe nodules including the new nodule seen in February.  The indexed solid right lower lobe pulmonary nodule is stable in size 1.1 x 0.8 cm.   Review of Systems  Past Medical History:  Diagnosis Date   Arthritis    Bacteremia    2016 or so with hospitalization   Emphysema of lung ([place]) 07/20/2019   Inguinal hernia recurrent unilateral    herniation of mesentary and loops of bowel to right scrotal sac, 4.5 cm defect   Lesion of right lung 07/20/2019   Lumbar radiculopathy 02/15/2021   Pulmonary emboli ([place]) 07/20/2019     Family History  Problem Relation Age of Onset   Cancer Mother    Heart disease Father  Social History   Socioeconomic History   Marital status: Single    Spouse name: Not on file   Number of children: Not on file   Years of education: Not on file   Highest education level: Not on file  Occupational History   Not on file  Tobacco Use   Smoking status: Every Day    Packs/day: 1.00    Types: Cigarettes   Smokeless tobacco: Never   Tobacco comments:    4 cigarettes smoked daily PAP 02/20/2022  Substance and Sexual Activity   Alcohol use: Yes   Drug use: Never   Sexual activity: Not Currently  Other Topics Concern   Not on file  Social History  Narrative   Lives with sister and grown nephew   Social Determinants of Health   Financial Resource Strain: Not on file  Food Insecurity: Not on file  Transportation Needs: Not on file  Physical Activity: Not on file  Stress: Not on file  Social Connections: Not on file  Intimate Partner Violence: Not on file    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  He may have been exposed to TB as a child.   No Known Allergies   Outpatient Medications Prior to Visit  Medication Sig Dispense Refill   albuterol (VENTOLIN HFA) 108 (90 Base) MCG/ACT inhaler Inhale 2 puffs into the lungs every 4 (four) hours as needed for wheezing or shortness of breath. 8.5 g 6   buPROPion (WELLBUTRIN XL) 150 MG 24 hr tablet Take 1 tablet (150 mg total) by mouth daily. For smoking cessation 30 tablet 6   Fluticasone-Umeclidin-Vilant (TRELEGY ELLIPTA) 100-62.5-25 MCG/ACT AEPB Inhale 1 puff into the lungs daily. 60 each 6   melatonin 3 MG TABS tablet Take 1 tablet (3 mg total) by mouth at bedtime. 30 tablet 3   Multiple Vitamin (MULTIVITAMIN WITH MINERALS) TABS tablet Take 1 tablet by mouth daily. 30 tablet 0   tamsulosin (FLOMAX) 0.4 MG CAPS capsule Take 1 capsule (0.4 mg total) by mouth daily. 30 capsule 6   thiamine 100 MG tablet Take 1 tablet (100 mg total) by mouth daily. 30 tablet 0   traZODone (DESYREL) 50 MG tablet Take 1 tablet (50 mg total) by mouth at bedtime as needed for sleep. 30 tablet 6   No facility-administered medications prior to visit.        Objective:   Physical Exam  Vitals:   02/20/22 0858  BP: 120/70  Pulse: 93  SpO2: 95%  Weight: 157 lb 3.2 oz (71.3 kg)  Height: 6' (1.829 m)    Gen: Pleasant, thin, in no distress,  normal affect  ENT: No lesions,  mouth clear,  oropharynx clear, no postnasal drip  Neck: No JVD, no stridor  Lungs: No use of accessory muscles, no crackles or wheezing on normal respiration, no wheeze on forced  expiration  Cardiovascular: RRR, heart sounds normal, no murmur or gallops, no peripheral edema  Musculoskeletal: No deformities, no cyanosis or clubbing  Neuro: alert, awake, non focal  Skin: Warm, no lesions or rash      Assessment & Plan:  Pulmonary nodules Waxing and waning pulmonary nodules.  None that has grown and increased suspicion for malignancy although index nodule in the right lower lobe was hypermetabolic on PET scan.  We will continue to follow, neck scan in 6 months.  Low threshold to arrange for navigational bronchoscopy if any interval change.  Emphysema of lung (HCC) Benefiting from  Trelegy.  Plan to continue.  Continues albuterol as needed.  Flu shot today.  He is plan to get COVID shot this fall.  Tobacco abuse Discussed smoking cessation with him.  He is cut down to 4 cigarettes daily.  We will try to continue cut down.  Next visit we can start to try to talk about a quit date.  Levy Pupa, MD, PhD 02/20/2022, 9:23 AM [facility] [phone] or if no answer [phone]

## 2022-02-20 NOTE — Patient Instructions (Addendum)
We will plan to repeat your CT chest in March 2024.  Continue Trelegy once daily. Rinse and gargle after using  Keep albuterol available to use 2 puffs up to every 4 hours if needed for shortness of breath, chest tightness, wheezing.  Flu shot today You would benefit from getting the COVID shot this fall.  Congratulations on decreasing your cigarettes.  Continue to cut down.  Goal will be to stop altogether Follow with Dr Lamonte Sakai in March after your CT to review.

## 2022-02-20 NOTE — Addendum Note (Signed)
Addended by: Retia Passe on: 02/20/2022 09:25 AM   Modules accepted: Orders

## 2022-02-20 NOTE — Assessment & Plan Note (Signed)
Benefiting from [employer].  Plan to continue.  Continues albuterol as needed.  Flu shot today.  He is plan to get COVID shot this fall.

## 2022-02-20 NOTE — Assessment & Plan Note (Signed)
Discussed smoking cessation with him.  He is cut down to 4 cigarettes daily.  We will try to continue cut down.  Next visit we can start to try to talk about a quit date.

## 2022-06-03 ENCOUNTER — Other Ambulatory Visit: Payer: Self-pay

## 2022-06-12 ENCOUNTER — Ambulatory Visit: Payer: Self-pay | Attending: Family Medicine | Admitting: Family Medicine

## 2022-07-22 ENCOUNTER — Ambulatory Visit (HOSPITAL_COMMUNITY)
Admission: RE | Admit: 2022-07-22 | Discharge: 2022-07-22 | Disposition: A | Discharge status: Home / Self Care | Payer: Medicare Other | Source: Ambulatory Visit | Attending: Emergency Medicine | Admitting: Emergency Medicine

## 2022-07-22 DIAGNOSIS — R918 Other nonspecific abnormal finding of lung field: Secondary | ICD-10-CM | POA: Diagnosis present

## 2022-08-28 ENCOUNTER — Other Ambulatory Visit: Payer: Self-pay

## 2022-10-15 ENCOUNTER — Other Ambulatory Visit: Payer: Self-pay

## 2022-11-29 ENCOUNTER — Other Ambulatory Visit: Payer: Self-pay

## 2022-12-04 ENCOUNTER — Encounter: Payer: Self-pay | Admitting: Emergency Medicine

## 2022-12-04 ENCOUNTER — Ambulatory Visit: Payer: Medicare HMO | Admitting: Emergency Medicine

## 2022-12-04 ENCOUNTER — Other Ambulatory Visit: Payer: Self-pay

## 2022-12-04 VITALS — BP 130/78 | HR 80 | Temp 97.4°F | Ht 73.0 in | Wt 134.0 lb

## 2022-12-04 DIAGNOSIS — R918 Other nonspecific abnormal finding of lung field: Secondary | ICD-10-CM | POA: Diagnosis not present

## 2022-12-04 DIAGNOSIS — J439 Emphysema, unspecified: Secondary | ICD-10-CM | POA: Diagnosis not present

## 2022-12-04 NOTE — Assessment & Plan Note (Signed)
His pulmonary nodules are slowly enlarging.  Discussed this with him today.  I recommended navigational bronchoscopy and he agrees.  We will repeat a super D CT to have up-to-date imaging.  I will try to get this done for mid August.

## 2022-12-04 NOTE — H&P (View-Only) (Signed)
 Subjective:    Patient ID: Marcus Pace, male    DOB: 07-31-1955, 67 y.o.   MRN: [id number]  HPI  ROV 02/20/2022 --67 year old gentleman with a history of tobacco use (50 pack years), followed for an abnormal CT scan of the chest.  He had a small segmental pulmonary embolism and pneumonia with lung abscess.  We have followed serial CT scans and he is and waxing and waning pulmonary nodular disease including a new anterior right upper lobe 1 cm nodule on the most recent scan in February.  Repeat CT scan as below.  He has been managed on Trelegy and reports  CT chest 01/17/2022 reviewed by me, shows severe centrilobular emphysema with a posterior right lower lobe bleb, again waxing and waning pulmonary nodular disease with enlargement of 4 mm nodule in the right upper lobe but decrease in size of other adjacent right upper lobe nodules including the new nodule seen in February.  The indexed solid right lower lobe pulmonary nodule is stable in size 1.1 x 0.8 cm.  ROV 12/04/22 --follow-up visit for 67 year old man history of active tobacco use (50 pack years), abnormal CT chest following hospitalization for pulmonary nodule, small segmental pulmonary emboli and a pneumonia with lung abscess.  We have been following serial imaging.  The right lower lobe nodule has been stable in size but did show hypermetabolism on PET scan.  Currently treated with Trelegy. Uses albuterol about every other day. He has been having more SOB in the hot weather. Some cough, especially in am - comes and goes. Mucous production in the am. No flares.   CT chest 07/22/2022 reviewed by me shows no mediastinal or hilar adenopathy, 12 x 10 mm right lower lobe pulmonary nodule (slightly enlarged), enlarging nodule associated with cystic and solid changes in the right lower lobe, new 12 mm right upper lobe nodule adjacent to pleura     Review of Systems  Past Medical History:  Diagnosis Date   Arthritis    Bacteremia     2016 or so with hospitalization   Emphysema of lung (HCC) 07/20/2019   Inguinal hernia recurrent unilateral    herniation of mesentary and loops of bowel to right scrotal sac, 4.5 cm defect   Lesion of right lung 07/20/2019   Lumbar radiculopathy 02/15/2021   Pulmonary emboli (HCC) 07/20/2019     Family History  Problem Relation Age of Onset   Cancer Mother    Heart disease Father      Social History   Socioeconomic History   Marital status: Single    Spouse name: Not on file   Number of children: Not on file   Years of education: Not on file   Highest education level: Not on file  Occupational History   Not on file  Tobacco Use   Smoking status: Some Days    Current packs/day: 1.00    Types: Cigarettes   Smokeless tobacco: Never   Tobacco comments:    4 cigarettes smoked daily PAP 02/20/2022  Substance and Sexual Activity   Alcohol use: Yes   Drug use: Never   Sexual activity: Not Currently  Other Topics Concern   Not on file  Social History Narrative   Lives with sister and grown nephew   Social Determinants of Health   Financial Resource Strain: Not on file  Food Insecurity: Not on file  Transportation Needs: Not on file  Physical Activity: Not on file  Stress: Not on file  Social Connections:  Not on file  Intimate Partner Violence: Not on file    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  He may have been exposed to TB as a child.   No Known Allergies   Outpatient Medications Prior to Visit  Medication Sig Dispense Refill   albuterol (VENTOLIN HFA) 108 (90 Base) MCG/ACT inhaler Inhale 2 puffs into the lungs every 4 (four) hours as needed for wheezing or shortness of breath. 8.5 g 6   buPROPion (WELLBUTRIN XL) 150 MG 24 hr tablet Take 1 tablet (150 mg total) by mouth daily. For smoking cessation 30 tablet 6   Fluticasone-Umeclidin-Vilant (TRELEGY ELLIPTA) 100-62.5-25 MCG/ACT AEPB Inhale 1 puff into the lungs daily. 60 each 6    melatonin 3 MG TABS tablet Take 1 tablet (3 mg total) by mouth at bedtime. 30 tablet 3   Multiple Vitamin (MULTIVITAMIN WITH MINERALS) TABS tablet Take 1 tablet by mouth daily. 30 tablet 0   tamsulosin (FLOMAX) 0.4 MG CAPS capsule Take 1 capsule (0.4 mg total) by mouth daily. 30 capsule 6   thiamine 100 MG tablet Take 1 tablet (100 mg total) by mouth daily. 30 tablet 0   traZODone (DESYREL) 50 MG tablet Take 1 tablet (50 mg total) by mouth at bedtime as needed for sleep. 30 tablet 6   No facility-administered medications prior to visit.        Objective:   Physical Exam  Vitals:   12/04/22 0830  BP: 130/78  Pulse: 80  Temp: (!) 97.4 F (36.3 C)  TempSrc: Oral  SpO2: 97%  Weight: 134 lb (60.8 kg)  Height: 6\' 1"  (1.854 m)    Gen: Pleasant, thin, in no distress,  normal affect  ENT: No lesions,  mouth clear,  oropharynx clear, no postnasal drip  Neck: No JVD, no stridor  Lungs: No use of accessory muscles, no crackles or wheezing on normal respiration, no wheeze on forced expiration  Cardiovascular: RRR, heart sounds normal, no murmur or gallops, no peripheral edema  Musculoskeletal: No deformities, no cyanosis or clubbing  Neuro: alert, awake, non focal  Skin: Warm, no lesions or rash      Assessment & Plan:  Pulmonary nodules His pulmonary nodules are slowly enlarging.  Discussed this with him today.  I recommended navigational bronchoscopy and he agrees.  We will repeat a super D CT to have up-to-date imaging.  I will try to get this done for mid August.  Emphysema of lung (HCC) COPD overall stable.  No flares.  Plan to continue Trelegy and albuterol as needed.  Keep vaccines up-to-date this fall   Levy Pupa, MD, PhD 12/04/2022, 8:55 AM [facility] [phone] or if no answer [phone]

## 2022-12-04 NOTE — Assessment & Plan Note (Signed)
COPD overall stable.  No flares.  Plan to continue Trelegy and albuterol as needed.  Keep vaccines up-to-date this fall

## 2022-12-04 NOTE — Patient Instructions (Signed)
We reviewed your CT scan of the chest today. We will arrange for navigational bronchoscopy to evaluate enlarging nodules.  This will be done as an outpatient under general anesthesia at [facility] endoscopy.  You will need a designated driver and someone to watch you at home on that day after the procedure.  Will try to get this set up for mid August. We will perform a repeat CT scan of your chest to be used during the bronchoscopy Please continue Trelegy 1 elation once daily.  Rinse and gargle after using. Keep albuterol available to use 2 puffs up to every 4 hours if needed for shortness of breath, chest tightness, wheezing.  Follow with T Parrett in August after your planned procedure to review

## 2022-12-04 NOTE — Progress Notes (Signed)
Subjective:    Patient ID: Marcus Pace, male    DOB: 07-31-1955, 67 y.o.   MRN: [id number]  HPI  ROV 02/20/2022 --67 year old gentleman with a history of tobacco use (50 pack years), followed for an abnormal CT scan of the chest.  He had a small segmental pulmonary embolism and pneumonia with lung abscess.  We have followed serial CT scans and he is and waxing and waning pulmonary nodular disease including a new anterior right upper lobe 1 cm nodule on the most recent scan in February.  Repeat CT scan as below.  He has been managed on Trelegy and reports  CT chest 01/17/2022 reviewed by me, shows severe centrilobular emphysema with a posterior right lower lobe bleb, again waxing and waning pulmonary nodular disease with enlargement of 4 mm nodule in the right upper lobe but decrease in size of other adjacent right upper lobe nodules including the new nodule seen in February.  The indexed solid right lower lobe pulmonary nodule is stable in size 1.1 x 0.8 cm.  ROV 12/04/22 --follow-up visit for 67 year old man history of active tobacco use (50 pack years), abnormal CT chest following hospitalization for pulmonary nodule, small segmental pulmonary emboli and a pneumonia with lung abscess.  We have been following serial imaging.  The right lower lobe nodule has been stable in size but did show hypermetabolism on PET scan.  Currently treated with Trelegy. Uses albuterol about every other day. He has been having more SOB in the hot weather. Some cough, especially in am - comes and goes. Mucous production in the am. No flares.   CT chest 07/22/2022 reviewed by me shows no mediastinal or hilar adenopathy, 12 x 10 mm right lower lobe pulmonary nodule (slightly enlarged), enlarging nodule associated with cystic and solid changes in the right lower lobe, new 12 mm right upper lobe nodule adjacent to pleura     Review of Systems  Past Medical History:  Diagnosis Date   Arthritis    Bacteremia     2016 or so with hospitalization   Emphysema of lung (HCC) 07/20/2019   Inguinal hernia recurrent unilateral    herniation of mesentary and loops of bowel to right scrotal sac, 4.5 cm defect   Lesion of right lung 07/20/2019   Lumbar radiculopathy 02/15/2021   Pulmonary emboli (HCC) 07/20/2019     Family History  Problem Relation Age of Onset   Cancer Mother    Heart disease Father      Social History   Socioeconomic History   Marital status: Single    Spouse name: Not on file   Number of children: Not on file   Years of education: Not on file   Highest education level: Not on file  Occupational History   Not on file  Tobacco Use   Smoking status: Some Days    Current packs/day: 1.00    Types: Cigarettes   Smokeless tobacco: Never   Tobacco comments:    4 cigarettes smoked daily PAP 02/20/2022  Substance and Sexual Activity   Alcohol use: Yes   Drug use: Never   Sexual activity: Not Currently  Other Topics Concern   Not on file  Social History Narrative   Lives with sister and grown nephew   Social Determinants of Health   Financial Resource Strain: Not on file  Food Insecurity: Not on file  Transportation Needs: Not on file  Physical Activity: Not on file  Stress: Not on file  Social Connections:  Not on file  Intimate Partner Violence: Not on file    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  He may have been exposed to TB as a child.   No Known Allergies   Outpatient Medications Prior to Visit  Medication Sig Dispense Refill   albuterol (VENTOLIN HFA) 108 (90 Base) MCG/ACT inhaler Inhale 2 puffs into the lungs every 4 (four) hours as needed for wheezing or shortness of breath. 8.5 g 6   buPROPion (WELLBUTRIN XL) 150 MG 24 hr tablet Take 1 tablet (150 mg total) by mouth daily. For smoking cessation 30 tablet 6   Fluticasone-Umeclidin-Vilant (TRELEGY ELLIPTA) 100-62.5-25 MCG/ACT AEPB Inhale 1 puff into the lungs daily. 60 each 6    melatonin 3 MG TABS tablet Take 1 tablet (3 mg total) by mouth at bedtime. 30 tablet 3   Multiple Vitamin (MULTIVITAMIN WITH MINERALS) TABS tablet Take 1 tablet by mouth daily. 30 tablet 0   tamsulosin (FLOMAX) 0.4 MG CAPS capsule Take 1 capsule (0.4 mg total) by mouth daily. 30 capsule 6   thiamine 100 MG tablet Take 1 tablet (100 mg total) by mouth daily. 30 tablet 0   traZODone (DESYREL) 50 MG tablet Take 1 tablet (50 mg total) by mouth at bedtime as needed for sleep. 30 tablet 6   No facility-administered medications prior to visit.        Objective:   Physical Exam  Vitals:   12/04/22 0830  BP: 130/78  Pulse: 80  Temp: (!) 97.4 F (36.3 C)  TempSrc: Oral  SpO2: 97%  Weight: 134 lb (60.8 kg)  Height: 6\' 1"  (1.854 m)    Gen: Pleasant, thin, in no distress,  normal affect  ENT: No lesions,  mouth clear,  oropharynx clear, no postnasal drip  Neck: No JVD, no stridor  Lungs: No use of accessory muscles, no crackles or wheezing on normal respiration, no wheeze on forced expiration  Cardiovascular: RRR, heart sounds normal, no murmur or gallops, no peripheral edema  Musculoskeletal: No deformities, no cyanosis or clubbing  Neuro: alert, awake, non focal  Skin: Warm, no lesions or rash      Assessment & Plan:  Pulmonary nodules His pulmonary nodules are slowly enlarging.  Discussed this with him today.  I recommended navigational bronchoscopy and he agrees.  We will repeat a super D CT to have up-to-date imaging.  I will try to get this done for mid August.  Emphysema of lung (HCC) COPD overall stable.  No flares.  Plan to continue Trelegy and albuterol as needed.  Keep vaccines up-to-date this fall   Levy Pupa, MD, PhD 12/04/2022, 8:55 AM [facility] [phone] or if no answer [phone]

## 2022-12-12 ENCOUNTER — Other Ambulatory Visit: Payer: Self-pay

## 2022-12-12 ENCOUNTER — Encounter (HOSPITAL_COMMUNITY): Payer: Self-pay | Admitting: Emergency Medicine

## 2022-12-12 NOTE — Progress Notes (Signed)
SDW CALL  Patient was given pre-op instructions over the phone. The opportunity was given for the patient to ask questions. No further questions asked. Patient verbalized understanding of instructions given.   PCP - [name], MD Cardiologist - denies  PPM/ICD - denies Device Orders -  Rep Notified -   Chest x-ray - 07/22/22 EKG - 11/20/20 Stress Test - denies ECHO - denies Cardiac Cath - denies  Sleep Study - none CPAP - no  Fasting Blood Sugar - na Checks Blood Sugar _____ times a day  Blood Thinner Instructions:na Aspirin Instructions:na  ERAS Protcol -no PRE-SURGERY Ensure or G2-   COVID TEST- na   Anesthesia review: no  Patient denies shortness of breath, fever, cough and chest pain over the phone call   Surgical Instructions    Your procedure is scheduled on Monday August 12  Report to [facility] Main Entrance "A" at 1015 A.M., then check in with the Admitting office.  Call this number if you have problems the morning of surgery:  [phone]    Remember:  Do not eat or drink anything after midnight the night before your surgery   Take these medicines the morning of surgery with A SIP OF WATER:  Wellbutrin,Trelegy Ellipta,Flomax  PRN - Albuterol inhaler-bring to the hospital with you.    As of today, STOP taking any Aspirin (unless otherwise instructed by your surgeon) Aleve, Naproxen, Ibuprofen, Motrin, Advil, Goody's, BC's, all herbal medications, fish oil, and all vitamins.  [facility] is not responsible for any belongings or valuables. .   Do NOT Smoke (Tobacco/Vaping)  24 hours prior to your procedure  If you use a CPAP at night, you may bring your mask for your overnight stay.   Contacts, glasses, hearing aids, dentures or partials may not be worn into surgery, please bring cases for these belongings   Patients discharged the day of surgery will not be allowed to drive home, and someone needs to stay with them for 24 hours.  Special  instructions:    Oral Hygiene is also important to reduce your risk of infection.  Remember - BRUSH YOUR TEETH THE MORNING OF SURGERY WITH YOUR REGULAR TOOTHPASTE   Day of Surgery:  Take a shower the day of or night before with antibacterial soap. Wear Clean/Comfortable clothing the morning of surgery Do not apply any deodorants/lotions.   Do not wear jewelry or makeup Do not wear lotions, powders, perfumes/colognes, or deodorant. Do not shave 48 hours prior to surgery.  Men may shave face and neck. Do not bring valuables to the hospital. Do not wear nail polish, gel polish, artificial nails, or any other type of covering on natural nails (fingers and toes) If you have artificial nails or gel coating that need to be removed by a nail salon, please have this removed prior to surgery. Artificial nails or gel coating may interfere with anesthesia's ability to adequately monitor your vital signs. Remember to brush your teeth WITH YOUR REGULAR TOOTHPASTE.

## 2022-12-13 ENCOUNTER — Ambulatory Visit (HOSPITAL_COMMUNITY)
Admission: RE | Admit: 2022-12-13 | Discharge: 2022-12-13 | Disposition: A | Discharge status: Home / Self Care | Payer: Medicare HMO | Source: Ambulatory Visit | Attending: Emergency Medicine | Admitting: Emergency Medicine

## 2022-12-13 DIAGNOSIS — J439 Emphysema, unspecified: Secondary | ICD-10-CM | POA: Diagnosis not present

## 2022-12-13 DIAGNOSIS — R911 Solitary pulmonary nodule: Secondary | ICD-10-CM | POA: Diagnosis not present

## 2022-12-13 DIAGNOSIS — R918 Other nonspecific abnormal finding of lung field: Secondary | ICD-10-CM | POA: Diagnosis present

## 2022-12-13 NOTE — Progress Notes (Addendum)
Pt called to ask why does he need someone to stay with him after his procedure on Monday 12/16/22. Explained to the pt that the hospital policy for safety is that after a pt received anesthesia someone 18 and over needs to stay with them for at least 24 hours until the anesthesia wears off. Pt sts, "I asked my sister and other family members, but it seems no one wants to be bothered with me. I can stay at the hospital after my surgery. I don't need anyone to stay with me I'm 67 years old, I'm grown, and I can take care of myself! I'll just keep my cell phone next to me! Explained to the pt that again, it is not safe to send pts home after receiving anesthesia medicine. Also explained that having a cell phone next to him will not help him if he stops breathing or passes out. Also explained that he needs to call the surgeon's office to possibly reschedule his procedure and the pt hung up the phone while more information was being explained.  Message sent to [name] regarding this matter.

## 2022-12-16 ENCOUNTER — Other Ambulatory Visit: Payer: Self-pay

## 2022-12-16 ENCOUNTER — Ambulatory Visit (HOSPITAL_COMMUNITY): Payer: Medicare HMO

## 2022-12-16 ENCOUNTER — Ambulatory Visit (HOSPITAL_COMMUNITY)
Admission: RE | Admit: 2022-12-16 | Discharge: 2022-12-16 | Disposition: A | Discharge status: Home / Self Care | Payer: Medicare HMO | Source: Ambulatory Visit | Attending: Emergency Medicine | Admitting: Emergency Medicine

## 2022-12-16 ENCOUNTER — Encounter (HOSPITAL_COMMUNITY): Payer: Self-pay | Admitting: Emergency Medicine

## 2022-12-16 ENCOUNTER — Ambulatory Visit (HOSPITAL_COMMUNITY): Payer: Medicare HMO | Admitting: Anesthesiology

## 2022-12-16 ENCOUNTER — Ambulatory Visit (HOSPITAL_BASED_OUTPATIENT_CLINIC_OR_DEPARTMENT_OTHER): Payer: Medicare HMO | Admitting: Anesthesiology

## 2022-12-16 ENCOUNTER — Encounter (HOSPITAL_COMMUNITY)
Admission: RE | Disposition: A | Discharge status: Home / Self Care | Payer: Self-pay | Source: Ambulatory Visit | Attending: Emergency Medicine

## 2022-12-16 DIAGNOSIS — R918 Other nonspecific abnormal finding of lung field: Secondary | ICD-10-CM | POA: Diagnosis present

## 2022-12-16 DIAGNOSIS — Z7951 Long term (current) use of inhaled steroids: Secondary | ICD-10-CM | POA: Diagnosis not present

## 2022-12-16 DIAGNOSIS — J432 Centrilobular emphysema: Secondary | ICD-10-CM | POA: Insufficient documentation

## 2022-12-16 DIAGNOSIS — F17210 Nicotine dependence, cigarettes, uncomplicated: Secondary | ICD-10-CM

## 2022-12-16 DIAGNOSIS — J439 Emphysema, unspecified: Secondary | ICD-10-CM

## 2022-12-16 DIAGNOSIS — R911 Solitary pulmonary nodule: Secondary | ICD-10-CM

## 2022-12-16 DIAGNOSIS — C3431 Malignant neoplasm of lower lobe, right bronchus or lung: Secondary | ICD-10-CM | POA: Insufficient documentation

## 2022-12-16 DIAGNOSIS — Z86711 Personal history of pulmonary embolism: Secondary | ICD-10-CM | POA: Insufficient documentation

## 2022-12-16 HISTORY — PX: BRONCHIAL BRUSHINGS: SHX5108

## 2022-12-16 HISTORY — PX: BRONCHIAL BIOPSY: SHX5109

## 2022-12-16 HISTORY — PX: BRONCHIAL NEEDLE ASPIRATION BIOPSY: SHX5106

## 2022-12-16 HISTORY — PX: FIDUCIAL MARKER PLACEMENT: SHX6858

## 2022-12-16 LAB — CBC
HCT: 48.5 % (ref 39.0–52.0)
Hemoglobin: 16.5 g/dL (ref 13.0–17.0)
MCH: 30.1 pg (ref 26.0–34.0)
MCHC: 34 g/dL (ref 30.0–36.0)
MCV: 88.3 fL (ref 80.0–100.0)
Platelets: 273 10*3/uL (ref 150–400)
RBC: 5.49 MIL/uL (ref 4.22–5.81)
RDW: 13.6 % (ref 11.5–15.5)
WBC: 6.3 10*3/uL (ref 4.0–10.5)
nRBC: 0 % (ref 0.0–0.2)

## 2022-12-16 SURGERY — BRONCHOSCOPY, WITH BIOPSY USING ELECTROMAGNETIC NAVIGATION
Anesthesia: General

## 2022-12-16 MED ORDER — ACETAMINOPHEN 500 MG PO TABS
ORAL_TABLET | ORAL | Status: AC
  Administered 2022-12-16: 1000 mg via ORAL
  Filled 2022-12-16: qty 2

## 2022-12-16 MED ORDER — OXYCODONE HCL 5 MG PO TABS: 5.0000 mg | ORAL_TABLET | Freq: Once | ORAL | Status: DC | PRN

## 2022-12-16 MED ORDER — PHENYLEPHRINE 80 MCG/ML (10ML) SYRINGE FOR IV PUSH (FOR BLOOD PRESSURE SUPPORT)
PREFILLED_SYRINGE | INTRAVENOUS | Status: DC | PRN
  Administered 2022-12-16: 160 ug via INTRAVENOUS

## 2022-12-16 MED ORDER — PROPOFOL 500 MG/50ML IV EMUL
INTRAVENOUS | Status: DC | PRN
  Administered 2022-12-16: 150 ug/kg/min via INTRAVENOUS

## 2022-12-16 MED ORDER — FENTANYL CITRATE (PF) 100 MCG/2ML IJ SOLN: 25.0000 ug | INTRAMUSCULAR | Status: DC | PRN

## 2022-12-16 MED ORDER — OXYCODONE HCL 5 MG/5ML PO SOLN: 5.0000 mg | Freq: Once | ORAL | Status: DC | PRN

## 2022-12-16 MED ORDER — LACTATED RINGERS IV SOLN: INTRAVENOUS | Status: DC

## 2022-12-16 MED ORDER — ACETAMINOPHEN 500 MG PO TABS: 1000.0000 mg | ORAL_TABLET | Freq: Once | ORAL | Status: AC

## 2022-12-16 MED ORDER — ONDANSETRON HCL 4 MG/2ML IJ SOLN
INTRAMUSCULAR | Status: DC | PRN
  Administered 2022-12-16: 4 mg via INTRAVENOUS

## 2022-12-16 MED ORDER — ROCURONIUM BROMIDE 10 MG/ML (PF) SYRINGE
PREFILLED_SYRINGE | INTRAVENOUS | Status: DC | PRN
  Administered 2022-12-16: 50 mg via INTRAVENOUS
  Administered 2022-12-16: 20 mg via INTRAVENOUS

## 2022-12-16 MED ORDER — PROPOFOL 10 MG/ML IV BOLUS
INTRAVENOUS | Status: DC | PRN
Start: 2022-12-16 — End: 2022-12-16
  Administered 2022-12-16: 200 mg via INTRAVENOUS

## 2022-12-16 MED ORDER — SUGAMMADEX SODIUM 200 MG/2ML IV SOLN
INTRAVENOUS | Status: DC | PRN
  Administered 2022-12-16: 130 mg via INTRAVENOUS

## 2022-12-16 MED ORDER — LIDOCAINE 2% (20 MG/ML) 5 ML SYRINGE
INTRAMUSCULAR | Status: DC | PRN
  Administered 2022-12-16 (×2): 40 mg via INTRAVENOUS

## 2022-12-16 MED ORDER — DEXAMETHASONE SODIUM PHOSPHATE 10 MG/ML IJ SOLN
INTRAMUSCULAR | Status: DC | PRN
  Administered 2022-12-16: 10 mg via INTRAVENOUS

## 2022-12-16 MED ORDER — PHENYLEPHRINE HCL-NACL 20-0.9 MG/250ML-% IV SOLN
INTRAVENOUS | Status: DC | PRN
  Administered 2022-12-16: 40 ug/min via INTRAVENOUS

## 2022-12-16 MED ORDER — CHLORHEXIDINE GLUCONATE 0.12 % MT SOLN
OROMUCOSAL | Status: AC
  Administered 2022-12-16: 15 mL via OROMUCOSAL
  Filled 2022-12-16: qty 15

## 2022-12-16 MED ORDER — FENTANYL CITRATE (PF) 100 MCG/2ML IJ SOLN
INTRAMUSCULAR | Status: DC | PRN
  Administered 2022-12-16: 50 ug via INTRAVENOUS

## 2022-12-16 MED ORDER — CHLORHEXIDINE GLUCONATE 0.12 % MT SOLN: 15.0000 mL | Freq: Once | OROMUCOSAL | Status: AC

## 2022-12-16 MED ORDER — ONDANSETRON HCL 4 MG/2ML IJ SOLN: 4.0000 mg | Freq: Once | INTRAMUSCULAR | Status: DC | PRN

## 2022-12-16 SURGICAL SUPPLY — 1 items: superlock fiducial IMPLANT

## 2022-12-16 NOTE — Anesthesia Procedure Notes (Signed)
Procedure Name: Intubation Date/Time: 12/16/2022 12:55 PM  Performed by: Samara Deist, CRNAPre-anesthesia Checklist: Patient identified, Emergency Drugs available, Suction available and Patient being monitored Patient Re-evaluated:Patient Re-evaluated prior to induction Oxygen Delivery Method: Circle System Utilized Preoxygenation: Pre-oxygenation with 100% oxygen Induction Type: IV induction Ventilation: Mask ventilation without difficulty Laryngoscope Size: Mac and 4 Grade View: Grade I Tube type: Oral Tube size: 8.5 mm Number of attempts: 1 Airway Equipment and Method: Stylet and LTA kit utilized Placement Confirmation: ETT inserted through vocal cords under direct vision, positive ETCO2 and breath sounds checked- equal and bilateral Secured at: 23 cm Tube secured with: Tape Dental Injury: Teeth and Oropharynx as per pre-operative assessment

## 2022-12-16 NOTE — Discharge Instructions (Signed)
Flexible Bronchoscopy, Care After This sheet gives you information about how to care for yourself after your test. Your doctor may also give you more specific instructions. If you have problems or questions, contact your doctor. Follow these instructions at home: Eating and drinking When your numbness is gone and your cough and gag reflexes have come back, you may: Eat only soft foods. Slowly drink liquids. The day after the test, go back to your normal diet. Driving Do not drive for 24 hours if you were given a medicine to help you relax (sedative). Do not drive or use heavy machinery while taking prescription pain medicine. General instructions  Take over-the-counter and prescription medicines only as told by your doctor. Return to your normal activities as told. Ask what activities are safe for you. Do not use any products that have nicotine or tobacco in them. This includes cigarettes and e-cigarettes. If you need help quitting, ask your doctor. Keep all follow-up visits as told by your doctor. This is important. It is very important if you had a tissue sample (biopsy) taken. Get help right away if: You have shortness of breath that gets worse. You get light-headed. You feel like you are going to pass out (faint). You have chest pain. You cough up: More than a little blood. More blood than before. Summary Do not eat or drink anything (not even water) for 2 hours after your test, or until your numbing medicine wears off. Do not use cigarettes. Do not use e-cigarettes. Get help right away if you have chest pain.  Please call our office for any questions or concerns.  [phone].  This information is not intended to replace advice given to you by your health care provider. Make sure you discuss any questions you have with your health care provider. Document Released: 02/17/2009 Document Revised: 04/04/2017 Document Reviewed: 05/10/2016 Elsevier Patient Education  2020 Elsevier  Inc.  

## 2022-12-16 NOTE — Anesthesia Preprocedure Evaluation (Addendum)
Anesthesia Evaluation  Patient identified by MRN, date of birth, ID band Patient awake    Reviewed: Allergy & Precautions, NPO status , Patient's Chart, lab work & pertinent test results  History of Anesthesia Complications Negative for: history of anesthetic complications  Airway Mallampati: II  TM Distance: >3 FB Neck ROM: Full    Dental  (+) Dental Advisory Given, Chipped   Pulmonary COPD,  COPD inhaler, Current Smoker and Patient abstained from smoking., PE   Pulmonary exam normal        Cardiovascular negative cardio ROS Normal cardiovascular exam     Neuro/Psych negative neurological ROS  negative psych ROS   GI/Hepatic negative GI ROS, Neg liver ROS,,,  Endo/Other  negative endocrine ROS    Renal/GU negative Renal ROS     Musculoskeletal  (+) Arthritis ,    Abdominal   Peds  Hematology negative hematology ROS (+)   Anesthesia Other Findings   Reproductive/Obstetrics                             Anesthesia Physical Anesthesia Plan  ASA: 3  Anesthesia Plan: General   Post-op Pain Management: Tylenol PO (pre-op)* and Minimal or no pain anticipated   Induction: Intravenous  PONV Risk Score and Plan: 1 and Treatment may vary due to age or medical condition, Ondansetron and Dexamethasone  Airway Management Planned: Oral ETT  Additional Equipment: None  Intra-op Plan:   Post-operative Plan: Extubation in OR  Informed Consent: I have reviewed the patients History and Physical, chart, labs and discussed the procedure including the risks, benefits and alternatives for the proposed anesthesia with the patient or authorized representative who has indicated his/her understanding and acceptance.     Dental advisory given  Plan Discussed with: CRNA and Anesthesiologist  Anesthesia Plan Comments:        Anesthesia Quick Evaluation

## 2022-12-16 NOTE — Op Note (Signed)
Video Bronchoscopy with Robotic Assisted Bronchoscopic Navigation   Date of Operation: 12/16/2022   Pre-op Diagnosis: Right lower lobe pulmonary nodules  Post-op Diagnosis: Same  Surgeon: Levy Pupa  Assistants: None  Anesthesia: General endotracheal anesthesia  Operation: Flexible video fiberoptic bronchoscopy with robotic assistance and biopsies.  Estimated Blood Loss: Minimal  Complications: None  Indications and History: Marcus Pace is a 67 y.o. male with history of tobacco use and COPD.  He has slowly enlarging right lower lobe pulmonary nodules that been followed on serial CT scans of the chest.  Recommendation made to achieve a tissue diagnosis via robotic assisted navigational bronchoscopy. The risks, benefits, complications, treatment options and expected outcomes were discussed with the patient.  The possibilities of pneumothorax, pneumonia, reaction to medication, pulmonary aspiration, perforation of a viscus, bleeding, failure to diagnose a condition and creating a complication requiring transfusion or operation were discussed with the patient who freely signed the consent.    Description of Procedure: The patient was seen in the Preoperative Area, was examined and was deemed appropriate to proceed.  The patient was taken to [facility] endoscopy room 3, identified as Marcus Pace and the procedure verified as Flexible Video Fiberoptic Bronchoscopy.  A Time Out was held and the above information confirmed.   Prior to the date of the procedure a high-resolution CT scan of the chest was performed. Utilizing ION software program a virtual tracheobronchial tree was generated to allow the creation of distinct navigation pathways to the patient's parenchymal abnormalities. After being taken to the operating room general anesthesia was initiated and the patient  was orally intubated. The video fiberoptic bronchoscope was introduced via the endotracheal tube and a general inspection  was performed which showed normal right and left lung anatomy. Aspiration of the bilateral mainstems was completed to remove any remaining secretions. Robotic catheter inserted into patient's endotracheal tube.   Target #1 right lower lobe pulmonary nodule: The distinct navigation pathways prepared prior to this procedure were then utilized to navigate to patient's lesion identified on CT scan. The robotic catheter was secured into place and the vision probe was withdrawn.  Lesion location was approximated using fluoroscopy.  Local registration and targeting was performed using Cios three-dimensional imaging. Under fluoroscopic guidance transbronchial needle brushings, transbronchial needle biopsies, and transbronchial forceps biopsies were performed to be sent for cytology and pathology.  Under fluoroscopic guidance a single fiducial marker was placed adjacent to the nodule.  Target #2 right lower lobe superior segmental pulmonary nodule: The distinct navigation pathways prepared prior to this procedure were then utilized to navigate to patient's lesion identified on CT scan. The robotic catheter was secured into place and the vision probe was withdrawn.  Lesion location was approximated using fluoroscopy.  Local registration and targeting was performed using Cios three-dimensional imaging. Under fluoroscopic guidance transbronchial needle brushings, transbronchial needle biopsies, and transbronchial forceps biopsies were performed to be sent for cytology and pathology.  Under fluoroscopic guidance a single fiducial marker was placed adjacent to the nodule.  At the end of the procedure a general airway inspection was performed and there was no evidence of active bleeding. The bronchoscope was removed.  The patient tolerated the procedure well. There was no significant blood loss and there were no obvious complications. A post-procedural chest x-ray is pending.  Samples Target #1: 1. Transbronchial needle  brushings from right lower lobe nodule (more inferior) 2. Transbronchial Wang needle biopsies from right lower lobe nodule 3. Transbronchial forceps biopsies from right lower  lobe nodule  Samples Target #2: 1. Transbronchial needle brushings from right lower lobe superior segmental nodule 2. Transbronchial Wang needle biopsies from right lower lobe superior segmental nodule 3. Transbronchial forceps biopsies from right lower lobe superior segmental nodule   Plans:  The patient will be discharged from the PACU to home when recovered from anesthesia and after chest x-ray is reviewed. We will review the cytology, pathology and microbiology results with the patient when they become available. Outpatient followup will be with [name].    Levy Pupa, MD, PhD 12/16/2022, 2:04 PM  [facility] [phone] or if no answer before 7:00PM call [phone] For any issues after 7:00PM please call eLink [phone]

## 2022-12-16 NOTE — Anesthesia Postprocedure Evaluation (Signed)
Anesthesia Post Note  Patient: Marcus Pace  Procedure(s) Performed: ROBOTIC ASSISTED NAVIGATIONAL BRONCHOSCOPY BRONCHIAL NEEDLE ASPIRATION BIOPSIES BRONCHIAL BRUSHINGS BRONCHIAL BIOPSIES FIDUCIAL MARKER PLACEMENT     Patient location during evaluation: PACU Anesthesia Type: General Level of consciousness: awake and alert Pain management: pain level controlled Vital Signs Assessment: post-procedure vital signs reviewed and stable Respiratory status: spontaneous breathing, nonlabored ventilation and respiratory function stable Cardiovascular status: stable and blood pressure returned to baseline Anesthetic complications: no   No notable events documented.  Last Vitals:  Vitals:   12/16/22 1445 12/16/22 1500  BP: 124/79 126/78  Pulse: 72 67  Resp: (!) 25 19  Temp:  36.5 C  SpO2: 96% 93%    Last Pain:  Vitals:   12/16/22 1500  TempSrc:   PainSc: 0-No pain                 Beryle Lathe

## 2022-12-16 NOTE — Interval H&P Note (Signed)
History and Physical Interval Note:  12/16/2022 11:11 AM  Marcus Pace  has presented today for surgery, with the diagnosis of PULMONARY NODULES.  The various methods of treatment have been discussed with the patient and family. After consideration of risks, benefits and other options for treatment, the patient has consented to  Procedure(s): ROBOTIC ASSISTED NAVIGATIONAL BRONCHOSCOPY (N/A) as a surgical intervention.  The patient's history has been reviewed, patient examined, no change in status, stable for surgery.  I have reviewed the patient's chart and labs.  Questions were answered to the patient's satisfaction.     [name]

## 2022-12-16 NOTE — Transfer of Care (Signed)
Immediate Anesthesia Transfer of Care Note  Patient: Marcus Pace  Procedure(s) Performed: ROBOTIC ASSISTED NAVIGATIONAL BRONCHOSCOPY BRONCHIAL NEEDLE ASPIRATION BIOPSIES BRONCHIAL BRUSHINGS BRONCHIAL BIOPSIES FIDUCIAL MARKER PLACEMENT  Patient Location: PACU  Anesthesia Type:General  Level of Consciousness: awake, alert , and oriented  Airway & Oxygen Therapy: Patient Spontanous Breathing and Patient connected to face mask oxygen  Post-op Assessment: Report given to RN and Post -op Vital signs reviewed and stable  Post vital signs: Reviewed and stable  Last Vitals:  Vitals Value Taken Time  BP 98/59 12/16/22 1415  Temp 36.5 C 12/16/22 1415  Pulse 77 12/16/22 1419  Resp 18 12/16/22 1419  SpO2 94 % 12/16/22 1419  Vitals shown include unfiled device data.  Last Pain:  Vitals:   12/16/22 1415  TempSrc:   PainSc: 0-No pain         Complications: No notable events documented.

## 2022-12-19 ENCOUNTER — Telehealth: Payer: Self-pay | Admitting: Emergency Medicine

## 2022-12-19 DIAGNOSIS — C3490 Malignant neoplasm of unspecified part of unspecified bronchus or lung: Secondary | ICD-10-CM

## 2022-12-19 NOTE — Telephone Encounter (Signed)
Reviewed biopsy results with the patient.  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  I recommended referral to thoracic oncology and he would like to be seen in [place].  I will make the referral now.

## 2022-12-23 ENCOUNTER — Encounter (HOSPITAL_COMMUNITY): Payer: Self-pay | Admitting: Emergency Medicine

## 2022-12-26 ENCOUNTER — Other Ambulatory Visit: Payer: Self-pay

## 2022-12-27 NOTE — Progress Notes (Signed)
The proposed treatment discussed in conference is for discussion purpose only and is not a binding recommendation.  The patients have not been physically examined, or presented with their treatment options.  Therefore, final treatment plans cannot be decided.  

## 2023-01-01 ENCOUNTER — Telehealth: Payer: Self-pay

## 2023-01-01 NOTE — Telephone Encounter (Signed)
Left message for patient to call back regarding appointment tomorrow, 01/02/2023.  Please send message back to Marcus Pace [facility] CMA today) to speak with patient (per [facility]).

## 2023-01-02 ENCOUNTER — Inpatient Hospital Stay: Payer: Medicare HMO | Attending: Internal Medicine | Admitting: Internal Medicine

## 2023-01-02 ENCOUNTER — Inpatient Hospital Stay: Payer: Medicare HMO

## 2023-01-02 ENCOUNTER — Ambulatory Visit: Payer: Medicare HMO | Admitting: Primary Care

## 2023-01-02 ENCOUNTER — Other Ambulatory Visit: Payer: Self-pay | Admitting: Medical Oncology

## 2023-01-02 ENCOUNTER — Telehealth: Payer: Self-pay | Admitting: *Deleted

## 2023-01-02 VITALS — BP 139/89 | HR 84 | Temp 98.2°F | Resp 16 | Ht 71.0 in | Wt 135.0 lb

## 2023-01-02 DIAGNOSIS — J439 Emphysema, unspecified: Secondary | ICD-10-CM | POA: Diagnosis not present

## 2023-01-02 DIAGNOSIS — C3490 Malignant neoplasm of unspecified part of unspecified bronchus or lung: Secondary | ICD-10-CM

## 2023-01-02 DIAGNOSIS — F17210 Nicotine dependence, cigarettes, uncomplicated: Secondary | ICD-10-CM | POA: Diagnosis not present

## 2023-01-02 DIAGNOSIS — Z809 Family history of malignant neoplasm, unspecified: Secondary | ICD-10-CM | POA: Insufficient documentation

## 2023-01-02 DIAGNOSIS — C3431 Malignant neoplasm of lower lobe, right bronchus or lung: Secondary | ICD-10-CM | POA: Insufficient documentation

## 2023-01-02 DIAGNOSIS — Z79899 Other long term (current) drug therapy: Secondary | ICD-10-CM | POA: Insufficient documentation

## 2023-01-02 DIAGNOSIS — Z86711 Personal history of pulmonary embolism: Secondary | ICD-10-CM | POA: Diagnosis not present

## 2023-01-02 LAB — CMP (CANCER CENTER ONLY)
ALT: 32 U/L (ref 0–44)
AST: 31 U/L (ref 15–41)
Albumin: 4.5 g/dL (ref 3.5–5.0)
Alkaline Phosphatase: 73 U/L (ref 38–126)
Anion gap: 5 (ref 5–15)
BUN: 12 mg/dL (ref 8–23)
CO2: 30 mmol/L (ref 22–32)
Calcium: 9.9 mg/dL (ref 8.9–10.3)
Chloride: 106 mmol/L (ref 98–111)
Creatinine: 1.05 mg/dL (ref 0.61–1.24)
GFR, Estimated: >60 mL/min (ref >60)
Glucose, Bld: 90 mg/dL (ref 70–99)
Potassium: 4.9 mmol/L (ref 3.5–5.1)
Sodium: 141 mmol/L (ref 135–145)
Total Bilirubin: 1.3 mg/dL — ABNORMAL HIGH (ref 0.3–1.2)
Total Protein: 7.7 g/dL (ref 6.5–8.1)

## 2023-01-02 LAB — CBC WITH DIFFERENTIAL (CANCER CENTER ONLY)
Abs Immature Granulocytes: 0.08 10*3/uL — ABNORMAL HIGH (ref 0.00–0.07)
Basophils Absolute: 0.1 10*3/uL (ref 0.0–0.1)
Basophils Relative: 1 %
Eosinophils Absolute: 0.2 10*3/uL (ref 0.0–0.5)
Eosinophils Relative: 2 %
HCT: 47.2 % (ref 39.0–52.0)
Hemoglobin: 16.1 g/dL (ref 13.0–17.0)
Immature Granulocytes: 1 %
Lymphocytes Relative: 18 %
Lymphs Abs: 1.3 10*3/uL (ref 0.7–4.0)
MCH: 29.9 pg (ref 26.0–34.0)
MCHC: 34.1 g/dL (ref 30.0–36.0)
MCV: 87.7 fL (ref 80.0–100.0)
Monocytes Absolute: 0.8 10*3/uL (ref 0.1–1.0)
Monocytes Relative: 11 %
Neutro Abs: 5 10*3/uL (ref 1.7–7.7)
Neutrophils Relative %: 67 %
Platelet Count: 243 10*3/uL (ref 150–400)
RBC: 5.38 MIL/uL (ref 4.22–5.81)
RDW: 13.4 % (ref 11.5–15.5)
WBC Count: 7.4 10*3/uL (ref 4.0–10.5)
nRBC: 0 % (ref 0.0–0.2)

## 2023-01-02 NOTE — Telephone Encounter (Signed)
Spoke with patient regarding message that was left yesterday about his appt. Today with Ames Dura, NP. [name] had already discussed his results and patient stated he did not have any further questions at this time so he wanted to cancel his 11am OV with BW. He stated he is still planning to go to his appt. Today that is scheduled with Mr. Gwenyth Bouillon.

## 2023-01-02 NOTE — Progress Notes (Signed)
[facility] Telephone:([phone]) [phone]   [phone]  CONSULT NOTE  REFERRING PHYSICIAN: [name]  REASON FOR CONSULTATION:  67 years old African-American male recently diagnosed with lung cancer.  HPI BENJERMIN Pace is a 67 y.o. male with past medical history significant for COPD, osteoarthritis, pulmonary embolism as well as lumbar radiculopathy and long history of smoking.  The patient has been followed by [name] for his COPD and he had CT scan of the chest on March 01, 2021 that showed new 1.3 x 1.5 cm right upper lobe nodule in addition to 0.8 x 1.1 cm right lower lobe nodule.  He had a PET scan on July 02, 2021 and that showed hypermetabolic pulmonary nodules with somewhat of a waxing and waning history suggesting an infectious/inflammatory etiology but malignancy could not be excluded especially within the dominant right lower lobe nodule.  The patient was followed by observation and repeat CT scan of the chest on 07/22/2022 showed persistent and enlarging nodules in the right lower lobe and bronchogenic neoplasm remains a differential consideration.  There was also a new nodule lungs the pleural margin of the right upper lobe with irregular margin that showed relatively rapid development and morphology was still concerning for malignancy.  The patient had repeat CT super D of the chest on 12/13/2022 and that showed enlarging adjacent right lower lobe pulmonary nodules consistent with a neoplastic process.  There was also a stable 1.2 cm right lower lobe pulmonary nodule and there was resolution of the medial subpleural nodular lesion seen in the right upper lobe on the prior study.  There was also stable scattered mediastinal and hilar lymph nodes.  The patient underwent video bronchoscopy with robotic assisted bronchoscopic navigation under the care of [name] on 12/16/2022 and the final pathology ([id number]) from the right lower lobe target 1  nontarget 2 fine-needle aspiration showed adenocarcinoma.  The patient is scheduled for a PET scan tomorrow. [name] kindly referred the patient to me today for evaluation and recommendation regarding treatment of his condition. When seen today he is feeling fine with no concerning complaints except for the shortness of breath secondary to COPD.  He also has mild cough with no significant chest pain or hemoptysis.  He has no nausea, vomiting, diarrhea or constipation.  He has no headache or visual changes.  He has no recent weight loss or night sweats. Family history significant for sister with cancer and father with heart disease.  Mother also has suspicious history of cancer. The patient is single and has no children.  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  The patient has a history of smoking for around 45 years and he continues to smoke.  He also drinks beer at regular basis and no history of drug abuse.  HPI  Past Medical History:  Diagnosis Date   Arthritis    Bacteremia    2016 or so with hospitalization   Emphysema of lung (HCC) 07/20/2019   Inguinal hernia recurrent unilateral    herniation of mesentary and loops of bowel to right scrotal sac, 4.5 cm defect   Lesion of right lung 07/20/2019   Lumbar radiculopathy 02/15/2021   Pulmonary emboli (HCC) 07/20/2019    Past Surgical History:  Procedure Laterality Date   BRONCHIAL BIOPSY  12/16/2022   Procedure: BRONCHIAL BIOPSIES;  Surgeon: [name], MD;  Location: [facility];  Service: Pulmonary;;   BRONCHIAL BRUSHINGS  12/16/2022   Procedure: BRONCHIAL BRUSHINGS;  Surgeon: [name], MD;  Location: [facility];  Service: Pulmonary;;   BRONCHIAL NEEDLE ASPIRATION BIOPSY  12/16/2022   Procedure: BRONCHIAL NEEDLE ASPIRATION BIOPSIES;  Surgeon: [name], MD;  Location: [facility];  Service: Pulmonary;;   FIDUCIAL MARKER PLACEMENT  12/16/2022   Procedure: FIDUCIAL MARKER PLACEMENT;  Surgeon:  [name], MD;  Location: [facility];  Service: Pulmonary;;   NO PAST SURGERIES      Family History  Problem Relation Age of Onset   Cancer Mother    Heart disease Father     Social History Social History   Tobacco Use   Smoking status: Some Days    Current packs/day: 1.00    Types: Cigarettes   Smokeless tobacco: Never   Tobacco comments:    4 cigarettes smoked daily PAP 02/20/2022  Vaping Use   Vaping status: Never Used  Substance Use Topics   Alcohol use: Yes   Drug use: Yes    Frequency: 3.0 times per week    Types: Marijuana    No Known Allergies  Current Outpatient Medications  Medication Sig Dispense Refill   acetaminophen (TYLENOL) 500 MG tablet Take 1,000 mg by mouth every 8 (eight) hours as needed for moderate pain.     albuterol (VENTOLIN HFA) 108 (90 Base) MCG/ACT inhaler Inhale 2 puffs into the lungs every 4 (four) hours as needed for wheezing or shortness of breath. 8.5 g 6   buPROPion (WELLBUTRIN XL) 150 MG 24 hr tablet Take 1 tablet (150 mg total) by mouth daily. For smoking cessation 30 tablet 6   Fluticasone-Umeclidin-Vilant (TRELEGY ELLIPTA) 100-62.5-25 MCG/ACT AEPB Inhale 1 puff into the lungs daily. 60 each 6   melatonin 3 MG TABS tablet Take 1 tablet (3 mg total) by mouth at bedtime. 30 tablet 3   Multiple Vitamin (MULTIVITAMIN WITH MINERALS) TABS tablet Take 1 tablet by mouth daily. 30 tablet 0   tamsulosin (FLOMAX) 0.4 MG CAPS capsule Take 1 capsule (0.4 mg total) by mouth daily. 30 capsule 6   thiamine 100 MG tablet Take 1 tablet (100 mg total) by mouth daily. 30 tablet 0   traZODone (DESYREL) 50 MG tablet Take 1 tablet (50 mg total) by mouth at bedtime as needed for sleep. 30 tablet 6   No current facility-administered medications for this visit.    Review of Systems  Constitutional: positive for fatigue Eyes: negative Ears, nose, mouth, throat, and face: negative Respiratory: positive for cough and dyspnea on  exertion Cardiovascular: negative Gastrointestinal: negative Genitourinary:negative Integument/breast: negative Hematologic/lymphatic: negative Musculoskeletal:negative Neurological: negative Behavioral/Psych: negative Endocrine: negative Allergic/Immunologic: negative  Physical Exam  WUJ:WJXBJ, healthy, no distress, well nourished, well developed, and anxious SKIN: skin color, texture, turgor are normal, no rashes or significant lesions HEAD: Normocephalic, No masses, lesions, tenderness or abnormalities EYES: normal, PERRLA, Conjunctiva are pink and non-injected EARS: External ears normal, Canals clear OROPHARYNX:no exudate, no erythema, and lips, buccal mucosa, and tongue normal  NECK: supple, no adenopathy, no JVD LYMPH:  no palpable lymphadenopathy, no hepatosplenomegaly LUNGS: clear to auscultation , and palpation HEART: regular rate & rhythm, no murmurs, and no gallops ABDOMEN:abdomen soft, non-tender, normal bowel sounds, and no masses or organomegaly BACK: Back symmetric, no curvature., No CVA tenderness EXTREMITIES:no joint deformities, effusion, or inflammation, no edema  NEURO: alert & oriented x 3 with fluent speech, no focal motor/sensory deficits  PERFORMANCE STATUS: ECOG 1  LABORATORY DATA: Lab Results  Component Value Date  WBC 7.4 01/02/2023   HGB 16.1 01/02/2023   HCT 47.2 01/02/2023   MCV 87.7 01/02/2023   PLT 243 01/02/2023      Chemistry      Component Value Date/Time   NA 142 12/05/2021 1610   K 4.6 12/05/2021 1610   CL 102 12/05/2021 1610   CO2 26 12/05/2021 1610   BUN 10 12/05/2021 1610   CREATININE 0.94 12/05/2021 1610      Component Value Date/Time   CALCIUM 9.7 12/05/2021 1610   ALKPHOS 89 11/15/2020 0831   AST 30 11/15/2020 0831   ALT 25 11/15/2020 0831   BILITOT 1.0 11/15/2020 0831   BILITOT 1.0 08/25/2019 1200       RADIOGRAPHIC STUDIES: CT Super D Chest Wo Contrast  Result Date: 12/19/2022 CLINICAL DATA:  Followup  pulmonary nodules. EXAM: CT CHEST WITHOUT CONTRAST TECHNIQUE: Multidetector CT imaging of the chest was performed using thin slice collimation for electromagnetic bronchoscopy planning purposes, without intravenous contrast. RADIATION DOSE REDUCTION: This exam was performed according to the departmental dose-optimization program which includes automated exposure control, adjustment of the mA and/or kV according to patient size and/or use of iterative reconstruction technique. COMPARISON:  07/22/2022 FINDINGS: Cardiovascular: The heart is normal in size. No pericardial effusion. The aorta is normal in caliber. Stable scattered atherosclerotic calcifications. Stable scattered coronary artery calcifications. Mediastinum/Nodes: Scattered mediastinal and hilar lymph nodes are stable. These measure less than 8 mm. The esophagus is grossly normal. Lungs/Pleura: Enlarging adjacent right lower lobe pulmonary nodules. The more superior and lateral nodule measures 13 mm on image 113/4 in the larger more inferior and medial lesion measures a maximum of 16 mm. These previously measured 9 and 13 mm. Findings consistent with a neoplastic process. The medial subpleural nodular lesions seen in the right upper lobe on the prior study has resolved. 12 mm nodule in the right lower lobe on image number 91/4 is stable in size when compared to the prior study. Stable nodular band of scar tissue in the left upper lobe. Stable advanced emphysematous changes and pulmonary scarring. Upper Abdomen: No significant upper abdominal findings. Musculoskeletal: No significant bony findings. IMPRESSION: 1. Enlarging adjacent right lower lobe pulmonary nodules consistent with a neoplastic process. 2. Stable 12 mm right lower lobe pulmonary nodule. 3. Resolution of the medial subpleural nodular lesions seen in the right upper lobe on the prior study. 4. Stable scattered mediastinal and hilar lymph nodes. Stable underlying emphysematous changes and  vascular disease. Emphysema (ICD10-J43.9). Electronically Signed   By: Rudie Meyer M.D.   On: 12/19/2022 09:02   DG Chest Port 1 View  Result Date: 12/16/2022 CLINICAL DATA:  Status post bronchoscopy EXAM: PORTABLE CHEST 1 VIEW COMPARISON:  CT chest, 12/13/2022 FINDINGS: Biopsy marking clip within nodules of the inferior right lower lobe and perihilar right lower lobe. Emphysema with a large bulla of the posterior right lung and bandlike scarring of the peripheral left midlung. Heart and mediastinum unremarkable. Osseous structures unremarkable. IMPRESSION: 1. Biopsy marking clip within nodules of the inferior right lower lobe and perihilar right lower lobe. 2. Emphysema with a large bulla of the posterior right lung and bandlike scarring of the peripheral left midlung. Electronically Signed   By: Jearld Lesch M.D.   On: 12/16/2022 14:58   DG C-ARM BRONCHOSCOPY  Result Date: 12/16/2022 C-ARM BRONCHOSCOPY: Fluoroscopy was utilized by the requesting physician.  No radiographic interpretation.    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   PLAN: I had a lengthy discussion with the patient and his sister today about his current disease stage, prognosis and treatment options. I strongly recommend for the patient to keep his appointment for the PET scan tomorrow to complete the staging workup.  I may consider him for MRI of the brain to rule out brain metastasis. Based on his COPD and pulmonary abnormality seen on the CT scan I doubt the patient will be a good surgical candidate for resection. If the PET scan showed no concerning findings and the hilar or mediastinal lymph nodes, he may benefit from curative SBRT to the 2 pulmonary  nodules in the right lung but if the hilar/mediastinal lymph nodes showed hypermetabolic activity, he may benefit from a concurrent course of chemoradiation. I will refer the patient to radiation oncology for evaluation and discussion of the radiotherapy option. I will see the patient back for follow-up visit in around 2 weeks for evaluation and more detailed discussion of his treatment options based on the PET scan results. I strongly encouraged the patient to quit smoking. He was advised to call immediately if he has any other concerning symptoms in the interval. The patient voices understanding of current disease status and treatment options and is in agreement with the current care plan.  All questions were answered. The patient knows to call the clinic with any problems, questions or concerns. We can certainly see the patient much sooner if necessary.  Thank you so much for allowing me to participate in the care of Marcus Pace. I will continue to follow up the patient with you and assist in his care.  The total time spent in the appointment was 60 minutes.  Disclaimer: This note was dictated with voice recognition software. Similar sounding words can inadvertently be transcribed and may not be corrected upon review.   Lajuana Matte January 02, 2023, 1:55 PM

## 2023-01-03 ENCOUNTER — Telehealth: Payer: Self-pay | Admitting: Radiation Oncology

## 2023-01-03 ENCOUNTER — Encounter (HOSPITAL_COMMUNITY)
Admission: RE | Admit: 2023-01-03 | Discharge: 2023-01-03 | Disposition: A | Discharge status: Home / Self Care | Payer: Medicare HMO | Source: Ambulatory Visit | Attending: Emergency Medicine | Admitting: Emergency Medicine

## 2023-01-03 DIAGNOSIS — C3490 Malignant neoplasm of unspecified part of unspecified bronchus or lung: Secondary | ICD-10-CM | POA: Diagnosis present

## 2023-01-03 LAB — GLUCOSE, CAPILLARY: Glucose-Capillary: 101 mg/dL — ABNORMAL HIGH (ref 70–99)

## 2023-01-03 MED ORDER — FLUDEOXYGLUCOSE F - 18 (FDG) INJECTION
7.1000 | Freq: Once | INTRAVENOUS | Status: AC
  Administered 2023-01-03: 6.75 via INTRAVENOUS

## 2023-01-03 NOTE — Telephone Encounter (Signed)
8/30 @ 10:49 am Left voicemail for patient to call our office to be schedule for consult.

## 2023-01-07 ENCOUNTER — Telehealth: Payer: Self-pay | Admitting: Internal Medicine

## 2023-01-07 NOTE — Telephone Encounter (Signed)
Scheduled per 08/29 los, patient has been called and voicemail was left. 

## 2023-01-08 NOTE — Progress Notes (Signed)
Thoracic Location of Tumor / Histology:   Pt had pet scan on 01-03-23  CT Super D Chest Wo Contrast 12/13/2022  IMPRESSION: 1. Enlarging adjacent right lower lobe pulmonary nodules consistent with a neoplastic process. 2. Stable 12 mm right lower lobe pulmonary nodule. 3. Resolution of the medial subpleural nodular lesions seen in the right upper lobe on the prior study. 4. Stable scattered mediastinal and hilar lymph nodes. Stable underlying emphysematous changes and vascular disease.  Patient presented with symptoms of:  ROV 12/04/22 --follow-up visit for 67 year old man history of active tobacco use (50 pack years), abnormal CT chest following hospitalization for pulmonary nodule, small segmental pulmonary emboli and a pneumonia with lung abscess.  We have been following serial imaging.  The right lower lobe nodule has been stable in size but did show hypermetabolism on PET scan.  Currently treated with Trelegy. Uses albuterol about every other day. He has been having more SOB in the hot weather. Some cough, especially in am - comes and goes. Mucous production in the am. No flares.   Biopsies revealed:  12-16-22 FINAL MICROSCOPIC DIAGNOSIS:  A. LUNG, RLL, FINE NEEDLE ASPIRATION  BIOPSY:  - Adenocarcinoma   B. LUNG, RLL, BRUSHING:  - Adenocarcinoma   C. LUNG, RLL TARGET 2, FINE NEEDLE ASPIRATION  BIOPSY:  - Malignant cells present   D. LUNG, RLL TARGET 2, BRUSHING:  - Malignant cells present    Tobacco/Marijuana/Snuff/ETOH use: 67 year old gentleman with a history of tobacco use (50 pack years) , The patient has a history of smoking for around 45 years and he continues to smoke.  He also drinks beer at regular basis and no history of drug abuse.  Past /Anticipated interventions by cardiothoracic surgery, if any: Video Bronchoscopy with Robotic Assisted Bronchoscopic Navigation    Date of Operation: 12/16/2022    Pre-op Diagnosis: Right lower lobe pulmonary nodules   Post-op  Diagnosis: Same   Surgeon: Levy Pupa  :  Past/Anticipated interventions by medical oncology, if any:  [name], MD 01/02/2023  HPI Marcus Pace is a 67 y.o. male with past medical history significant for COPD, osteoarthritis, pulmonary embolism as well as lumbar radiculopathy and long history of smoking.  The patient has been followed by [name] for his COPD and he had CT scan of the chest on March 01, 2021 that showed new 1.3 x 1.5 cm right upper lobe nodule in addition to 0.8 x 1.1 cm right lower lobe nodule.  He had a PET scan on July 02, 2021 and that showed hypermetabolic pulmonary nodules with somewhat of a waxing and waning history suggesting an infectious/inflammatory etiology but malignancy could not be excluded especially within the dominant right lower lobe nodule.  The patient was followed by observation and repeat CT scan of the chest on 07/22/2022 showed persistent and enlarging nodules in the right lower lobe and bronchogenic neoplasm remains a differential consideration.  There was also a new nodule lungs the pleural margin of the right upper lobe with irregular margin that showed relatively rapid development and morphology was still concerning for malignancy.  The patient had repeat CT super D of the chest on 12/13/2022 and that showed enlarging adjacent right lower lobe pulmonary nodules consistent with a neoplastic process.  There was also a stable 1.2 cm right lower lobe pulmonary nodule and there was resolution of the medial subpleural nodular lesion seen in the right upper lobe on the prior study.  There was also stable scattered mediastinal and hilar lymph  nodes.  The patient underwent video bronchoscopy with robotic assisted bronchoscopic navigation under the care of [name] on 12/16/2022 and the final pathology ([id number]) from the right lower lobe target 1 nontarget 2 fine-needle aspiration showed adenocarcinoma.  The patient is scheduled for a PET scan  tomorrow. [name] kindly referred the patient to me today for evaluation and recommendation regarding treatment of his condition. When seen today he is feeling fine with no concerning complaints except for the shortness of breath secondary to COPD.  He also has mild cough with no significant chest pain or hemoptysis.  He has no nausea, vomiting, diarrhea or constipation.  He has no headache or visual changes.  He has no recent weight loss or night sweats. Family history significant for sister with cancer and father with heart disease.  Mother also has suspicious history of cancer. The patient is single and has no children.  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  The patient has a history of smoking for around 45 years and he continues to smoke.  He also drinks beer at regular basis and no history of drug abuse.  ASSESSMENT: This is a very pleasant 67 years old African-American male with likely stage IIB/IIIA (T3, N0/N2, M0) non-small cell lung cancer, adenocarcinoma presented with 2 pulmonary nodule in the right lower lobe in addition to suspicious mediastinal lymphadenopathy pending staging workup diagnosed in August 2024.    PLAN: I had a lengthy discussion with the patient and his sister today about his current disease stage, prognosis and treatment options. I strongly recommend for the patient to keep his appointment for the PET scan tomorrow to complete the staging workup.  I may consider him for MRI of the brain to rule out brain metastasis. Based on his COPD and pulmonary abnormality seen on the CT scan I doubt the patient will be a good surgical candidate for resection. If the PET scan showed no concerning findings and the hilar or mediastinal lymph nodes, he may benefit from curative SBRT to the 2 pulmonary nodules in the right lung but if the hilar/mediastinal lymph nodes showed hypermetabolic activity, he may benefit from a concurrent course of chemoradiation. I will  refer the patient to radiation oncology for evaluation and discussion of the radiotherapy option. I will see the patient back for follow-up visit in around 2 weeks for evaluation and more detailed discussion of his treatment options based on the PET scan results. I strongly encouraged the patient to quit smoking. He was advised to call immediately if he has any other concerning symptoms in the interval.  Signs/Symptoms Weight changes, if any: no Wt Readings from Last 3 Encounters:  01/13/23 136 lb 4 oz (61.8 kg)  01/02/23 135 lb (61.2 kg)  12/16/22 135 lb (61.2 kg)    Respiratory complaints, if any: yes, shortness of breath and productive cough. Hemoptysis, if any: no Pain issues, if any:  no  SAFETY ISSUES: Prior radiation? no Pacemaker/ICD? no  Possible current pregnancy?no Is the patient on methotrexate? no  Current Complaints / other details:  Patient states he has not take any of his prescribed medications due to not being able to afford them.    BP 137/77 (BP Location: Left Arm, Patient Position: Sitting)   Pulse 68   Temp (!) 97.5 F (36.4 C) (Temporal)   Resp 18   Ht 5\' 11"  (1.803 m)   Wt 136 lb 4 oz (61.8 kg)   SpO2 95%  BMI 19.00 kg/m

## 2023-01-11 ENCOUNTER — Ambulatory Visit (HOSPITAL_COMMUNITY)
Admission: RE | Admit: 2023-01-11 | Discharge: 2023-01-11 | Disposition: A | Discharge status: Home / Self Care | Payer: Medicare HMO | Source: Ambulatory Visit | Attending: Internal Medicine | Admitting: Internal Medicine

## 2023-01-11 DIAGNOSIS — C3490 Malignant neoplasm of unspecified part of unspecified bronchus or lung: Secondary | ICD-10-CM | POA: Diagnosis present

## 2023-01-11 MED ORDER — GADOBUTROL 1 MMOL/ML IV SOLN
6.0000 mL | Freq: Once | INTRAVENOUS | Status: AC | PRN
  Administered 2023-01-11: 6 mL via INTRAVENOUS

## 2023-01-12 NOTE — Progress Notes (Signed)
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 male who is seen as a courtesy of Dr. Arbutus Ped for an opinion concerning radiation therapy as part of management for his recently diagnosed right lung cancer. The patient has a medical history notable for COPD. He is also a current smoker and has a significant smoking history.   The patient initially presented to the ED on 11/15/2020 with progressive SOB. CTA of the chest performed at that time showed no evidence of PE and incidentally revealed a dominant right lower lobe irregular opacity/nodule measuring approximately 10 mm. He continued with surveillance imaging consisting of a chest CT on 03/01/21 which demonstrated new nodules in the RUL and redemonstrated the dominant nodule in the RLL measuring 8 x 11 mm.   He was subsequently referred to [name] in February 2023 and presented for a PET scan on 07/02/21 which demonstrated several hypermetabolic right sided pulmonary nodules including the RLL nodule measuring 13 mm. The other nodules were noted to fluctuate in size since imaging in October 2022. (Based on the waxing and waning history of the bilateral pulmonary nodules, an infectious or inflammatory was favored).   PET otherwise showed no evidence of mediastinal or hilar adenopathy. A super-D chest CT was also performed on 07/02/22 which demonstrated: a significant interval decrease in size of the 2 nodules in the RUL, a new spiculated nodule of the anterior RUL measuring 1.0 x 0.6 cm, multiple additional nonspecific and unchanged irregular nodules in the right lung.   He proceeded with follow-up imaging and later presented for repeat chest CT without contrast on 01/17/22 which showed: an interval decrease in size of the previously identified new RUL nodule (favoring a benign etiology); an interval increase in the solid component of the part solid RLL nodule; and no change in the primarily solid RLL nodule measuring up to 11 mm.   Follow-up chest CT on 08/01/22 showed further interval enlargement of the RLL pulmonary nodules and a new nodule along the pleural margin of the RUL.   Repeat super-D chest CT on 12/13/22 again showed interval enlargement of the adjacent RLL pulmonary nodules raising concern for a neoplastic process. Imaging otherwise showed stability of the 12 mm RLL nodule, resolution of the medial subpleural nodular lesions in the RUL, and stable appearing scattered mediastinal and hilar lymph nodes.   Given a further interval increase in size of the RLL nodules, [name] recommended proceeding with tissue sampling.   He opted to proceed with bronchoscopy and biopsies on 12/16/22 under the care of [name]. FNA and brushings of the RLL collected showed findings consisting with adenocarcinoma.   Subsequently, the patient was referred to Dr. Arbutus Ped on 01/02/23. During which time, the patient denied any concerns other than SOB secondary to  COPD, and a mild cough without significant chest pain or hemoptysis.   In terms of treatment, Dr. Arbutus Ped recommends curative SBRT to the 2 pulmonary nodules in the right lung. However, if his pending PET scan shows evidence of hypermetabolic mediastinal or hilar  adenopathy, he may benefit from a course of concurrent chemoradiation. Based on his history of COPD, smoking, and respiratory status, he will likely not be a good surgical candidate.   PET scan showed progressive 2 cm right lower lobe nodule highly suspicious for invasive adenocarcinoma.  A 1.2 cm nodule in the superior segment of the right lower lobe is also suspicious for indolent primary bronchogenic carcinoma given hypermetabolism.    PREVIOUS RADIATION THERAPY: No  PAST MEDICAL HISTORY:  Past Medical History:  Diagnosis Date   Arthritis    Bacteremia    2016 or so with hospitalization   Emphysema of lung (HCC) 07/20/2019   Inguinal hernia recurrent unilateral    herniation of mesentary and loops of bowel to right scrotal sac, 4.5 cm defect   Lesion of right lung 07/20/2019   Lumbar radiculopathy 02/15/2021   Pulmonary emboli (HCC) 07/20/2019    PAST SURGICAL HISTORY: Past Surgical History:  Procedure Laterality Date   BRONCHIAL BIOPSY  12/16/2022   Procedure: BRONCHIAL BIOPSIES;  Surgeon: [name], MD;  Location: [facility] ENDOSCOPY;  Service: Pulmonary;;   BRONCHIAL BRUSHINGS  12/16/2022   Procedure: BRONCHIAL BRUSHINGS;  Surgeon: [name], MD;  Location: [facility] ENDOSCOPY;  Service: Pulmonary;;   BRONCHIAL NEEDLE ASPIRATION BIOPSY  12/16/2022   Procedure: BRONCHIAL NEEDLE ASPIRATION BIOPSIES;  Surgeon: [name], MD;  Location: [facility] ENDOSCOPY;  Service: Pulmonary;;   FIDUCIAL MARKER PLACEMENT  12/16/2022   Procedure: FIDUCIAL MARKER PLACEMENT;  Surgeon: [name], MD;  Location: [facility] ENDOSCOPY;  Service: Pulmonary;;   NO PAST SURGERIES      FAMILY HISTORY:  Family History  Problem Relation Age of Onset   Cancer Mother    Heart disease Father     SOCIAL HISTORY:  Social History   Tobacco Use   Smoking status: Some Days    Current packs/day: 1.00    Types: Cigarettes   Smokeless tobacco: Never   Tobacco comments:    4 cigarettes smoked daily PAP 02/20/2022   Vaping Use   Vaping status: Never Used  Substance Use Topics   Alcohol use: Yes   Drug use: Yes    Frequency: 3.0 times per week    Types: Marijuana    ALLERGIES: No Known Allergies  MEDICATIONS:  Current Outpatient Medications  Medication Sig Dispense Refill   acetaminophen (TYLENOL) 500 MG tablet Take 1,000 mg by mouth every 8 (eight) hours as needed for moderate pain. (Patient not taking: Reported on 01/13/2023)     albuterol (VENTOLIN HFA) 108 (90 Base) MCG/ACT inhaler Inhale 2 puffs into the lungs every 4 (four) hours as needed for wheezing or shortness of breath. 8.5 g 6   buPROPion (WELLBUTRIN XL) 150 MG 24 hr tablet Take 1 tablet (150 mg total) by mouth daily. For smoking cessation (Patient not taking: Reported on 01/13/2023) 30 tablet 6   Fluticasone-Umeclidin-Vilant (TRELEGY ELLIPTA) 100-62.5-25 MCG/ACT AEPB Inhale 1 puff into the lungs daily. (Patient not taking: Reported on 01/13/2023) 60 each 6   melatonin 3 MG TABS tablet Take 1 tablet (3 mg total) by mouth at bedtime. (Patient not taking: Reported on 01/13/2023) 30 tablet 3   Multiple Vitamin (MULTIVITAMIN WITH MINERALS) TABS tablet Take 1 tablet by mouth  daily. (Patient not taking: Reported on 01/13/2023) 30 tablet 0   tamsulosin (FLOMAX) 0.4 MG CAPS capsule Take 1 capsule (0.4 mg total) by mouth daily. (Patient not taking: Reported on 01/13/2023) 30 capsule 6   thiamine 100 MG tablet Take 1 tablet (100 mg total) by mouth daily. (Patient not taking: Reported on 01/13/2023) 30 tablet 0   traZODone (DESYREL) 50 MG tablet Take 1 tablet (50 mg total) by mouth at bedtime as needed for sleep. (Patient not taking: Reported on 01/13/2023) 30 tablet 6   No current facility-administered medications for this encounter.    REVIEW OF SYSTEMS:  A 10+ POINT REVIEW OF SYSTEMS WAS OBTAINED including neurology, dermatology, psychiatry, cardiac, respiratory, lymph, extremities, GI, GU, musculoskeletal, constitutional, reproductive, HEENT.  He denies any  pain within the chest area significant cough or hemoptysis.   PHYSICAL EXAM:  height is 5\' 11"  (1.803 m) and weight is 136 lb 4 oz (61.8 kg). His temporal temperature is 97.5 F (36.4 C) (abnormal). His blood pressure is 137/77 and his pulse is 68. His respiration is 18 and oxygen saturation is 95%.   General: Alert and oriented, in no acute distress HEENT: Head is normocephalic. Extraocular movements are intact. . Neck: Neck is supple, no palpable cervical or supraclavicular lymphadenopathy. Heart: Regular in rate and rhythm with no murmurs, rubs, or gallops. Chest: Clear to auscultation bilaterally, with no rhonchi, wheezes, or rales. Abdomen: Soft, nontender, nondistended, with no rigidity or guarding. Extremities: No cyanosis or edema. Lymphatics: see Neck Exam Skin: No concerning lesions. Musculoskeletal: symmetric strength and muscle tone throughout. Neurologic: Cranial nerves II through XII are grossly intact. No obvious focalities. Speech is fluent. Coordination is intact. Psychiatric: Judgment and insight are intact. Affect is appropriate.   ECOG = 1  0 - Asymptomatic (Fully active, able to carry on all predisease activities without restriction)  1 - Symptomatic but completely ambulatory (Restricted in physically strenuous activity but ambulatory and able to carry out work of a light or sedentary nature. For example, light housework, office work)  2 - Symptomatic, <50% in bed during the day (Ambulatory and capable of all self care but unable to carry out any work activities. Up and about more than 50% of waking hours)  3 - Symptomatic, >50% in bed, but not bedbound (Capable of only limited self-care, confined to bed or chair 50% or more of waking hours)  4 - Bedbound (Completely disabled. Cannot carry on any self-care. Totally confined to bed or chair)  5 - Death   Santiago Glad MM, Creech RH, Tormey DC, et al. [id number]). "Toxicity and response criteria of the [facility]". Am. Evlyn Clines. Oncol. 5 (6): 649-55  LABORATORY DATA:  Lab Results  Component Value Date   WBC 7.4 01/02/2023   HGB 16.1 01/02/2023   HCT 47.2 01/02/2023   MCV 87.7 01/02/2023   PLT 243 01/02/2023   NEUTROABS 5.0 01/02/2023   Lab Results  Component Value Date   NA 141 01/02/2023   K 4.9 01/02/2023   CL 106 01/02/2023   CO2 30 01/02/2023   GLUCOSE 90 01/02/2023   BUN 12 01/02/2023   CREATININE 1.05 01/02/2023   CALCIUM 9.9 01/02/2023      RADIOGRAPHY: NM PET Image Initial (PI) Skull Base To Thigh  Result Date: 01/13/2023 CLINICAL DATA:  Initial treatment strategy for non-small cell lung cancer. EXAM: NUCLEAR MEDICINE PET SKULL BASE TO THIGH TECHNIQUE: 6.8 mCi F-18 FDG was injected intravenously. Full-ring PET imaging was performed from  the skull base to thigh after the radiotracer. CT data was obtained and used for attenuation correction and anatomic localization. Fasting blood glucose: 101 mg/dl COMPARISON:  CT chest dated 12/13/2022.  PET-CT dated 07/02/2021. FINDINGS: Mediastinal blood pool activity: SUV max 2.1 Liver activity: SUV max NA NECK: No hypermetabolic cervical lymphadenopathy. Incidental CT findings: None. CHEST: No hypermetabolic thoracic lymphadenopathy. 1.4 x 2.0 cm predominantly solid nodule in the right lower lobe (series 7/image 58), previously mixed density on remote priors, highly suspicious for invasive adenocarcinoma. Max SUV 2.6. Associated biopsy clip/marker. 12 x 8 mm nodule in the superior segment right lower lobe (series 7/image 42), poorly evaluated due to motion degradation, grossly unchanged from remote priors. However, this demonstrates max SUV 6.0, and therefore is also considered suspicious for indolent primary bronchogenic carcinoma. Associated biopsy clip/marker. Two adjacent 5 mm right upper lobe nodules (series 7/image 30), max SUV 1.9, indeterminate. Additional right upper lobe nodules/nodular opacities with overall waxing/waning  appearance. As such, this favors chronic infection/inflammation. Incidental CT findings: Mild centrilobular and paraseptal emphysematous changes, upper lung predominant, with bullous changes in the right lower lobe. Mild coronary atherosclerosis of the LAD and left circumflex. ABDOMEN/PELVIS: No abnormal hypermetabolism in the liver, spleen, pancreas, or adrenal glands. No hypermetabolic abdominopelvic lymphadenopathy. Incidental CT findings: Prostatomegaly, suggesting BPH. Large right inguinal/scrotal hernia containing multiple loops of nondilated bowel. Atherosclerotic calcifications of the abdominal aorta and branch vessels. SKELETON: No focal hypermetabolic activity to suggest skeletal metastasis. Incidental CT findings: Degenerative changes of the visualized thoracolumbar spine and right hip. IMPRESSION: Progressive 2.0 cm right lower lobe nodule, highly suspicious for invasive adenocarcinoma. Stable 12 mm nodule in the superior segment right lower lobe, suspicious for indolent primary bronchogenic carcinoma given hypermetabolism. Two adjacent 5 mm right upper lobe nodules, indeterminate and favored to be waxing/waning, suggesting chronic infection/inflammation. Continued attention on follow-up is suggested. No findings suspicious for metastatic disease. Electronically Signed   By: Charline Bills M.D.   On: 01/13/2023 09:10   MR BRAIN W WO CONTRAST  Result Date: 01/13/2023 CLINICAL DATA:  67 year old male with lung nodules, non small cell lung cancer. Staging. EXAM: MRI HEAD WITHOUT AND WITH CONTRAST TECHNIQUE: Multiplanar, multiecho pulse sequences of the brain and surrounding structures were obtained without and with intravenous contrast. CONTRAST:  6mL GADAVIST GADOBUTROL 1 MMOL/ML IV SOLN COMPARISON:  None Available. FINDINGS: Brain: Cerebral volume is within normal limits for age. No restricted diffusion to suggest acute infarction. No midline shift, mass effect, evidence of mass lesion,  ventriculomegaly, extra-axial collection or acute intracranial hemorrhage. Cervicomedullary junction and pituitary are within normal limits. No abnormal enhancement identified.  No dural thickening. Mostly normal for age gray and white matter signal throughout the brain. There is mild mostly periventricular, occasionally subcortical, nonspecific scattered white matter T2/FLAIR hyperintensity. No cortical encephalomalacia. No chronic cerebral blood products on SWI. Deep gray nuclei and cerebellum appear negative. There is mild T2 heterogeneity in pons. Vascular: Major intracranial vascular flow voids are preserved. Following contrast the major dural venous sinuses are enhancing and appear to be patent. Skull and upper cervical spine: Partially visible cervical spine degeneration with evidence of degenerative spinal stenosis at C3-C4 on series 7, image 11. Visualized bone marrow signal is within normal limits. Sinuses/Orbits: Negative orbits. Paranasal sinuses and mastoids are well aerated. Other: Visible internal auditory structures appear normal. Negative visible scalp and face. IMPRESSION: 1. No metastatic disease or acute intracranial abnormality. 2. Mild for age signal changes compatible with chronic small vessel disease. 3. Partially visible  cervical spine degeneration with evidence of degenerative spinal stenosis at C3-C4. Electronically Signed   By: Odessa Fleming M.D.   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  Today, I talked to the patient  about the findings and work-up thus far.  We discussed the natural history of non-small cell lung cancer and general treatment, highlighting the role of radiotherapy in the management.  We discussed the available radiation techniques, and focused on the details of logistics and delivery.  We reviewed the anticipated acute and late  sequelae associated with radiation in this setting.  The patient was encouraged to ask questions that I answered to the best of my ability.  A patient consent form was discussed and signed.  We retained a copy for our records.  The patient would like to proceed with radiation and will be scheduled for CT simulation.  PLAN: He will proceed with SBRT simulation on September 16.  Treatments to begin approximately a week later.  Anticipate 3 SBRT sessions directed at each nodule.   60 minutes of total time was spent for this patient encounter, including preparation, face-to-face counseling with the patient and coordination of care, physical exam, and documentation of the encounter.   ------------------------------------------------  Billie Lade, PhD, MD  This document serves as a record of services personally performed by Antony Blackbird, MD. It was created on his behalf by Neena Rhymes, a trained medical scribe. The creation of this record is based on the scribe's personal observations and the provider's statements to them. This document has been checked and approved by the attending provider.

## 2023-01-13 ENCOUNTER — Other Ambulatory Visit: Payer: Self-pay | Admitting: Family Medicine

## 2023-01-13 ENCOUNTER — Telehealth: Payer: Self-pay | Admitting: Emergency Medicine

## 2023-01-13 ENCOUNTER — Other Ambulatory Visit: Payer: Self-pay

## 2023-01-13 ENCOUNTER — Ambulatory Visit
Admission: RE | Admit: 2023-01-13 | Discharge: 2023-01-13 | Disposition: A | Discharge status: Home / Self Care | Payer: Medicare HMO | Source: Ambulatory Visit | Attending: Radiation Oncology | Admitting: Radiation Oncology

## 2023-01-13 ENCOUNTER — Encounter: Payer: Self-pay | Admitting: Radiation Oncology

## 2023-01-13 VITALS — BP 137/77 | HR 68 | Temp 97.5°F | Resp 18 | Ht 71.0 in | Wt 136.2 lb

## 2023-01-13 DIAGNOSIS — C3431 Malignant neoplasm of lower lobe, right bronchus or lung: Secondary | ICD-10-CM

## 2023-01-13 DIAGNOSIS — J439 Emphysema, unspecified: Secondary | ICD-10-CM

## 2023-01-13 DIAGNOSIS — R399 Unspecified symptoms and signs involving the genitourinary system: Secondary | ICD-10-CM

## 2023-01-13 DIAGNOSIS — Z720 Tobacco use: Secondary | ICD-10-CM

## 2023-01-13 DIAGNOSIS — G4709 Other insomnia: Secondary | ICD-10-CM

## 2023-01-13 MED ORDER — ALBUTEROL SULFATE HFA 108 (90 BASE) MCG/ACT IN AERS
2.0000 | INHALATION_SPRAY | RESPIRATORY_TRACT | 6 refills | Status: DC | PRN
  Filled 2023-01-13: qty 8.5, 17d supply, fill #0
  Filled 2023-05-15: qty 8.5, 17d supply, fill #1
  Filled 2023-11-24: qty 6.7, 17d supply, fill #2

## 2023-01-13 NOTE — Telephone Encounter (Signed)
Patient is here in person requesting a refill on his albuterol inhaler.   Pharmacy [facility]

## 2023-01-13 NOTE — Telephone Encounter (Signed)
Refill sent.

## 2023-01-13 NOTE — Progress Notes (Unsigned)
[facility] OFFICE PROGRESS NOTE  Marcus Register, MD [address] [place] [address]  DIAGNOSIS: Stage IIB (T3, N0, M0) non-small cell lung cancer, adenocarcinoma. He presented with 2 pulmonary nodules in the right lower lobe. ***NO Lnls***in addition to suspicious mediastinal lymphadenopathy    ***send moleculars  PRIOR THERAPY: None  CURRENT THERAPY: SBRT to the 2 pulmonary nodules in the right lung under the care of Dr. Roselind Pace.  He is scheduled for simulation on 01/20/2023.  INTERVAL HISTORY: Marcus Pace 67 y.o. male returns to clinic today for follow-up visit.  Establish care with Dr. Arbutus Pace on 01/02/2023.  The patient needs to complete the staging workup at that time with a brain MRI and a PET scan.  He also saw Dr. Roselind Pace who is planning on ***he is scheduled for simulation on 01/20/23.   Since last being seen, denies any major changes in his health.  Denies any fever, chills, night sweats, or unexplained weight loss.  He reports baseline dyspnea exertion due to his COPD.  He reports mild cough without any chest pain or hemoptysis.  Denies any nausea, vomiting, diarrhea, or constipation.  Denies any headache or visual changes.  He is here today for evaluation to review his scan results and for more detailed discussion about his current condition and recommended treatment options.    MEDICAL HISTORY: Past Medical History:  Diagnosis Date   Arthritis    Bacteremia    2016 or so with hospitalization   Emphysema of lung (HCC) 07/20/2019   Inguinal hernia recurrent unilateral    herniation of mesentary and loops of bowel to right scrotal sac, 4.5 cm defect   Lesion of right lung 07/20/2019   Lumbar radiculopathy 02/15/2021   Pulmonary emboli (HCC) 07/20/2019    ALLERGIES:  has No Known Allergies.  MEDICATIONS:  Current Outpatient Medications  Medication Sig Dispense Refill   acetaminophen (TYLENOL) 500 MG tablet Take 1,000 mg by mouth every  8 (eight) hours as needed for moderate pain. (Patient not taking: Reported on 01/13/2023)     albuterol (VENTOLIN HFA) 108 (90 Base) MCG/ACT inhaler Inhale 2 puffs into the lungs every 4 (four) hours as needed for wheezing or shortness of breath. 8.5 g 6   buPROPion (WELLBUTRIN XL) 150 MG 24 hr tablet Take 1 tablet (150 mg total) by mouth daily. For smoking cessation (Patient not taking: Reported on 01/13/2023) 30 tablet 6   Fluticasone-Umeclidin-Vilant (TRELEGY ELLIPTA) 100-62.5-25 MCG/ACT AEPB Inhale 1 puff into the lungs daily. (Patient not taking: Reported on 01/13/2023) 60 each 6   melatonin 3 MG TABS tablet Take 1 tablet (3 mg total) by mouth at bedtime. (Patient not taking: Reported on 01/13/2023) 30 tablet 3   Multiple Vitamin (MULTIVITAMIN WITH MINERALS) TABS tablet Take 1 tablet by mouth daily. (Patient not taking: Reported on 01/13/2023) 30 tablet 0   tamsulosin (FLOMAX) 0.4 MG CAPS capsule Take 1 capsule (0.4 mg total) by mouth daily. (Patient not taking: Reported on 01/13/2023) 30 capsule 6   thiamine 100 MG tablet Take 1 tablet (100 mg total) by mouth daily. (Patient not taking: Reported on 01/13/2023) 30 tablet 0   traZODone (DESYREL) 50 MG tablet Take 1 tablet (50 mg total) by mouth at bedtime as needed for sleep. (Patient not taking: Reported on 01/13/2023) 30 tablet 6   No current facility-administered medications for this visit.    SURGICAL HISTORY:  Past Surgical History:  Procedure Laterality Date   BRONCHIAL BIOPSY  12/16/2022  Procedure: BRONCHIAL BIOPSIES;  Surgeon: Marcus Peer, MD;  Location[facility] ENDOSCOPY;  Service: Pulmonary;;   BRONCHIAL BRUSHINGS  12/16/2022   Procedure: BRONCHIAL BRUSHINGS;  Surgeon: Marcus Peer, MD;  Location: [facility] ENDOSCOPY;  Service: Pulmonary;;   BRONCHIAL NEEDLE ASPIRATION BIOPSY  12/16/2022   Procedure: BRONCHIAL NEEDLE ASPIRATION BIOPSIES;  Surgeon: Marcus Peer, MD;  Location: [facility] ENDOSCOPY;  Service: Pulmonary;;   FIDUCIAL MARKER PLACEMENT  12/16/2022    Procedure: FIDUCIAL MARKER PLACEMENT;  Surgeon: Marcus Peer, MD;  Location: [facility] ENDOSCOPY;  Service: Pulmonary;;   NO PAST SURGERIES      REVIEW OF SYSTEMS:   Review of Systems  Constitutional: Negative for appetite change, chills, fatigue, fever and unexpected weight change.  HENT:   Negative for mouth sores, nosebleeds, sore throat and trouble swallowing.   Eyes: Negative for eye problems and icterus.  Respiratory: Negative for cough, hemoptysis, shortness of breath and wheezing.   Cardiovascular: Negative for chest pain and leg swelling.  Gastrointestinal: Negative for abdominal pain, constipation, diarrhea, nausea and vomiting.  Genitourinary: Negative for bladder incontinence, difficulty urinating, dysuria, frequency and hematuria.   Musculoskeletal: Negative for back pain, gait problem, neck pain and neck stiffness.  Skin: Negative for itching and rash.  Neurological: Negative for dizziness, extremity weakness, gait problem, headaches, light-headedness and seizures.  Hematological: Negative for adenopathy. Does not bruise/bleed easily.  Psychiatric/Behavioral: Negative for confusion, depression and sleep disturbance. The patient is not nervous/anxious.     PHYSICAL EXAMINATION:  There were no vitals taken for this visit.  ECOG PERFORMANCE STATUS: {CHL ONC ECOG [id number]  Physical Exam  Constitutional: Oriented to person, place, and time and well-developed, well-nourished, and in no distress. No distress.  HENT:  Head: Normocephalic and atraumatic.  Mouth/Throat: Oropharynx is clear and moist. No oropharyngeal exudate.  Eyes: Conjunctivae are normal. Right eye exhibits no discharge. Left eye exhibits no discharge. No scleral icterus.  Neck: Normal range of motion. Neck supple.  Cardiovascular: Normal rate, regular rhythm, normal heart sounds and intact distal pulses.   Pulmonary/Chest: Effort normal and breath sounds normal. No respiratory distress. No wheezes. No  rales.  Abdominal: Soft. Bowel sounds are normal. Exhibits no distension and no mass. There is no tenderness.  Musculoskeletal: Normal range of motion. Exhibits no edema.  Lymphadenopathy:    No cervical adenopathy.  Neurological: Alert and oriented to person, place, and time. Exhibits normal muscle tone. Gait normal. Coordination normal.  Skin: Skin is warm and dry. No rash noted. Not diaphoretic. No erythema. No pallor.  Psychiatric: Mood, memory and judgment normal.  Vitals reviewed.  LABORATORY DATA: Lab Results  Component Value Date   WBC 7.4 01/02/2023   HGB 16.1 01/02/2023   HCT 47.2 01/02/2023   MCV 87.7 01/02/2023   PLT 243 01/02/2023      Chemistry      Component Value Date/Time   NA 141 01/02/2023 1328   NA 142 12/05/2021 1610   K 4.9 01/02/2023 1328   CL 106 01/02/2023 1328   CO2 30 01/02/2023 1328   BUN 12 01/02/2023 1328   BUN 10 12/05/2021 1610   CREATININE 1.05 01/02/2023 1328      Component Value Date/Time   CALCIUM 9.9 01/02/2023 1328   ALKPHOS 73 01/02/2023 1328   AST 31 01/02/2023 1328   ALT 32 01/02/2023 1328   BILITOT 1.3 (H) 01/02/2023 1328       RADIOGRAPHIC STUDIES:  NM PET Image Initial (PI) Skull Base To Thigh  Result Date:  01/13/2023 CLINICAL DATA:  Initial treatment strategy for non-small cell lung cancer. EXAM: NUCLEAR MEDICINE PET SKULL BASE TO THIGH TECHNIQUE: 6.8 mCi F-18 FDG was injected intravenously. Full-ring PET imaging was performed from the skull base to thigh after the radiotracer. CT data was obtained and used for attenuation correction and anatomic localization. Fasting blood glucose: 101 mg/dl COMPARISON:  CT chest dated 12/13/2022.  PET-CT dated 07/02/2021. FINDINGS: Mediastinal blood pool activity: SUV max 2.1 Liver activity: SUV max NA NECK: No hypermetabolic cervical lymphadenopathy. Incidental CT findings: None. CHEST: No hypermetabolic thoracic lymphadenopathy. 1.4 x 2.0 cm predominantly solid nodule in the right lower  lobe (series 7/image 58), previously mixed density on remote priors, highly suspicious for invasive adenocarcinoma. Max SUV 2.6. Associated biopsy clip/marker. 12 x 8 mm nodule in the superior segment right lower lobe (series 7/image 42), poorly evaluated due to motion degradation, grossly unchanged from remote priors. However, this demonstrates max SUV 6.0, and therefore is also considered suspicious for indolent primary bronchogenic carcinoma. Associated biopsy clip/marker. Two adjacent 5 mm right upper lobe nodules (series 7/image 30), max SUV 1.9, indeterminate. Additional right upper lobe nodules/nodular opacities with overall waxing/waning appearance. As such, this favors chronic infection/inflammation. Incidental CT findings: Mild centrilobular and paraseptal emphysematous changes, upper lung predominant, with bullous changes in the right lower lobe. Mild coronary atherosclerosis of the LAD and left circumflex. ABDOMEN/PELVIS: No abnormal hypermetabolism in the liver, spleen, pancreas, or adrenal glands. No hypermetabolic abdominopelvic lymphadenopathy. Incidental CT findings: Prostatomegaly, suggesting BPH. Large right inguinal/scrotal hernia containing multiple loops of nondilated bowel. Atherosclerotic calcifications of the abdominal aorta and branch vessels. SKELETON: No focal hypermetabolic activity to suggest skeletal metastasis. Incidental CT findings: Degenerative changes of the visualized thoracolumbar spine and right hip. IMPRESSION: Progressive 2.0 cm right lower lobe nodule, highly suspicious for invasive adenocarcinoma. Stable 12 mm nodule in the superior segment right lower lobe, suspicious for indolent primary bronchogenic carcinoma given hypermetabolism. Two adjacent 5 mm right upper lobe nodules, indeterminate and favored to be waxing/waning, suggesting chronic infection/inflammation. Continued attention on follow-up is suggested. No findings suspicious for metastatic disease. Electronically  Signed   By: Charline Bills M.D.   On: 01/13/2023 09:10   MR BRAIN W WO CONTRAST  Result Date: 01/13/2023 CLINICAL DATA:  67 year old male with lung nodules, non small cell lung cancer. Staging. EXAM: MRI HEAD WITHOUT AND WITH CONTRAST TECHNIQUE: Multiplanar, multiecho pulse sequences of the brain and surrounding structures were obtained without and with intravenous contrast. CONTRAST:  6mL GADAVIST GADOBUTROL 1 MMOL/ML IV SOLN COMPARISON:  None Available. FINDINGS: Brain: Cerebral volume is within normal limits for age. No restricted diffusion to suggest acute infarction. No midline shift, mass effect, evidence of mass lesion, ventriculomegaly, extra-axial collection or acute intracranial hemorrhage. Cervicomedullary junction and pituitary are within normal limits. No abnormal enhancement identified.  No dural thickening. Mostly normal for age gray and white matter signal throughout the brain. There is mild mostly periventricular, occasionally subcortical, nonspecific scattered white matter T2/FLAIR hyperintensity. No cortical encephalomalacia. No chronic cerebral blood products on SWI. Deep gray nuclei and cerebellum appear negative. There is mild T2 heterogeneity in pons. Vascular: Major intracranial vascular flow voids are preserved. Following contrast the major dural venous sinuses are enhancing and appear to be patent. Skull and upper cervical spine: Partially visible cervical spine degeneration with evidence of degenerative spinal stenosis at C3-C4 on series 7, image 11. Visualized bone marrow signal is within normal limits. Sinuses/Orbits: Negative orbits. Paranasal sinuses and mastoids are well aerated. Other: Visible  internal auditory structures appear normal. Negative visible scalp and face. IMPRESSION: 1. No metastatic disease or acute intracranial abnormality. 2. Mild for age signal changes compatible with chronic small vessel disease. 3. Partially visible cervical spine degeneration with evidence  of degenerative spinal stenosis at C3-C4. Electronically Signed   By: Odessa Fleming M.D.   On: 01/13/2023 08:59   DG Chest Port 1 View  Result Date: 12/16/2022 CLINICAL DATA:  Status post bronchoscopy EXAM: PORTABLE CHEST 1 VIEW COMPARISON:  CT chest, 12/13/2022 FINDINGS: Biopsy marking clip within nodules of the inferior right lower lobe and perihilar right lower lobe. Emphysema with a large bulla of the posterior right lung and bandlike scarring of the peripheral left midlung. Heart and mediastinum unremarkable. Osseous structures unremarkable. IMPRESSION: 1. Biopsy marking clip within nodules of the inferior right lower lobe and perihilar right lower lobe. 2. Emphysema with a large bulla of the posterior right lung and bandlike scarring of the peripheral left midlung. Electronically Signed   By: Jearld Lesch M.D.   On: 12/16/2022 14:58   DG C-ARM BRONCHOSCOPY  Result Date: 12/16/2022 C-ARM BRONCHOSCOPY: Fluoroscopy was utilized by the requesting physician.  No radiographic interpretation.     ASSESSMENT/PLAN:  Stage IIB (T3, N0, M0) non-small cell lung cancer, adenocarcinoma. He presented with 2 pulmonary nodules in the right lower lobe. ***NO Lnls***in addition to suspicious mediastinal lymphadenopathy   He was diagnosed in August 2024.  The patient is planning on undergoing ***to the care of Dr. Roselind Pace.  The patient was seen with Dr. Arbutus Pace today.  Dr. Arbutus Pace personally and independently reviewed the PET scan and the brain MRI.  The scan showed ***.   Dr. Arbutus Pace recommends the patient undergo radiation.  We will follow the patient for surveillance.  I will arrange for a CT of the chest to be performed in approximately 4 months.  Looking cessation??  Molecular's  The patient was advised to call immediately if he has any concerning symptoms in the interval. The patient voices understanding of current disease status and treatment options and is in agreement with the current care  plan. All questions were answered. The patient knows to call the clinic with any problems, questions or concerns. We can certainly see the patient much sooner if necessary  No orders of the defined types were placed in this encounter.    I spent {CHL ONC TIME VISIT - [id number]} counseling the patient face to face. The total time spent in the appointment was {CHL ONC TIME VISIT - [id number]}.  Anayah Arvanitis L Jacobs Golab, PA-C 01/13/23

## 2023-01-14 ENCOUNTER — Other Ambulatory Visit: Payer: Self-pay

## 2023-01-15 ENCOUNTER — Ambulatory Visit: Payer: Medicare HMO | Admitting: Radiation Oncology

## 2023-01-16 ENCOUNTER — Inpatient Hospital Stay: Payer: Medicare HMO | Attending: Internal Medicine | Admitting: Physician Assistant

## 2023-01-16 ENCOUNTER — Other Ambulatory Visit: Payer: Self-pay

## 2023-01-16 VITALS — BP 139/81 | HR 84 | Temp 98.2°F | Resp 19 | Ht 71.0 in | Wt 136.7 lb

## 2023-01-16 DIAGNOSIS — F17200 Nicotine dependence, unspecified, uncomplicated: Secondary | ICD-10-CM | POA: Diagnosis not present

## 2023-01-16 DIAGNOSIS — C3431 Malignant neoplasm of lower lobe, right bronchus or lung: Secondary | ICD-10-CM | POA: Diagnosis present

## 2023-01-16 DIAGNOSIS — Z79899 Other long term (current) drug therapy: Secondary | ICD-10-CM | POA: Diagnosis not present

## 2023-01-16 NOTE — Progress Notes (Signed)
Called [facility] Pathology. Needs PDL1 and foundation one. C34.92 diagnosis code. Stage is stage II

## 2023-01-20 ENCOUNTER — Ambulatory Visit
Admission: RE | Admit: 2023-01-20 | Discharge: 2023-01-20 | Disposition: A | Discharge status: Home / Self Care | Payer: Medicare HMO | Source: Ambulatory Visit | Attending: Radiation Oncology | Admitting: Radiation Oncology

## 2023-01-20 DIAGNOSIS — Z510 Encounter for antineoplastic radiation therapy: Secondary | ICD-10-CM | POA: Insufficient documentation

## 2023-01-20 DIAGNOSIS — C3431 Malignant neoplasm of lower lobe, right bronchus or lung: Secondary | ICD-10-CM | POA: Diagnosis present

## 2023-01-20 DIAGNOSIS — F17200 Nicotine dependence, unspecified, uncomplicated: Secondary | ICD-10-CM | POA: Diagnosis not present

## 2023-01-22 ENCOUNTER — Ambulatory Visit: Payer: Medicare HMO | Admitting: Radiation Oncology

## 2023-01-24 ENCOUNTER — Encounter (HOSPITAL_COMMUNITY): Payer: Self-pay

## 2023-01-26 DIAGNOSIS — Z510 Encounter for antineoplastic radiation therapy: Secondary | ICD-10-CM | POA: Diagnosis not present

## 2023-01-28 ENCOUNTER — Encounter (HOSPITAL_COMMUNITY): Payer: Self-pay

## 2023-01-29 ENCOUNTER — Other Ambulatory Visit: Payer: Self-pay

## 2023-01-29 ENCOUNTER — Ambulatory Visit
Admission: RE | Admit: 2023-01-29 | Discharge: 2023-01-29 | Disposition: A | Discharge status: Home / Self Care | Payer: Medicare HMO | Source: Ambulatory Visit | Attending: Radiation Oncology | Admitting: Radiation Oncology

## 2023-01-29 DIAGNOSIS — Z510 Encounter for antineoplastic radiation therapy: Secondary | ICD-10-CM | POA: Diagnosis not present

## 2023-01-29 DIAGNOSIS — C3431 Malignant neoplasm of lower lobe, right bronchus or lung: Secondary | ICD-10-CM

## 2023-01-29 LAB — RAD ONC ARIA SESSION SUMMARY
Course Elapsed Days: 0
Plan Fractions Treated to Date: 1
Plan Prescribed Dose Per Fraction: 18 Gy
Plan Total Fractions Prescribed: 3
Plan Total Prescribed Dose: 54 Gy
Reference Point Dosage Given to Date: 18 Gy
Reference Point Session Dosage Given: 18 Gy
Session Number: 1

## 2023-01-31 ENCOUNTER — Other Ambulatory Visit: Payer: Self-pay

## 2023-01-31 ENCOUNTER — Ambulatory Visit
Admission: RE | Admit: 2023-01-31 | Discharge: 2023-01-31 | Disposition: A | Discharge status: Home / Self Care | Payer: Medicare HMO | Source: Ambulatory Visit | Attending: Radiation Oncology | Admitting: Radiation Oncology

## 2023-01-31 DIAGNOSIS — C3431 Malignant neoplasm of lower lobe, right bronchus or lung: Secondary | ICD-10-CM

## 2023-01-31 DIAGNOSIS — Z510 Encounter for antineoplastic radiation therapy: Secondary | ICD-10-CM | POA: Diagnosis not present

## 2023-01-31 LAB — RAD ONC ARIA SESSION SUMMARY
Course Elapsed Days: 2
Plan Fractions Treated to Date: 2
Plan Prescribed Dose Per Fraction: 18 Gy
Plan Total Fractions Prescribed: 3
Plan Total Prescribed Dose: 54 Gy
Reference Point Dosage Given to Date: 36 Gy
Reference Point Session Dosage Given: 18 Gy
Session Number: 2

## 2023-02-03 ENCOUNTER — Ambulatory Visit
Admission: RE | Admit: 2023-02-03 | Discharge: 2023-02-03 | Disposition: A | Discharge status: Home / Self Care | Payer: Medicare HMO | Source: Ambulatory Visit | Attending: Radiation Oncology | Admitting: Radiation Oncology

## 2023-02-03 ENCOUNTER — Other Ambulatory Visit: Payer: Self-pay

## 2023-02-03 DIAGNOSIS — Z510 Encounter for antineoplastic radiation therapy: Secondary | ICD-10-CM | POA: Diagnosis not present

## 2023-02-03 DIAGNOSIS — C3431 Malignant neoplasm of lower lobe, right bronchus or lung: Secondary | ICD-10-CM

## 2023-02-03 LAB — RAD ONC ARIA SESSION SUMMARY
Course Elapsed Days: 5
Plan Fractions Treated to Date: 3
Plan Prescribed Dose Per Fraction: 18 Gy
Plan Total Fractions Prescribed: 3
Plan Total Prescribed Dose: 54 Gy
Reference Point Dosage Given to Date: 54 Gy
Reference Point Session Dosage Given: 18 Gy
Session Number: 3

## 2023-02-04 NOTE — Radiation Completion Notes (Signed)
Patient Name: Marcus Pace, Marcus Pace MRN: [id number] Date of Birth: 05-18-1955 Referring Physician: [name], M.D. Date of Service: 2023-02-04 Radiation Oncologist: Arnette Schaumann, M.D.  Cancer Center -                              RADIATION ONCOLOGY END OF TREATMENT NOTE     Diagnosis: C34.31 Malignant neoplasm of lower lobe, right bronchus or lung Staging on 2023-01-02: Primary adenocarcinoma of lower lobe of right lung (HCC) T=cT3, N=cN0, M=cM0 Intent: Curative     ==========DELIVERED PLANS==========  First Treatment Date: 2023-01-29 - Last Treatment Date: 2023-02-03   Plan Name: Lung_R_SBRT Site: Lung, Right Technique: SBRT/SRT-IMRT Mode: Photon Dose Per Fraction: 18 Gy Prescribed Dose (Delivered / Prescribed): 54 Gy / 54 Gy Prescribed Fxs (Delivered / Prescribed): 3 / 3     ==========ON TREATMENT VISIT DATES========== 2023-01-29, 2023-01-31, 2023-02-03, 2023-02-03     ==========UPCOMING VISITS==========       ==========APPENDIX - ON TREATMENT VISIT NOTES==========   See weekly On Treatment Notes in Epic for details.

## 2023-02-07 ENCOUNTER — Encounter: Payer: Self-pay | Admitting: Nurse Practitioner

## 2023-02-07 ENCOUNTER — Ambulatory Visit: Payer: Medicare HMO | Attending: Nurse Practitioner | Admitting: Nurse Practitioner

## 2023-02-07 ENCOUNTER — Other Ambulatory Visit: Payer: Self-pay

## 2023-02-07 VITALS — BP 116/73 | HR 89 | Ht 71.0 in | Wt 136.8 lb

## 2023-02-07 DIAGNOSIS — Z23 Encounter for immunization: Secondary | ICD-10-CM | POA: Diagnosis not present

## 2023-02-07 DIAGNOSIS — G4709 Other insomnia: Secondary | ICD-10-CM | POA: Diagnosis not present

## 2023-02-07 DIAGNOSIS — J439 Emphysema, unspecified: Secondary | ICD-10-CM

## 2023-02-07 DIAGNOSIS — R399 Unspecified symptoms and signs involving the genitourinary system: Secondary | ICD-10-CM

## 2023-02-07 DIAGNOSIS — E785 Hyperlipidemia, unspecified: Secondary | ICD-10-CM

## 2023-02-07 DIAGNOSIS — F17210 Nicotine dependence, cigarettes, uncomplicated: Secondary | ICD-10-CM

## 2023-02-07 DIAGNOSIS — Z720 Tobacco use: Secondary | ICD-10-CM

## 2023-02-07 MED ORDER — TAMSULOSIN HCL 0.4 MG PO CAPS
0.4000 mg | ORAL_CAPSULE | Freq: Every day | ORAL | 6 refills | Status: DC
Start: 2023-02-07 — End: 2024-01-19
  Filled 2023-02-07: qty 30, 30d supply, fill #0
  Filled 2023-05-15: qty 30, 30d supply, fill #1

## 2023-02-07 MED ORDER — TRAZODONE HCL 50 MG PO TABS
50.0000 mg | ORAL_TABLET | Freq: Every evening | ORAL | 6 refills | Status: AC | PRN
  Filled 2023-02-07: qty 30, 30d supply, fill #0
  Filled 2023-05-15: qty 30, 30d supply, fill #1

## 2023-02-07 MED ORDER — BUPROPION HCL ER (XL) 150 MG PO TB24
150.0000 mg | ORAL_TABLET | Freq: Every day | ORAL | 6 refills | Status: DC
  Filled 2023-02-07: qty 30, 30d supply, fill #0
  Filled 2023-05-15: qty 30, 30d supply, fill #1

## 2023-02-07 MED ORDER — TRELEGY ELLIPTA 100-62.5-25 MCG/ACT IN AEPB
1.0000 | INHALATION_SPRAY | Freq: Every day | RESPIRATORY_TRACT | 6 refills | Status: DC
  Filled 2023-02-07: qty 60, 30d supply, fill #0
  Filled 2023-05-15: qty 60, 30d supply, fill #1

## 2023-02-07 NOTE — Progress Notes (Signed)
Assessment & Plan:  Marcus Pace was seen today for medical management of chronic issues.  Diagnoses and all orders for this visit:  Pulmonary emphysema, unspecified emphysema type  Followed by [name] with Pulmonology -     Fluticasone-Umeclidin-Vilant (TRELEGY ELLIPTA) 100-62.5-25 MCG/ACT AEPB; Inhale 1 puff into the lungs daily.  Tobacco abuse Continues smoking. Trying to quit -     buPROPion (WELLBUTRIN XL) 150 MG 24 hr tablet; Take 1 tablet (150 mg total) by mouth daily. For smoking cessation  Lower urinary tract symptoms -     tamsulosin (FLOMAX) 0.4 MG CAPS capsule; Take 1 capsule (0.4 mg total) by mouth daily. -     PSA  Other insomnia Well controlled with trazodone -     traZODone (DESYREL) 50 MG tablet; Take 1 tablet (50 mg total) by mouth at bedtime as needed for sleep.  Dyslipidemia, goal LDL below 100 -     Lipid panel INSTRUCTIONS: Work on a low fat, heart healthy diet and participate in regular aerobic exercise program by working out at least 150 minutes per week; 5 days a week-30 minutes per day. Avoid red meat/beef/steak,  fried foods. junk foods, sodas, sugary drinks, unhealthy snacking, alcohol and smoking.  Drink at least 80 oz of water per day and monitor your carbohydrate intake daily.      Patient has been counseled on age-appropriate routine health concerns for screening and prevention. These are reviewed and up-to-date. Referrals have been placed accordingly. Immunizations are up-to-date or declined.    Subjective:   Chief Complaint  Patient presents with   Medical Management of Chronic Issues   HPI Marcus Pace 67 y.o. male presents to office today for medication refills. He is a patient of Dr. Alvis Lemmings.  He has a past medical history of Arthritis, Bacteremia, Emphysema of lung (07/20/2019), Inguinal hernia recurrent unilateral, Primary adenocarcinoma of right lung (07/20/2019), Lumbar radiculopathy (02/15/2021), and Pulmonary emboli(07/20/2019),  PNA   Review of Systems  Constitutional:  Negative for fever, malaise/fatigue and weight loss.  HENT: Negative.  Negative for nosebleeds.   Eyes: Negative.  Negative for blurred vision, double vision and photophobia.  Respiratory: Negative.  Negative for cough and shortness of breath.   Cardiovascular: Negative.  Negative for chest pain, palpitations and leg swelling.  Gastrointestinal: Negative.  Negative for heartburn, nausea and vomiting.  Musculoskeletal: Negative.  Negative for myalgias.  Neurological: Negative.  Negative for dizziness, focal weakness, seizures and headaches.  Psychiatric/Behavioral: Negative.  Negative for suicidal ideas.     Past Medical History:  Diagnosis Date   Arthritis    Bacteremia    2016 or so with hospitalization   Emphysema of lung (HCC) 07/20/2019   Inguinal hernia recurrent unilateral    herniation of mesentary and loops of bowel to right scrotal sac, 4.5 cm defect   Lesion of right lung 07/20/2019   Lumbar radiculopathy 02/15/2021   Pulmonary emboli (HCC) 07/20/2019    Past Surgical History:  Procedure Laterality Date   BRONCHIAL BIOPSY  12/16/2022   Procedure: BRONCHIAL BIOPSIES;  Surgeon: [name], MD;  Location: [facility] ENDOSCOPY;  Service: Pulmonary;;   BRONCHIAL BRUSHINGS  12/16/2022   Procedure: BRONCHIAL BRUSHINGS;  Surgeon: [name], MD;  Location: [facility] ENDOSCOPY;  Service: Pulmonary;;   BRONCHIAL NEEDLE ASPIRATION BIOPSY  12/16/2022   Procedure: BRONCHIAL NEEDLE ASPIRATION BIOPSIES;  Surgeon: [name], MD;  Location: [facility] ENDOSCOPY;  Service: Pulmonary;;   FIDUCIAL MARKER PLACEMENT  12/16/2022   Procedure: FIDUCIAL MARKER  PLACEMENT;  Surgeon: [name], MD;  Location: [facility] ENDOSCOPY;  Service: Pulmonary;;   NO PAST SURGERIES      Family History  Problem Relation Age of Onset   Cancer Mother    Heart disease Father     Social History Reviewed with no changes to be made today.   Outpatient Medications Prior to Visit   Medication Sig Dispense Refill   buPROPion (WELLBUTRIN XL) 150 MG 24 hr tablet Take 1 tablet (150 mg total) by mouth daily. For smoking cessation 30 tablet 6   Fluticasone-Umeclidin-Vilant (TRELEGY ELLIPTA) 100-62.5-25 MCG/ACT AEPB Inhale 1 puff into the lungs daily. 60 each 6   tamsulosin (FLOMAX) 0.4 MG CAPS capsule Take 1 capsule (0.4 mg total) by mouth daily. 30 capsule 6   traZODone (DESYREL) 50 MG tablet Take 1 tablet (50 mg total) by mouth at bedtime as needed for sleep. 30 tablet 6   acetaminophen (TYLENOL) 500 MG tablet Take 1,000 mg by mouth every 8 (eight) hours as needed for moderate pain. (Patient not taking: Reported on 01/13/2023)     albuterol (VENTOLIN HFA) 108 (90 Base) MCG/ACT inhaler Inhale 2 puffs into the lungs every 4 (four) hours as needed for wheezing or shortness of breath. 8.5 g 6   melatonin 3 MG TABS tablet Take 1 tablet (3 mg total) by mouth at bedtime. (Patient not taking: Reported on 01/13/2023) 30 tablet 3   Multiple Vitamin (MULTIVITAMIN WITH MINERALS) TABS tablet Take 1 tablet by mouth daily. (Patient not taking: Reported on 01/13/2023) 30 tablet 0   thiamine 100 MG tablet Take 1 tablet (100 mg total) by mouth daily. (Patient not taking: Reported on 01/13/2023) 30 tablet 0   No facility-administered medications prior to visit.    No Known Allergies     Objective:    BP 116/73   Pulse 89   Ht 5\' 11"  (1.803 m)   Wt 136 lb 12.8 oz (62.1 kg)   SpO2 93%   BMI 19.08 kg/m  Wt Readings from Last 3 Encounters:  02/07/23 136 lb 12.8 oz (62.1 kg)  01/16/23 136 lb 11.2 oz (62 kg)  01/13/23 136 lb 4 oz (61.8 kg)    Physical Exam Vitals and nursing note reviewed.  Constitutional:      Appearance: He is well-developed.  HENT:     Head: Normocephalic and atraumatic.  Cardiovascular:     Rate and Rhythm: Normal rate and regular rhythm.     Heart sounds: Normal heart sounds. No murmur heard.    No friction rub. No gallop.  Pulmonary:     Effort: Pulmonary effort  is normal. No tachypnea or respiratory distress.     Breath sounds: Normal breath sounds. No decreased breath sounds, wheezing, rhonchi or rales.  Chest:     Chest wall: No tenderness.  Abdominal:     General: Bowel sounds are normal.     Palpations: Abdomen is soft.  Musculoskeletal:        General: Normal range of motion.     Cervical back: Normal range of motion.  Skin:    General: Skin is warm and dry.  Neurological:     Mental Status: He is alert and oriented to person, place, and time.     Coordination: Coordination normal.  Psychiatric:        Behavior: Behavior normal. Behavior is cooperative.        Thought Content: Thought content normal.        Judgment: Judgment normal.  Patient has been counseled extensively about nutrition and exercise as well as the importance of adherence with medications and regular follow-up. The patient was given clear instructions to go to ER or return to medical center if symptoms don't improve, worsen or new problems develop. The patient verbalized understanding.   Follow-up: Return in about 3 months (around 05/10/2023).   Claiborne Rigg, FNP-BC [facility] [place], [place] [phone]   02/07/2023, 1:59 PM

## 2023-02-08 LAB — LIPID PANEL
Chol/HDL Ratio: 2.3 {ratio} (ref 0.0–5.0)
Cholesterol, Total: 163 mg/dL (ref 100–199)
HDL: 70 mg/dL (ref >39)
LDL Chol Calc (NIH): 74 mg/dL (ref 0–99)
Triglycerides: 105 mg/dL (ref 0–149)
VLDL Cholesterol Cal: 19 mg/dL (ref 5–40)

## 2023-02-08 LAB — PSA: Prostate Specific Ag, Serum: 1.2 ng/mL (ref 0.0–4.0)

## 2023-03-04 ENCOUNTER — Encounter: Payer: Self-pay | Admitting: Radiation Oncology

## 2023-03-05 NOTE — Progress Notes (Signed)
Radiation Oncology         [phone] ________________________________  Name: Marcus Pace MRN: [id number]  Date: 03/06/2023  DOB: [birth year]  End of Treatment Note  Diagnosis: The primary encounter diagnosis was Adenocarcinoma of lower lobe of right lung (HCC). A diagnosis of Primary adenocarcinoma of lower lobe of right lung [facility]) was also pertinent to this visit.   Stage IIB adenocarcinoma of the right lower lobe    Cancer Staging  Primary adenocarcinoma of lower lobe of right lung (HCC) Staging form: Lung, AJCC 8th Edition - Clinical: Stage IIB (cT3, cN0, cM0) - Signed by [name], MD on 01/02/2023  Indication for treatment: Curative        Radiation treatment dates: 01/29/23 through 02/03/23   Site/dose: Right lung - 54 Gy delivered in 3 Fx at 18 Gy/Fx   Technique/Mode: SBRT/SRT-IMRT / Photon   Beams/energy: 6X-FFF  Narrative: The patient tolerated radiation treatment very well without any significant side effects.    Plan: The patient has completed radiation treatment. The patient will return to radiation oncology clinic for routine followup in one month. I advised them to call or return sooner if they have any questions or concerns related to their recovery or treatment.  -----------------------------------  Billie Lade, PhD, MD  This document serves as a record of services personally performed by Antony Blackbird, MD. It was created on his behalf by Neena Rhymes, a trained medical scribe. The creation of this record is based on the scribe's personal observations and the provider's statements to them. This document has been checked and approved by the attending provider.

## 2023-03-06 ENCOUNTER — Encounter: Payer: Self-pay | Admitting: Radiation Oncology

## 2023-03-06 ENCOUNTER — Ambulatory Visit
Admission: RE | Admit: 2023-03-06 | Discharge: 2023-03-06 | Disposition: A | Discharge status: Home / Self Care | Payer: Medicare HMO | Source: Ambulatory Visit | Attending: Radiation Oncology | Admitting: Radiation Oncology

## 2023-03-06 ENCOUNTER — Other Ambulatory Visit: Payer: Self-pay

## 2023-03-06 VITALS — BP 132/74 | HR 100 | Temp 98.0°F | Resp 18 | Ht 71.0 in | Wt 135.6 lb

## 2023-03-06 DIAGNOSIS — C3431 Malignant neoplasm of lower lobe, right bronchus or lung: Secondary | ICD-10-CM | POA: Insufficient documentation

## 2023-03-06 DIAGNOSIS — Z923 Personal history of irradiation: Secondary | ICD-10-CM | POA: Diagnosis not present

## 2023-03-06 DIAGNOSIS — Z720 Tobacco use: Secondary | ICD-10-CM

## 2023-03-06 HISTORY — DX: Personal history of irradiation: Z92.3

## 2023-03-06 MED ORDER — NICOTINE 14 MG/24HR TD PT24
14.0000 mg | MEDICATED_PATCH | Freq: Every day | TRANSDERMAL | 0 refills | Status: DC
  Filled 2023-03-06: qty 14, 14d supply, fill #0

## 2023-03-06 MED ORDER — NICOTINE 7 MG/24HR TD PT24
7.0000 mg | MEDICATED_PATCH | Freq: Every day | TRANSDERMAL | 0 refills | Status: DC
  Filled 2023-03-06: qty 14, 14d supply, fill #0

## 2023-03-06 NOTE — Progress Notes (Signed)
Radiation Oncology         ([phone]) [phone] ________________________________  Name: Marcus Pace MRN: [id number]  Date: 03/06/2023  DOB: 08-27-1955  Follow-Up Visit Note  CC: Marcus Register, MD  [name], MD    ICD-10-CM   1. 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 He tolerated radiation treatment relatively well without any significant side effects.   No significant oncologic interval history since the patient completed radiation, or in the interval since his initial consultation date. He will continue to follow with medical oncology under surveillance.   The patient reports to be doing well overall today. He continues to experience dyspnea on exertion, but otherwise denies any cough, hemoptysis, or any other changes to his breathing. He smokes ~3 cigarettes per day and uses his inhaler frequently.                            Allergies:  has No Known Allergies.  Meds: Current Outpatient Medications  Medication Sig Dispense Refill   acetaminophen (TYLENOL) 500 MG tablet Take 1,000 mg by mouth every 8 (eight) hours as needed for moderate pain (pain score 4-6).     albuterol (VENTOLIN HFA) 108 (90 Base)  MCG/ACT inhaler Inhale 2 puffs into the lungs every 4 (four) hours as needed for wheezing or shortness of breath. 8.5 g 6   buPROPion (WELLBUTRIN XL) 150 MG 24 hr tablet Take 1 tablet (150 mg total) by mouth daily. For smoking cessation 30 tablet 6   Fluticasone-Umeclidin-Vilant (TRELEGY ELLIPTA) 100-62.5-25 MCG/ACT AEPB Inhale 1 puff into the lungs daily. 60 each 6   nicotine (NICODERM CQ - DOSED IN MG/24 HOURS) 14 mg/24hr patch Place 1 patch (14 mg total) onto the skin daily. Apply 14 mg patch daily x 6 wk, then 7 mg patch daily x 2 wk 14 patch 0   nicotine (NICODERM CQ - DOSED IN MG/24 HR) 7 mg/24hr patch Place 1 patch (7 mg total) onto the skin daily. Apply 14 mg patch daily x 6 wk, then 7 mg patch daily x 2 wk 14 patch 0   tamsulosin (FLOMAX) 0.4 MG CAPS capsule Take 1 capsule (0.4 mg total) by mouth daily. 30 capsule 6   traZODone (DESYREL) 50 MG tablet Take 1 tablet (50 mg total) by mouth at bedtime as needed for sleep. 30 tablet 6   melatonin 3 MG TABS tablet Take 1 tablet (3 mg total) by mouth at bedtime. (Patient not taking: Reported on 03/06/2023) 30 tablet 3   Multiple Vitamin (MULTIVITAMIN WITH MINERALS) TABS tablet Take 1 tablet by mouth daily. (Patient  not taking: Reported on 01/13/2023) 30 tablet 0   thiamine 100 MG tablet Take 1 tablet (100 mg total) by mouth daily. (Patient not taking: Reported on 01/13/2023) 30 tablet 0   No current facility-administered medications for this encounter.    Physical Findings: The patient is in no acute distress. Patient is alert and oriented.  height is 5\' 11"  (1.803 m) and weight is 135 lb 9.6 oz (61.5 kg). His oral temperature is 98 F (36.7 C). His blood pressure is 132/74 and his pulse is 100. His respiration is 18 and oxygen saturation is 95%. .  No significant changes. Lungs are clear to auscultation bilaterally. Heart has regular rate and rhythm. No palpable cervical, supraclavicular, or axillary adenopathy. Abdomen soft, non-tender, normal bowel  sounds.   Lab Findings: Lab Results  Component Value Date   WBC 7.4 01/02/2023   HGB 16.1 01/02/2023   HCT 47.2 01/02/2023   MCV 87.7 01/02/2023   PLT 243 01/02/2023    Radiographic Findings: No results found.  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   He is still smoking approximately 3 cigarettes/day. We discussed at length the benefits of smoking cessation and the patient expressed interest in quitting. He picked a quit date of November 7th.   Plan:  Patient will continue follow-up under the care of Dr. Arbutus Ped. He is scheduled to see him after CT of the chest on 05/15/2023. We appreciate the opportunity to take part in this patient's care. He knows to call with any questions or concerns.   Prescription for nicotine patches sent to his pharmacy to aid in smoking cessation.   20 minutes of total time was spent for this patient encounter, including preparation, face-to-face counseling with the patient and coordination of care, physical exam, and documentation of the encounter. ____________________________________   Joyice Faster, PA-C   Billie Lade, PhD, MD  This document serves as a record of services personally performed by Antony Blackbird, MD. It was created on his behalf by Neena Rhymes, a trained medical scribe. The creation of this record is based on the scribe's personal observations and the provider's statements to them. This document has been checked and approved by the attending provider.

## 2023-03-06 NOTE — Progress Notes (Signed)
Marcus Pace is here today for follow up post radiation to the lung.  Lung Side: Right  Does the patient complain of any of the following: Pain: No Shortness of breath w/wo exertion: No Cough: No Hemoptysis: No Pain with swallowing: No Swallowing/choking concerns: No Appetite: Good Energy Level: Fair Post radiation skin Changes: No   BP 132/74 (BP Location: Right Arm, Patient Position: Sitting)   Pulse 100   Temp 98 F (36.7 C) (Oral)   Resp 18   Ht 5\' 11"  (1.803 m)   Wt 135 lb 9.6 oz (61.5 kg)   SpO2 95%   BMI 18.91 kg/m

## 2023-04-01 ENCOUNTER — Ambulatory Visit: Payer: Medicare HMO | Attending: Family Medicine

## 2023-04-01 VITALS — Ht 72.0 in | Wt 135.0 lb

## 2023-04-01 DIAGNOSIS — Z0000 Encounter for general adult medical examination without abnormal findings: Secondary | ICD-10-CM

## 2023-04-01 DIAGNOSIS — Z0100 Encounter for examination of eyes and vision without abnormal findings: Secondary | ICD-10-CM

## 2023-04-01 DIAGNOSIS — Z1211 Encounter for screening for malignant neoplasm of colon: Secondary | ICD-10-CM

## 2023-04-01 DIAGNOSIS — Z87891 Personal history of nicotine dependence: Secondary | ICD-10-CM

## 2023-04-01 NOTE — Progress Notes (Signed)
Because this visit was a virtual/telehealth visit,  certain criteria was not obtained, such a blood pressure, CBG if applicable, and timed get up and go. Any medications not marked as "taking" were not mentioned during the medication reconciliation part of the visit. Any vitals not documented were not able to be obtained due to this being a telehealth visit or patient was unable to self-report a recent blood pressure reading due to a lack of equipment at home via telehealth. Vitals that have been documented are verbally provided by the patient.   Subjective:   Marcus Pace is a 67 y.o. male who presents for Medicare Annual/Subsequent preventive examination.  Visit Complete: Virtual I connected with  Marcus Pace on 04/01/23 by a audio enabled telemedicine application and verified that I am speaking with the correct person using two identifiers.  Patient Location: Home  Provider Location: Home Office  I discussed the limitations of evaluation and management by telemedicine. The patient expressed understanding and agreed to proceed.  Vital Signs: Because this visit was a virtual/telehealth visit, some criteria may be missing or patient reported. Any vitals not documented were not able to be obtained and vitals that have been documented are patient reported.  Patient Medicare AWV questionnaire was completed by the patient on na; I have confirmed that all information answered by patient is correct and no changes since this date.  Cardiac Risk Factors include: advanced age (>32men, >98 women);male gender;sedentary lifestyle;smoking/ tobacco exposure;Other (see comment), Risk factor comments: Emphysema, COPD     Objective:    Today's Vitals   04/01/23 1452  Weight: 135 lb (61.2 kg)  Height: 6' (1.829 m)   Body mass index is 18.31 kg/m.     04/01/2023    2:56 PM 03/06/2023    4:21 PM 01/13/2023    8:25 AM 01/13/2023    8:24 AM 12/16/2022   11:04 AM 11/15/2020    1:14 PM 07/21/2019     1:26 AM  Advanced Directives  Does Patient Have a Medical Advance Directive? No Yes Yes No No No   Type of Special educational needs teacher of [place];Living will Healthcare Power of Attorney      Would patient like information on creating a medical advance directive? No - Patient declined   No - Patient declined  No - Patient declined No - Patient declined    Current Medications (verified) Outpatient Encounter Medications as of 04/01/2023  Medication Sig   acetaminophen (TYLENOL) 500 MG tablet Take 1,000 mg by mouth every 8 (eight) hours as needed for moderate pain (pain score 4-6).   albuterol (VENTOLIN HFA) 108 (90 Base) MCG/ACT inhaler Inhale 2 puffs into the lungs every 4 (four) hours as needed for wheezing or shortness of breath.   buPROPion (WELLBUTRIN XL) 150 MG 24 hr tablet Take 1 tablet (150 mg total) by mouth daily. For smoking cessation   Fluticasone-Umeclidin-Vilant (TRELEGY ELLIPTA) 100-62.5-25 MCG/ACT AEPB Inhale 1 puff into the lungs daily.   Multiple Vitamin (MULTIVITAMIN WITH MINERALS) TABS tablet Take 1 tablet by mouth daily.   nicotine (NICODERM CQ - DOSED IN MG/24 HOURS) 14 mg/24hr patch Place 1 patch (14 mg total) onto the skin daily. Apply 14 mg patch daily x 6 wk, then 7 mg patch daily x 2 wk   nicotine (NICODERM CQ - DOSED IN MG/24 HR) 7 mg/24hr patch Place 1 patch (7 mg total) onto the skin daily. Apply 14 mg patch daily x 6 wk, then 7 mg patch daily x  2 wk   tamsulosin (FLOMAX) 0.4 MG CAPS capsule Take 1 capsule (0.4 mg total) by mouth daily.   thiamine 100 MG tablet Take 1 tablet (100 mg total) by mouth daily.   traZODone (DESYREL) 50 MG tablet Take 1 tablet (50 mg total) by mouth at bedtime as needed for sleep.   melatonin 3 MG TABS tablet Take 1 tablet (3 mg total) by mouth at bedtime. (Patient not taking: Reported on 03/06/2023)   No facility-administered encounter medications on file as of 04/01/2023.    Allergies (verified) Patient has no known  allergies.   History: Past Medical History:  Diagnosis Date   Arthritis    Bacteremia    2016 or so with hospitalization   Emphysema of lung (HCC) 07/20/2019   History of radiation therapy    Right Lung, 01/29/2023 - 02/03/2023 Dr. Roselind Messier   Inguinal hernia recurrent unilateral    herniation of mesentary and loops of bowel to right scrotal sac, 4.5 cm defect   Lesion of right lung 07/20/2019   Lumbar radiculopathy 02/15/2021   Pulmonary emboli (HCC) 07/20/2019   Past Surgical History:  Procedure Laterality Date   BRONCHIAL BIOPSY  12/16/2022   Procedure: BRONCHIAL BIOPSIES;  Surgeon: [name], MD;  Location: [facility] ENDOSCOPY;  Service: Pulmonary;;   BRONCHIAL BRUSHINGS  12/16/2022   Procedure: BRONCHIAL BRUSHINGS;  Surgeon: [name], MD;  Location: [facility] ENDOSCOPY;  Service: Pulmonary;;   BRONCHIAL NEEDLE ASPIRATION BIOPSY  12/16/2022   Procedure: BRONCHIAL NEEDLE ASPIRATION BIOPSIES;  Surgeon: [name], MD;  Location: [facility] ENDOSCOPY;  Service: Pulmonary;;   FIDUCIAL MARKER PLACEMENT  12/16/2022   Procedure: FIDUCIAL MARKER PLACEMENT;  Surgeon: [name], MD;  Location: [facility] ENDOSCOPY;  Service: Pulmonary;;   NO PAST SURGERIES     Family History  Problem Relation Age of Onset   Cancer Mother    Heart disease Father    Social History   Socioeconomic History   Marital status: Single    Spouse name: Not on file   Number of children: Not on file   Years of education: Not on file   Highest education level: Not on file  Occupational History   Not on file  Tobacco Use   Smoking status: Some Days    Current packs/day: 1.00    Types: Cigarettes   Smokeless tobacco: Never   Tobacco comments:    4 cigarettes smoked daily PAP 02/20/2022  Vaping Use   Vaping status: Never Used  Substance and Sexual Activity   Alcohol use: Yes   Drug use: Yes    Frequency: 3.0 times per week    Types: Marijuana   Sexual activity: Not Currently  Other Topics Concern   Not on file   Social History Narrative   Lives with sister and grown nephew   Social Determinants of Health   Financial Resource Strain: Low Risk  (04/01/2023)   Overall Financial Resource Strain (CARDIA)    Difficulty of Paying Living Expenses: Not hard at all  Food Insecurity: No Food Insecurity (04/01/2023)   Hunger Vital Sign    Worried About Running Out of Food in the Last Year: Never true    Ran Out of Food in the Last Year: Never true  Transportation Needs: No Transportation Needs (04/01/2023)   PRAPARE - Administrator, Civil Service (Medical): No    Lack of Transportation (Non-Medical): No  Physical Activity: Inactive (04/01/2023)   Exercise Vital Sign    Days of  Exercise per Week: 0 days    Minutes of Exercise per Session: 0 min  Stress: No Stress Concern Present (04/01/2023)   [employer] of Occupational Health - Occupational Stress Questionnaire    Feeling of Stress : Not at all  Social Connections: Socially Isolated (04/01/2023)   Social Connection and Isolation Panel [NHANES]    Frequency of Communication with Friends and Family: More than three times a week    Frequency of Social Gatherings with Friends and Family: Once a week    Attends Religious Services: Never    Database administrator or Organizations: No    Attends Engineer, structural: Never    Marital Status: Never married    Tobacco Counseling Ready to quit: Yes Counseling given: Yes Tobacco comments: 4 cigarettes smoked daily PAP 02/20/2022   Clinical Intake:  Pre-visit preparation completed: Yes  Pain : No/denies pain     BMI - recorded: 18.31 Nutritional Status: BMI <19  Underweight Nutritional Risks: None Diabetes: No  How often do you need to have someone help you when you read instructions, pamphlets, or other written materials from your doctor or pharmacy?: 1 - Never  Interpreter Needed?: No  Information entered by :: Abby Vangie Henthorn, CMA   Activities of Daily  Living    04/01/2023    2:54 PM  In your present state of health, do you have any difficulty performing the following activities:  Hearing? 0  Vision? 1  Comment referral placed today  Difficulty concentrating or making decisions? 0  Walking or climbing stairs? 1  Comment uses a cane/walker and has copd so he gets short winded  Dressing or bathing? 0  Doing errands, shopping? 0  Preparing Food and eating ? N  Using the Toilet? N  In the past six months, have you accidently leaked urine? N  Do you have problems with loss of bowel control? N  Managing your Medications? N  Managing your Finances? N  Housekeeping or managing your Housekeeping? N    Patient Care Team: [name], MD as PCP - General (Family Medicine)  Indicate any recent Medical Services you may have received from other than [facility] providers in the past year (date may be approximate).     Assessment:   This is a routine wellness examination for [place].  Hearing/Vision screen Hearing Screening - Comments:: Patient denies any hearing difficulties.   Vision Screening - Comments:: Does not have an eye doctor. Referral placed for patient today    Goals Addressed             This Visit's Progress    Patient Stated       Remain active and independent        Depression Screen    04/01/2023    2:58 PM 02/07/2023    1:48 PM 02/15/2021   11:03 AM 11/29/2020   10:31 AM 09/22/2019   10:18 AM 08/25/2019   11:05 AM 12/22/2017   10:34 AM  PHQ 2/9 Scores  PHQ - 2 Score 0 0 2 0 0 0 6  PHQ- 9 Score 0 0 9 9  3 8     Fall Risk    04/01/2023    2:57 PM 02/07/2023    1:48 PM 12/05/2021    3:06 PM 02/15/2021   10:51 AM 11/29/2020   10:31 AM  Fall Risk   Falls in the past year? 0 0 0 0 0  Number falls in past yr: 0 0 0 0 0  Injury with Fall? 0 0 0 0 0  Risk for fall due to : Impaired balance/gait No Fall Risks     Follow up Falls prevention discussed;Education provided Falls evaluation completed        MEDICARE RISK AT HOME: Medicare Risk at Home Any stairs in or around the home?: Yes If so, are there any without handrails?: No Home free of loose throw rugs in walkways, pet beds, electrical cords, etc?: Yes Adequate lighting in your home to reduce risk of falls?: Yes Life alert?: No Use of a cane, walker or w/c?: Yes Grab bars in the bathroom?: No Shower chair or bench in shower?: Yes Elevated toilet seat or a handicapped toilet?: No  TIMED UP AND GO:  Was the test performed?  No    Cognitive Function:        04/01/2023    2:58 PM  6CIT Screen  What Year? 0 points  What month? 0 points  What time? 0 points  Count back from 20 0 points  Months in reverse 0 points  Repeat phrase 0 points  Total Score 0 points    Immunizations Immunization History  Administered Date(s) Administered   Fluad Trivalent(High Dose 65+) 02/07/2023   Influenza Whole 02/22/2018   Influenza,inj,Quad PF,6+ Mos 02/15/2021, 02/20/2022   PNEUMOCOCCAL CONJUGATE-20 02/07/2023    TDAP status: Due, Education has been provided regarding the importance of this vaccine. Advised may receive this vaccine at local pharmacy or Health Dept. Aware to provide a copy of the vaccination record if obtained from local pharmacy or Health Dept. Verbalized acceptance and understanding.  Flu Vaccine status: Due, Education has been provided regarding the importance of this vaccine. Advised may receive this vaccine at local pharmacy or Health Dept. Aware to provide a copy of the vaccination record if obtained from local pharmacy or Health Dept. Verbalized acceptance and understanding.  Pneumococcal vaccine status: Up to date  Covid-19 vaccine status: Information provided on how to obtain vaccines.   Qualifies for Shingles Vaccine? Yes   Zostavax completed No   Shingrix Completed?: No.    Education has been provided regarding the importance of this vaccine. Patient has been advised to call insurance company to  determine out of pocket expense if they have not yet received this vaccine. Advised may also receive vaccine at local pharmacy or Health Dept. Verbalized acceptance and understanding.  Screening Tests Health Maintenance  Topic Date Due   Medicare Annual Wellness (AWV)  Never done   COVID-19 Vaccine (1) Never done   DTaP/Tdap/Td (1 - Tdap) Never done   Zoster Vaccines- Shingrix (1 of 2) Never done   Colonoscopy  Never done   Pneumonia Vaccine 49+ Years old  Completed   INFLUENZA VACCINE  Completed   Hepatitis C Screening  Completed   HPV VACCINES  Aged Out    Health Maintenance  Health Maintenance Due  Topic Date Due   Medicare Annual Wellness (AWV)  Never done   COVID-19 Vaccine (1) Never done   DTaP/Tdap/Td (1 - Tdap) Never done   Zoster Vaccines- Shingrix (1 of 2) Never done   Colonoscopy  Never done    Colorectal cancer screening: Referral to GI placed 04/01/2023. Pt aware the office will call re: appt.  Lung Cancer Screening: (Low Dose CT Chest recommended if Age 47-80 years, 20 pack-year currently smoking OR have quit w/in 15years.) does qualify.   Lung Cancer Screening Referral: 04/01/2023  Additional Screening:  Hepatitis C Screening: does not qualify; Completed  Vision Screening: Recommended annual ophthalmology exams for early detection of glaucoma and other disorders of the eye. Is the patient up to date with their annual eye exam?  No  Who is the provider or what is the name of the office in which the patient attends annual eye exams? N/a If pt is not established with a provider, would they like to be referred to a provider to establish care? Yes .   Dental Screening: Recommended annual dental exams for proper oral hygiene  Diabetic Foot Exam: na  Community Resource Referral / Chronic Care Management: CRR required this visit?  No   CCM required this visit?  No     Plan:     I have personally reviewed and noted the following in the patient's chart:    Medical and social history Use of alcohol, tobacco or illicit drugs  Current medications and supplements including opioid prescriptions. Patient is not currently taking opioid prescriptions. Functional ability and status Nutritional status Physical activity Advanced directives List of other physicians Hospitalizations, surgeries, and ER visits in previous 12 months Vitals Screenings to include cognitive, depression, and falls Referrals and appointments  In addition, I have reviewed and discussed with patient certain preventive protocols, quality metrics, and best practice recommendations. A written personalized care plan for preventive services as well as general preventive health recommendations were provided to patient.     Jordan Hawks Ladaisha Portillo, CMA   04/01/2023   After Visit Summary: (Mail) Due to this being a telephonic visit, the after visit summary with patients personalized plan was offered to patient via mail   Nurse Notes: see routing note

## 2023-04-01 NOTE — Patient Instructions (Signed)
Marcus Pace , Thank you for taking time to come for your Medicare Wellness Visit. I appreciate your ongoing commitment to your health goals. Please review the following plan we discussed and let me know if I can assist you in the future.   Referrals/Orders/Follow-Ups/Clinician Recommendations:  Next Medicare Annual Wellness Visit: 04/06/2024 at 10:40 am virtual visit  You've been referred to see Dr. Sinda Du for an eye exam. If you haven't heard from his office in about a week, please call to schedule your appointment.   Sinda Du, MD [address] [place] [address]  Phone: [phone]  [facility] at [facility] [address] [place],  [place]  [address] Main: [phone]   You have been referred to have a low dose lung cancer screening test. If you haven't heard from them in a week please call one of the numbers listed below.   Kandice Robinsons [address] [place] [address] Sidney Ace Phone: [phone] Ginette Otto Phone: [phone]   This is a list of the screening recommended for you and due dates:  Health Maintenance  Topic Date Due   COVID-19 Vaccine (1) Never done   DTaP/Tdap/Td vaccine (1 - Tdap) Never done   Zoster (Shingles) Vaccine (1 of 2) Never done   Colon Cancer Screening  Never done   Medicare Annual Wellness Visit  03/31/2024   Pneumonia Vaccine  Completed   Flu Shot  Completed   Hepatitis C Screening  Completed   HPV Vaccine  Aged Out    Advanced directives: (ACP Link)Information on Advanced Care Planning can be found at [facility] [place] Advance Health Care Directives Advance Health Care Directives ([email or web address]   Next Medicare Annual Wellness Visit scheduled for next year: Yes  Preventive Care 65 Years and Older, Male Preventive care refers to lifestyle choices and visits with your health care provider that can promote health and wellness. Preventive care visits are also called wellness  exams. What can I expect for my preventive care visit? Counseling During your preventive care visit, your health care provider may ask about your: Medical history, including: Past medical problems. Family medical history. History of falls. Current health, including: Emotional well-being. Home life and relationship well-being. Sexual activity. Memory and ability to understand (cognition). Lifestyle, including: Alcohol, nicotine or tobacco, and drug use. Access to firearms. Diet, exercise, and sleep habits. Work and work Astronomer. Sunscreen use. Safety issues such as seatbelt and bike helmet use. Physical exam Your health care provider will check your: Height and weight. These may be used to calculate your BMI (body mass index). BMI is a measurement that tells if you are at a healthy weight. Waist circumference. This measures the distance around your waistline. This measurement also tells if you are at a healthy weight and may help predict your risk of certain diseases, such as type 2 diabetes and high blood pressure. Heart rate and blood pressure. Body temperature. Skin for abnormal spots. What immunizations do I need?  Vaccines are usually given at various ages, according to a schedule. Your health care provider will recommend vaccines for you based on your age, medical history, and lifestyle or other factors, such as travel or where you work. What tests do I need? Screening Your health care provider may recommend screening tests for certain conditions. This may include: Lipid and cholesterol levels. Diabetes screening. This is done by checking your blood sugar (glucose) after you have not eaten for a while (fasting).  Hepatitis C test. Hepatitis B test. HIV (human immunodeficiency virus) test. STI (sexually transmitted infection) testing, if you are at risk. Lung cancer screening. Colorectal cancer screening. Prostate cancer screening. Abdominal aortic aneurysm (AAA)  screening. You may need this if you are a current or former smoker. Talk with your health care provider about your test results, treatment options, and if necessary, the need for more tests. Follow these instructions at home: Eating and drinking  Eat a diet that includes fresh fruits and vegetables, whole grains, lean protein, and low-fat dairy products. Limit your intake of foods with high amounts of sugar, saturated fats, and salt. Take vitamin and mineral supplements as recommended by your health care provider. Do not drink alcohol if your health care provider tells you not to drink. If you drink alcohol: Limit how much you have to 0-2 drinks a day. Know how much alcohol is in your drink. In the [place] one drink equals one 12 oz bottle of beer (355 mL), one 5 oz glass of wine (148 mL), or one 1 oz glass of hard liquor (44 mL). Lifestyle Brush your teeth every morning and night with fluoride toothpaste. Floss one time each day. Exercise for at least 30 minutes 5 or more days each week. Do not use any products that contain nicotine or tobacco. These products include cigarettes, chewing tobacco, and vaping devices, such as e-cigarettes. If you need help quitting, ask your health care provider. Do not use drugs. If you are sexually active, practice safe sex. Use a condom or other form of protection to prevent STIs. Take aspirin only as told by your health care provider. Make sure that you understand how much to take and what form to take. Work with your health care provider to find out whether it is safe and beneficial for you to take aspirin daily. Ask your health care provider if you need to take a cholesterol-lowering medicine (statin). Find healthy ways to manage stress, such as: Meditation, yoga, or listening to music. Journaling. Talking to a trusted person. Spending time with friends and family. Safety Always wear your seat belt while driving or riding in a vehicle. Do not drive: If  you have been drinking alcohol. Do not ride with someone who has been drinking. When you are tired or distracted. While texting. If you have been using any mind-altering substances or drugs. Wear a helmet and other protective equipment during sports activities. If you have firearms in your house, make sure you follow all gun safety procedures. Minimize exposure to UV radiation to reduce your risk of skin cancer. What's next? Visit your health care provider once a year for an annual wellness visit. Ask your health care provider how often you should have your eyes and teeth checked. Stay up to date on all vaccines. This information is not intended to replace advice given to you by your health care provider. Make sure you discuss any questions you have with your health care provider. Document Revised: 10/18/2020 Document Reviewed: 10/18/2020 Elsevier Patient Education  2024 [employer]. Understanding Your Risk for Falls Millions of people have serious injuries from falls each year. It is important to understand your risk of falling. Talk with your health care provider about your risk and what you can do to lower it. If you do have a serious fall, make sure to tell your provider. Falling once raises your risk of falling again. How can falls affect me? Serious injuries from falls are common. These include: Broken bones, such  as hip fractures. Head injuries, such as traumatic brain injuries (TBI) or concussions. A fear of falling can cause you to avoid activities and stay at home. This can make your muscles weaker and raise your risk for a fall. What can increase my risk? There are a number of risk factors that increase your risk for falling. The more risk factors you have, the higher your risk of falling. Serious injuries from a fall happen most often to people who are older than 66 years old. Teenagers and young adults ages 37-29 are also at higher risk. Common risk factors include: Weakness  in the lower body. Being generally weak or confused due to long-term (chronic) illness. Dizziness or balance problems. Poor vision. Medicines that cause dizziness or drowsiness. These may include: Medicines for your blood pressure, heart, anxiety, insomnia, or swelling (edema). Pain medicines. Muscle relaxants. Other risk factors include: Drinking alcohol. Having had a fall in the past. Having foot pain or wearing improper footwear. Working at a dangerous job. Having any of the following in your home: Tripping hazards, such as floor clutter or loose rugs. Poor lighting. Pets. Having dementia or memory loss. What actions can I take to lower my risk of falling?     Physical activity Stay physically fit. Do strength and balance exercises. Consider taking a regular class to build strength and balance. Yoga and tai chi are good options. Vision Have your eyes checked every year and your prescription for glasses or contacts updated as needed. Shoes and walking aids Wear non-skid shoes. Wear shoes that have rubber soles and low heels. Do not wear high heels. Do not walk around the house in socks or slippers. Use a cane or walker as told by your provider. Home safety Attach secure railings on both sides of your stairs. Install grab bars for your bathtub, shower, and toilet. Use a non-skid mat in your bathtub or shower. Attach bath mats securely with double-sided, non-slip rug tape. Use good lighting in all rooms. Keep a flashlight near your bed. Make sure there is a clear path from your bed to the bathroom. Use night-lights. Do not use throw rugs. Make sure all carpeting is taped or tacked down securely. Remove all clutter from walkways and stairways, including extension cords. Repair uneven or broken steps and floors. Avoid walking on icy or slippery surfaces. Walk on the grass instead of on icy or slick sidewalks. Use ice melter to get rid of ice on walkways in the winter. Use a  cordless phone. Questions to ask your health care provider Can you help me check my risk for a fall? Do any of my medicines make me more likely to fall? Should I take a vitamin D supplement? What exercises can I do to improve my strength and balance? Should I make an appointment to have my vision checked? Do I need a bone density test to check for weak bones (osteoporosis)? Would it help to use a cane or a walker? Where to find more information Centers for Disease Control and Prevention, STEADI: TonerPromos.no Community-Based Fall Prevention Programs: TonerPromos.no [employer] on Aging: BaseRingTones.pl Contact a health care provider if: You fall at home. You are afraid of falling at home. You feel weak, drowsy, or dizzy. This information is not intended to replace advice given to you by your health care provider. Make sure you discuss any questions you have with your health care provider. Document Revised: 12/24/2021 Document Reviewed: 12/24/2021 Elsevier Patient Education  2024 Elsevier Inc. Lung Cancer  Screening A lung cancer screening is a test that checks for lung cancer when there are no symptoms or history of that disease. The screening is done to look for lung cancer in its very early stages. Finding cancer early improves the chances of successful treatment. It may save your life. Who should have a screening? You should be screened for lung cancer if all of these apply: You currently smoke or you used to smoke. You are between the ages of 48 and 62 years old. Screening may be recommended up to age 5 depending on your overall health and other factors. You have a smoking history of 1 pack of cigarettes a day for 20 years or 2 packs a day for 10 years. How is screening done?  The recommended screening test is a low-dose computed tomography (LDCT) scan. This scan takes detailed images of the lungs. This allows a health care provider to look for abnormal cells. If you are at risk for lung  cancer, it is recommended that you get screened once a year. Talk to your health care provider about the risks, benefits, and limitations of screening. What are the benefits of screening? Screening can find lung cancer early, before symptoms start and before it has spread outside of the lungs. The chances of curing lung cancer are greater if the cancer is diagnosed early. What are the risks of screening? The screening may show lung cancer when no cancer is present. Talk with your health care provider about what your results mean. In some cases, your health care provider may do more testing to confirm the results. The screening may not find lung cancer when it is present. You will be exposed to radiation from repeated LDCT tests, which can cause cancer in otherwise healthy people. How can I lower my risk of lung cancer? Make these lifestyle changes to lower your risk of developing lung cancer: Do not use any products that contain nicotine or tobacco. These products include cigarettes, chewing tobacco, and vaping devices, such as e-cigarettes. If you need help quitting, ask your health care provider. Avoid secondhand smoke. Avoid exposure to radiation. Avoid exposure to radon gas. Have your home checked for radon regularly. Avoid things that cause cancer (carcinogens). Avoid living or working in places with high air pollution or diesel exhaust. Questions to ask your health care provider Am I eligible for lung cancer screening? Does my health insurance cover the cost of lung cancer screening? What happens if the lung cancer screening shows something of concern? How soon will I have results from my lung cancer screening? Is there anything that I need to do to prepare for my lung cancer screening? What happens if I decide not to have lung cancer screening? Where to find more information Ask your health care provider about the risks and benefits of screening. More information and resources are  available from these organizations: American Cancer Society (ACS): cancer.org American Lung Association: lung.org National Cancer Institute: cancer.gov Contact a health care provider if: You start to show symptoms of lung cancer, including: A cough that will not go away. High-pitched whistling sounds when you breathe, most often when you breathe out (wheezing). Chest pain. Coughing up blood. Shortness of breath. Weight loss that cannot be explained. Constant tiredness (fatigue). Hoarse voice. Summary Lung cancer screening may find lung cancer before symptoms appear. Finding cancer early improves the chances of successful treatment. It may save your life. The recommended screening test is a low-dose computed tomography (LDCT) scan that looks  for abnormal cells in the lungs. If you are at risk for lung cancer, it is recommended that you get screened once a year. You can make lifestyle changes to lower your risk of lung cancer. Ask your health care provider about the risks and benefits of screening. This information is not intended to replace advice given to you by your health care provider. Make sure you discuss any questions you have with your health care provider. Document Revised: 04/30/2022 Document Reviewed: 10/11/2020 Elsevier Patient Education  2024 [employer].

## 2023-05-08 ENCOUNTER — Inpatient Hospital Stay: Payer: Medicare HMO | Attending: Internal Medicine

## 2023-05-08 ENCOUNTER — Ambulatory Visit (HOSPITAL_COMMUNITY)
Admission: RE | Admit: 2023-05-08 | Discharge: 2023-05-08 | Disposition: A | Discharge status: Home / Self Care | Payer: Medicare HMO | Source: Ambulatory Visit | Attending: Physician Assistant | Admitting: Physician Assistant

## 2023-05-08 DIAGNOSIS — J449 Chronic obstructive pulmonary disease, unspecified: Secondary | ICD-10-CM | POA: Insufficient documentation

## 2023-05-08 DIAGNOSIS — C3431 Malignant neoplasm of lower lobe, right bronchus or lung: Secondary | ICD-10-CM | POA: Insufficient documentation

## 2023-05-08 DIAGNOSIS — Z79899 Other long term (current) drug therapy: Secondary | ICD-10-CM | POA: Diagnosis not present

## 2023-05-08 DIAGNOSIS — F17210 Nicotine dependence, cigarettes, uncomplicated: Secondary | ICD-10-CM | POA: Insufficient documentation

## 2023-05-08 DIAGNOSIS — C3490 Malignant neoplasm of unspecified part of unspecified bronchus or lung: Secondary | ICD-10-CM | POA: Diagnosis not present

## 2023-05-08 DIAGNOSIS — J432 Centrilobular emphysema: Secondary | ICD-10-CM | POA: Diagnosis not present

## 2023-05-08 DIAGNOSIS — J90 Pleural effusion, not elsewhere classified: Secondary | ICD-10-CM | POA: Diagnosis not present

## 2023-05-08 LAB — CBC WITH DIFFERENTIAL (CANCER CENTER ONLY)
Abs Immature Granulocytes: 0.02 10*3/uL (ref 0.00–0.07)
Basophils Absolute: 0 10*3/uL (ref 0.0–0.1)
Basophils Relative: 0 %
Eosinophils Absolute: 0.1 10*3/uL (ref 0.0–0.5)
Eosinophils Relative: 2 %
HCT: 50.5 % (ref 39.0–52.0)
Hemoglobin: 17.2 g/dL — ABNORMAL HIGH (ref 13.0–17.0)
Immature Granulocytes: 0 %
Lymphocytes Relative: 13 %
Lymphs Abs: 0.9 10*3/uL (ref 0.7–4.0)
MCH: 29.6 pg (ref 26.0–34.0)
MCHC: 34.1 g/dL (ref 30.0–36.0)
MCV: 86.9 fL (ref 80.0–100.0)
Monocytes Absolute: 0.8 10*3/uL (ref 0.1–1.0)
Monocytes Relative: 11 %
Neutro Abs: 5 10*3/uL (ref 1.7–7.7)
Neutrophils Relative %: 74 %
Platelet Count: 260 10*3/uL (ref 150–400)
RBC: 5.81 MIL/uL (ref 4.22–5.81)
RDW: 13.2 % (ref 11.5–15.5)
WBC Count: 6.8 10*3/uL (ref 4.0–10.5)
nRBC: 0 % (ref 0.0–0.2)

## 2023-05-08 LAB — CMP (CANCER CENTER ONLY)
ALT: 27 U/L (ref 0–44)
AST: 30 U/L (ref 15–41)
Albumin: 4.1 g/dL (ref 3.5–5.0)
Alkaline Phosphatase: 85 U/L (ref 38–126)
Anion gap: 5 (ref 5–15)
BUN: 10 mg/dL (ref 8–23)
CO2: 31 mmol/L (ref 22–32)
Calcium: 9.7 mg/dL (ref 8.9–10.3)
Chloride: 105 mmol/L (ref 98–111)
Creatinine: 0.93 mg/dL (ref 0.61–1.24)
GFR, Estimated: >60 mL/min (ref >60)
Glucose, Bld: 56 mg/dL — ABNORMAL LOW (ref 70–99)
Potassium: 4.1 mmol/L (ref 3.5–5.1)
Sodium: 141 mmol/L (ref 135–145)
Total Bilirubin: 1.1 mg/dL (ref 0.0–1.2)
Total Protein: 7.5 g/dL (ref 6.5–8.1)

## 2023-05-08 MED ORDER — IOHEXOL 300 MG/ML  SOLN
75.0000 mL | Freq: Once | INTRAMUSCULAR | Status: AC | PRN
  Administered 2023-05-08: 75 mL via INTRAVENOUS

## 2023-05-15 ENCOUNTER — Inpatient Hospital Stay: Payer: Medicare HMO | Admitting: Internal Medicine

## 2023-05-15 ENCOUNTER — Other Ambulatory Visit: Payer: Self-pay

## 2023-05-15 VITALS — BP 126/89 | HR 88 | Temp 97.9°F | Resp 17 | Ht 72.0 in | Wt 137.1 lb

## 2023-05-15 DIAGNOSIS — C3490 Malignant neoplasm of unspecified part of unspecified bronchus or lung: Secondary | ICD-10-CM | POA: Diagnosis not present

## 2023-05-15 DIAGNOSIS — F17210 Nicotine dependence, cigarettes, uncomplicated: Secondary | ICD-10-CM | POA: Diagnosis not present

## 2023-05-15 DIAGNOSIS — Z79899 Other long term (current) drug therapy: Secondary | ICD-10-CM | POA: Diagnosis not present

## 2023-05-15 DIAGNOSIS — J449 Chronic obstructive pulmonary disease, unspecified: Secondary | ICD-10-CM | POA: Diagnosis not present

## 2023-05-15 DIAGNOSIS — C3431 Malignant neoplasm of lower lobe, right bronchus or lung: Secondary | ICD-10-CM | POA: Diagnosis not present

## 2023-05-15 NOTE — Progress Notes (Signed)
 [facility] Telephone:([phone]) [phone]   Fax:([phone]) [phone]  OFFICE PROGRESS NOTE  Marcus Clam, MD [address] [place] [address]  DIAGNOSIS: Stage IIB (T3, N0, M0) non-small cell lung cancer, adenocarcinoma. He presented with 2 pulmonary nodules in the right lower lobe.    Request PDL1 and [facility] One today    PRIOR THERAPY: None   CURRENT THERAPY: SBRT to the 2 pulmonary nodules in the right lung under the care of Dr. Shannon.  He is scheduled to start on 01/20/2023.  INTERVAL HISTORY: Marcus Pace 68 y.o. male returns to the clinic today for follow-up visit accompanied by his mother.Discussed the use of AI scribe software for clinical note transcription with the patient, who gave verbal consent to proceed.  History of Present Illness   The patient, a 68 year old individual with a history of adenocarcinoma diagnosed in August 2024, underwent stereotactic body radiation therapy (SBRT) for three nodules in the right lung. Since the treatment, he has been experiencing symptoms of COPD, including shortness of breath and a persistent cough. The cough is productive, but there is no hemoptysis. He denies any chest pain, weight loss, nausea, or vomiting.  The patient's recent chest scan showed a reduction in tumor size, indicating a positive response to the SBRT.       MEDICAL HISTORY: Past Medical History:  Diagnosis Date   Arthritis    Bacteremia    2016 or so with hospitalization   Emphysema of lung (HCC) 07/20/2019   History of radiation therapy    Right Lung, 01/29/2023 - 02/03/2023 Dr. Shannon   Inguinal hernia recurrent unilateral    herniation of mesentary and loops of bowel to right scrotal sac, 4.5 cm defect   Lesion of right lung 07/20/2019   Lumbar radiculopathy 02/15/2021   Pulmonary emboli (HCC) 07/20/2019    ALLERGIES:  has no known allergies.  MEDICATIONS:  Current Outpatient Medications  Medication Sig Dispense  Refill   acetaminophen  (TYLENOL ) 500 MG tablet Take 1,000 mg by mouth every 8 (eight) hours as needed for moderate pain (pain score 4-6).     albuterol  (VENTOLIN  HFA) 108 (90 Base) MCG/ACT inhaler Inhale 2 puffs into the lungs every 4 (four) hours as needed for wheezing or shortness of breath. 8.5 g 6   buPROPion  (WELLBUTRIN  XL) 150 MG 24 hr tablet Take 1 tablet (150 mg total) by mouth daily. For smoking cessation 30 tablet 6   Fluticasone -Umeclidin-Vilant (TRELEGY ELLIPTA ) 100-62.5-25 MCG/ACT AEPB Inhale 1 puff into the lungs daily. 60 each 6   melatonin 3 MG TABS tablet Take 1 tablet (3 mg total) by mouth at bedtime. (Patient not taking: Reported on 03/06/2023) 30 tablet 3   Multiple Vitamin (MULTIVITAMIN WITH MINERALS) TABS tablet Take 1 tablet by mouth daily. 30 tablet 0   nicotine  (NICODERM CQ  - DOSED IN MG/24 HOURS) 14 mg/24hr patch Place 1 patch (14 mg total) onto the skin daily. Apply 14 mg patch daily x 6 wk, then 7 mg patch daily x 2 wk 14 patch 0   nicotine  (NICODERM CQ  - DOSED IN MG/24 HR) 7 mg/24hr patch Place 1 patch (7 mg total) onto the skin daily. Apply 14 mg patch daily x 6 wk, then 7 mg patch daily x 2 wk 14 patch 0   tamsulosin  (FLOMAX ) 0.4 MG CAPS capsule Take 1 capsule (0.4 mg total) by mouth daily. 30 capsule 6   thiamine  100 MG tablet Take 1 tablet (100 mg  total) by mouth daily. 30 tablet 0   traZODone  (DESYREL ) 50 MG tablet Take 1 tablet (50 mg total) by mouth at bedtime as needed for sleep. 30 tablet 6   No current facility-administered medications for this visit.    SURGICAL HISTORY:  Past Surgical History:  Procedure Laterality Date   BRONCHIAL BIOPSY  12/16/2022   Procedure: BRONCHIAL BIOPSIES;  Surgeon: Shelah Lamar RAMAN, MD;  Location: [facility] ENDOSCOPY;  Service: Pulmonary;;   BRONCHIAL BRUSHINGS  12/16/2022   Procedure: BRONCHIAL BRUSHINGS;  Surgeon: Shelah Lamar RAMAN, MD;  Location: [facility] ENDOSCOPY;  Service: Pulmonary;;   BRONCHIAL NEEDLE ASPIRATION BIOPSY  12/16/2022    Procedure: BRONCHIAL NEEDLE ASPIRATION BIOPSIES;  Surgeon: Shelah Lamar RAMAN, MD;  Location: [facility] ENDOSCOPY;  Service: Pulmonary;;   FIDUCIAL MARKER PLACEMENT  12/16/2022   Procedure: FIDUCIAL MARKER PLACEMENT;  Surgeon: Shelah Lamar RAMAN, MD;  Location: [facility] ENDOSCOPY;  Service: Pulmonary;;   NO PAST SURGERIES      REVIEW OF SYSTEMS:  A comprehensive review of systems was negative except for: Respiratory: positive for cough   PHYSICAL EXAMINATION: General appearance: alert, cooperative, and no distress Head: Normocephalic, without obvious abnormality, atraumatic Neck: no adenopathy, no JVD, supple, symmetrical, trachea midline, and thyroid not enlarged, symmetric, no tenderness/mass/nodules Lymph nodes: Cervical, supraclavicular, and axillary nodes normal. Resp: clear to auscultation bilaterally Back: symmetric, no curvature. ROM normal. No CVA tenderness. Cardio: regular rate and rhythm, S1, S2 normal, no murmur, click, rub or gallop GI: soft, non-tender; bowel sounds normal; no masses,  no organomegaly Extremities: extremities normal, atraumatic, no cyanosis or edema  ECOG PERFORMANCE STATUS: 1 - Symptomatic but completely ambulatory  Blood pressure 126/89, pulse 88, temperature 97.9 F (36.6 C), temperature source Temporal, resp. rate 17, height 6' (1.829 m), weight 137 lb 1.6 oz (62.2 kg), SpO2 97%.  LABORATORY DATA: Lab Results  Component Value Date   WBC 6.8 05/08/2023   HGB 17.2 (H) 05/08/2023   HCT 50.5 05/08/2023   MCV 86.9 05/08/2023   PLT 260 05/08/2023      Chemistry      Component Value Date/Time   NA 141 05/08/2023 0825   NA 142 12/05/2021 1610   K 4.1 05/08/2023 0825   CL 105 05/08/2023 0825   CO2 31 05/08/2023 0825   BUN 10 05/08/2023 0825   BUN 10 12/05/2021 1610   CREATININE 0.93 05/08/2023 0825      Component Value Date/Time   CALCIUM 9.7 05/08/2023 0825   ALKPHOS 85 05/08/2023 0825   AST 30 05/08/2023 0825   ALT 27 05/08/2023 0825   BILITOT 1.1 05/08/2023  0825       RADIOGRAPHIC STUDIES: CT Chest W Contrast Result Date: 05/14/2023 CLINICAL DATA:  Non-small cell lung cancer, assess treatment response. * Tracking Code: BO * EXAM: CT CHEST WITH CONTRAST TECHNIQUE: Multidetector CT imaging of the chest was performed during intravenous contrast administration. RADIATION DOSE REDUCTION: This exam was performed according to the departmental dose-optimization program which includes automated exposure control, adjustment of the mA and/or kV according to patient size and/or use of iterative reconstruction technique. CONTRAST:  75mL OMNIPAQUE  IOHEXOL  300 MG/ML  SOLN COMPARISON:  12/13/2022. FINDINGS: Cardiovascular: Coronary artery calcification. Heart size normal. No pericardial effusion. Mediastinum/Nodes: No pathologically enlarged mediastinal, hilar or axillary lymph nodes. Esophagus is grossly unremarkable. Lungs/Pleura: Centrilobular and paraseptal emphysema. Scattered vague peribronchovascular scarring. Medial right lower lobe nodule has decreased in size, now measuring 7 mm (6 x 8 mm, 6/97), previously 10 mm. More inferolaterally within the  right lower lobe is a spiculated nodule measuring 1.4 cm (1.3 x 1.5 cm, 6/122), stable when remeasured in similar fashion (previously measured as 2 separate nodules). Tiny fiducial markers are associated with both nodules. Endobronchial debris in both lower lobes. Pleuroparenchymal scarring in the lingula and left lower lobe. Trace loculated bilateral pleural fluid. Debris in the airway. Upper Abdomen: Visualized portions of the liver, gallbladder, adrenal glands, kidneys, spleen, pancreas, stomach and bowel are grossly unremarkable. No upper abdominal adenopathy. Musculoskeletal: Degenerative changes in the spine. No worrisome lytic or sclerotic lesions. IMPRESSION: 1. 7 mm medial right lower lobe nodule has decreased in size. 1.4 cm inferolateral right lower lobe nodule is stable. Findings are compatible with the provided  history of bronchogenic carcinoma. 2. Trace loculated pleural fluid bilaterally. 3. Coronary artery calcification. 4.  Emphysema (ICD10-J43.9). Electronically Signed   By: Newell Eke M.D.   On: 05/14/2023 16:52    ASSESSMENT AND PLAN: This is a very pleasant 68 years old African-American male with Stage IIB (T3, N0, M0) non-small cell lung cancer, adenocarcinoma. He presented with 2 pulmonary nodules in the right lower lobe status post SBRT to the right lung nodule.  He is currently on observation. He had repeat CT scan of the chest performed recently.  I personally independently reviewed the scan images and discussed the result with the patient and his mother today.    Adenocarcinoma of the Lung Diagnosed in August 2024. Underwent SBRT and three cycles of treatment. Recent chest scan shows tumor shrinking, indicating positive response. No new symptoms such as chest pain, weight loss, or hemoptysis. Discussed the importance of hydration and provided scan results. - Continue current treatment regimen - Encourage increased water intake - Schedule follow-up visit in six months - Order chest scan and lab work one week before the next visit  Chronic Obstructive Pulmonary Disease (COPD) Reports shortness of breath and cough, consistent with COPD. No chest pain or hemoptysis. Condition is well-managed. - Continue current COPD management plan.   The patient was advised to call immediately if he has any other concerning symptoms in the interval. The patient voices understanding of current disease status and treatment options and is in agreement with the current care plan.  All questions were answered. The patient knows to call the clinic with any problems, questions or concerns. We can certainly see the patient much sooner if necessary.  The total time spent in the appointment was 20 minutes.  Disclaimer: This note was dictated with voice recognition software. Similar sounding words can  inadvertently be transcribed and may not be corrected upon review.

## 2023-07-02 ENCOUNTER — Encounter: Payer: Self-pay | Admitting: Gastroenterology

## 2023-07-11 ENCOUNTER — Ambulatory Visit: Payer: Medicare HMO | Admitting: *Deleted

## 2023-07-11 VITALS — Ht 72.0 in | Wt 136.0 lb

## 2023-07-11 DIAGNOSIS — Z1211 Encounter for screening for malignant neoplasm of colon: Secondary | ICD-10-CM

## 2023-07-11 MED ORDER — SUFLAVE 178.7 G PO SOLR
1.0000 | Freq: Once | ORAL | 0 refills | Status: DC
Start: 2023-07-11 — End: 2023-07-11

## 2023-07-11 MED ORDER — SUFLAVE 178.7 G PO SOLR
1.0000 | Freq: Once | ORAL | 0 refills | Status: AC
Start: 2023-07-11 — End: 2023-07-11

## 2023-07-11 NOTE — Progress Notes (Addendum)
 Pt's name and DOB verified at the beginning of the pre-visit wit 2 identifiers  Permission given to speak with Sister  Pt uses cane to assist with walking but was observed steady with use.  Pt has no issues with ambulation   Pt has no issues moving head neck or swallowing  No egg or soy allergy known to patient   No issues known to pt with past sedation with any surgeries or procedures  Patient denies ever being intubated  No FH of Malignant Hyperthermia  Pt is not on diet pills or shots  Pt is not on home 02   Pt is not on blood thinners   Pt denies issues with constipation   Pt is not on dialysis  Pt denise any abnormal heart rhythms   Pt denies any upcoming cardiac testing  Patient's chart reviewed by Cathlyn Parsons CNRA prior to pre-visit and patient appropriate for the [facility].  Pre-visit completed and red dot placed by patient's name on their procedure day (on provider's schedule).    Chart not reviewed by CRNA prior to [facility]  Visit by phone  Pt Scale weight is 136 lb  IInstructions reviewed. Pt given [facility], [facility] main # and MD on call # prior to instructions.  Pt states understanding of instructions. Instructed to review again prior to procedure. Pt states they will.   Informed pt that they will receive a text or  call from [facility] regarding there prep med.

## 2023-08-06 ENCOUNTER — Encounter: Payer: Self-pay | Admitting: Gastroenterology

## 2023-08-11 ENCOUNTER — Ambulatory Visit (AMBULATORY_SURGERY_CENTER): Payer: Medicare HMO | Admitting: Gastroenterology

## 2023-08-11 ENCOUNTER — Encounter: Payer: Self-pay | Admitting: Gastroenterology

## 2023-08-11 VITALS — BP 120/81 | HR 91 | Temp 98.1°F | Resp 13 | Ht 72.0 in | Wt 136.0 lb

## 2023-08-11 DIAGNOSIS — Z1211 Encounter for screening for malignant neoplasm of colon: Secondary | ICD-10-CM

## 2023-08-11 DIAGNOSIS — D124 Benign neoplasm of descending colon: Secondary | ICD-10-CM

## 2023-08-11 DIAGNOSIS — J439 Emphysema, unspecified: Secondary | ICD-10-CM | POA: Diagnosis not present

## 2023-08-11 DIAGNOSIS — K529 Noninfective gastroenteritis and colitis, unspecified: Secondary | ICD-10-CM

## 2023-08-11 MED ORDER — SODIUM CHLORIDE 0.9 % IV SOLN: 500.0000 mL | Freq: Once | INTRAVENOUS | Status: DC

## 2023-08-11 NOTE — Patient Instructions (Signed)
 Resume previous diet and medications. Awaiting pathology results. Repeat Colonoscopy date to be determined based on pathology results. Handouts provided on colon polyps  YOU HAD AN ENDOSCOPIC PROCEDURE TODAY AT THE  ENDOSCOPY CENTER:   Refer to the procedure report that was given to you for any specific questions about what was found during the examination.  If the procedure report does not answer your questions, please call your gastroenterologist to clarify.  If you requested that your care partner not be given the details of your procedure findings, then the procedure report has been included in a sealed envelope for you to review at your convenience later.  YOU SHOULD EXPECT: Some feelings of bloating in the abdomen. Passage of more gas than usual.  Walking can help get rid of the air that was put into your GI tract during the procedure and reduce the bloating. If you had a lower endoscopy (such as a colonoscopy or flexible sigmoidoscopy) you may notice spotting of blood in your stool or on the toilet paper. If you underwent a bowel prep for your procedure, you may not have a normal bowel movement for a few days.  Please Note:  You might notice some irritation and congestion in your nose or some drainage.  This is from the oxygen used during your procedure.  There is no need for concern and it should clear up in a day or so.  SYMPTOMS TO REPORT IMMEDIATELY:  Following lower endoscopy (colonoscopy or flexible sigmoidoscopy):  Excessive amounts of blood in the stool  Significant tenderness or worsening of abdominal pains  Swelling of the abdomen that is new, acute  Fever of 100F or higher  For urgent or emergent issues, a gastroenterologist can be reached at any hour by calling [phone]. Do not use MyChart messaging for urgent concerns.    DIET:  We do recommend a small meal at first, but then you may proceed to your regular diet.  Drink plenty of fluids but you should avoid  alcoholic beverages for 24 hours.  ACTIVITY:  You should plan to take it easy for the rest of today and you should NOT DRIVE or use heavy machinery until tomorrow (because of the sedation medicines used during the test).    FOLLOW UP: Our staff will call the number listed on your records the next business day following your procedure.  We will call around 7:15- 8:00 am to check on you and address any questions or concerns that you may have regarding the information given to you following your procedure. If we do not reach you, we will leave a message.     If any biopsies were taken you will be contacted by phone or by letter within the next 1-3 weeks.  Please call us at [phone] if you have not heard about the biopsies in 3 weeks.    SIGNATURES/CONFIDENTIALITY: You and/or your care partner have signed paperwork which will be entered into your electronic medical record.  These signatures attest to the fact that that the information above on your After Visit Summary has been reviewed and is understood.  Full responsibility of the confidentiality of this discharge information lies with you and/or your care-partner.

## 2023-08-11 NOTE — Progress Notes (Signed)
 Called to room to assist during endoscopic procedure.  Patient ID and intended procedure confirmed with present staff. Received instructions for my participation in the procedure from the performing physician.

## 2023-08-11 NOTE — Progress Notes (Signed)
 To pacu, VSS. Report to [email or web address]

## 2023-08-11 NOTE — Op Note (Signed)
 [facility] Patient Name: Marcus Pace Procedure Date: 08/11/2023 1:00 PM MRN: [id number] Endoscopist: Lorin Picket E. Tomasa Rand , MD, [id number] Age: 68 Referring MD:  Date of Birth: 12-May-1955 Gender: Male Account #: [id number] Procedure:                Colonoscopy Indications:              Screening for colorectal malignant neoplasm, This                            is the patient's first colonoscopy Medicines:                Monitored Anesthesia Care Procedure:                Pre-Anesthesia Assessment:                           - Prior to the procedure, a History and Physical                            was performed, and patient medications and                            allergies were reviewed. The patient's tolerance of                            previous anesthesia was also reviewed. The risks                            and benefits of the procedure and the sedation                            options and risks were discussed with the patient.                            All questions were answered, and informed consent                            was obtained. Prior Anticoagulants: The patient has                            taken no anticoagulant or antiplatelet agents. ASA                            Grade Assessment: III - A patient with severe                            systemic disease. After reviewing the risks and                            benefits, the patient was deemed in satisfactory                            condition to undergo the procedure.  After obtaining informed consent, the colonoscope                            was passed under direct vision. Throughout the                            procedure, the patient's blood pressure, pulse, and                            oxygen saturations were monitored continuously. The                            CF [id number] #[id number] was introduced through the anus                            and advanced  to the the terminal ileum, with                            identification of the appendiceal orifice and IC                            valve. The colonoscopy was performed without                            difficulty. The patient tolerated the procedure                            well. The quality of the bowel preparation was                            good. The terminal ileum, ileocecal valve,                            appendiceal orifice, and rectum were photographed.                            The bowel preparation used was SUFLAVE via split                            dose instruction. Scope In: 1:33:44 PM Scope Out: 1:51:01 PM Scope Withdrawal Time: 0 hours 13 minutes 5 seconds  Total Procedure Duration: 0 hours 17 minutes 17 seconds  Findings:                 The perianal and digital rectal examinations were                            normal. Pertinent negatives include normal                            sphincter tone and no palpable rectal lesions.                           A 7 mm polyp was found in the descending colon. The  polyp was sessile. The polyp was removed with a                            cold snare. Resection and retrieval were complete.                            Estimated blood loss was minimal.                           A single erythematous erosion was found in the                            cecum. Biopsies were taken with a cold forceps for                            histology. Estimated blood loss was minimal.                           The exam was otherwise normal throughout the                            examined colon.                           The terminal ileum appeared normal.                           The retroflexed view of the distal rectum and anal                            verge was normal and showed no anal or rectal                            abnormalities. Complications:            No immediate complications. Estimated  Blood Loss:     Estimated blood loss was minimal. Impression:               - One 7 mm polyp in the descending colon, removed                            with a cold snare. Resected and retrieved.                           - A single erosion in the cecum. Biopsied.                           - The examined portion of the ileum was normal.                           - The distal rectum and anal verge are normal on                            retroflexion view. Recommendation:           -  Patient has a contact number available for                            emergencies. The signs and symptoms of potential                            delayed complications were discussed with the                            patient. Return to normal activities tomorrow.                            Written discharge instructions were provided to the                            patient.                           - Resume previous diet.                           - Continue present medications.                           - Await pathology results.                           - Repeat colonoscopy (date not yet determined) for                            surveillance based on pathology results. Tamiyah Moulin E. Tomasa Rand, MD 08/11/2023 2:03:21 PM This report has been signed electronically.

## 2023-08-11 NOTE — Progress Notes (Signed)
 Pt's states no medical or surgical changes since previsit or office visit.

## 2023-08-11 NOTE — Progress Notes (Signed)
 [facility] History and Physical   Primary Care Physician:  [name], MD   Reason for Procedure:   Colon cancer screening  Plan:    Screening colonoscopy     HPI: Marcus Pace is a 68 y.o. male undergoing initial average risk screening colonoscopy.  He has no family history of colon cancer and no chronic GI symptoms.  He has non small cell lung cancer and has completed SBRT with tumor response   Past Medical History:  Diagnosis Date   Arthritis    Bacteremia    2016 or so with hospitalization   Emphysema of lung (HCC) 07/20/2019   History of radiation therapy    Right Lung, 01/29/2023 - 02/03/2023 Dr. Roselind Messier   Inguinal hernia recurrent unilateral    herniation of mesentary and loops of bowel to right scrotal sac, 4.5 cm defect   Lesion of right lung 07/20/2019   Lumbar radiculopathy 02/15/2021   Pulmonary emboli (HCC) 07/20/2019    Past Surgical History:  Procedure Laterality Date   BRONCHIAL BIOPSY  12/16/2022   Procedure: BRONCHIAL BIOPSIES;  Surgeon: [name], MD;  Location: [facility] ENDOSCOPY;  Service: Pulmonary;;   BRONCHIAL BRUSHINGS  12/16/2022   Procedure: BRONCHIAL BRUSHINGS;  Surgeon: [name], MD;  Location: [facility] ENDOSCOPY;  Service: Pulmonary;;   BRONCHIAL NEEDLE ASPIRATION BIOPSY  12/16/2022   Procedure: BRONCHIAL NEEDLE ASPIRATION BIOPSIES;  Surgeon: [name], MD;  Location: [facility] ENDOSCOPY;  Service: Pulmonary;;   FIDUCIAL MARKER PLACEMENT  12/16/2022   Procedure: FIDUCIAL MARKER PLACEMENT;  Surgeon: [name], MD;  Location: [facility] ENDOSCOPY;  Service: Pulmonary;;    Prior to Admission medications   Medication Sig Start Date End Date Taking? Authorizing Provider  acetaminophen (TYLENOL) 500 MG tablet Take 1,000 mg by mouth every 8 (eight) hours as needed for moderate pain (pain score 4-6).   Yes [provider]  albuterol (VENTOLIN HFA) 108 (90 Base) MCG/ACT inhaler Inhale 2 puffs into the lungs every 4 (four)  hours as needed for wheezing or shortness of breath. 01/13/23  Yes [name], MD  Multiple Vitamin (MULTIVITAMIN WITH MINERALS) TABS tablet Take 1 tablet by mouth daily. 11/16/20  Yes Glade Lloyd, MD  naproxen sodium (ALEVE) 220 MG tablet Take 220 mg by mouth.   Yes [provider]  buPROPion (WELLBUTRIN XL) 150 MG 24 hr tablet Take 1 tablet (150 mg total) by mouth daily. For smoking cessation Patient not taking: Reported on 08/11/2023 02/07/23   Claiborne Rigg, NP  Fluticasone-Umeclidin-Vilant (TRELEGY ELLIPTA) 100-62.5-25 MCG/ACT AEPB Inhale 1 puff into the lungs daily. Patient not taking: Reported on 08/11/2023 02/07/23   Claiborne Rigg, NP  melatonin 3 MG TABS tablet Take 1 tablet (3 mg total) by mouth at bedtime. Patient not taking: Reported on 07/11/2023 11/29/20   [name], MD  nicotine (NICODERM CQ - DOSED IN MG/24 HOURS) 14 mg/24hr patch Place 1 patch (14 mg total) onto the skin daily. Apply 14 mg patch daily x 6 wk, then 7 mg patch daily x 2 wk Patient not taking: Reported on 08/11/2023 03/06/23   Erven Colla, PA-C  nicotine (NICODERM CQ - DOSED IN MG/24 HR) 7 mg/24hr patch Place 1 patch (7 mg total) onto the skin daily. Apply 14 mg patch daily x 6 wk, then 7 mg patch daily x 2 wk Patient not taking: Reported on 08/11/2023 03/06/23   Erven Colla, PA-C  tamsulosin (FLOMAX) 0.4 MG CAPS capsule Take 1 capsule (0.4  mg total) by mouth daily. Patient not taking: Reported on 08/11/2023 02/07/23   Claiborne Rigg, NP  thiamine 100 MG tablet Take 1 tablet (100 mg total) by mouth daily. Patient not taking: Reported on 08/11/2023 11/16/20   Glade Lloyd, MD  traZODone (DESYREL) 50 MG tablet Take 1 tablet (50 mg total) by mouth at bedtime as needed for sleep. 02/07/23   Claiborne Rigg, NP    Current Outpatient Medications  Medication Sig Dispense Refill   acetaminophen (TYLENOL) 500 MG tablet Take 1,000 mg by mouth every 8 (eight) hours as needed for moderate pain (pain score 4-6).      albuterol (VENTOLIN HFA) 108 (90 Base) MCG/ACT inhaler Inhale 2 puffs into the lungs every 4 (four) hours as needed for wheezing or shortness of breath. 8.5 g 6   Multiple Vitamin (MULTIVITAMIN WITH MINERALS) TABS tablet Take 1 tablet by mouth daily. 30 tablet 0   naproxen sodium (ALEVE) 220 MG tablet Take 220 mg by mouth.     buPROPion (WELLBUTRIN XL) 150 MG 24 hr tablet Take 1 tablet (150 mg total) by mouth daily. For smoking cessation (Patient not taking: Reported on 08/11/2023) 30 tablet 6   Fluticasone-Umeclidin-Vilant (TRELEGY ELLIPTA) 100-62.5-25 MCG/ACT AEPB Inhale 1 puff into the lungs daily. (Patient not taking: Reported on 08/11/2023) 60 each 6   melatonin 3 MG TABS tablet Take 1 tablet (3 mg total) by mouth at bedtime. (Patient not taking: Reported on 07/11/2023) 30 tablet 3   nicotine (NICODERM CQ - DOSED IN MG/24 HOURS) 14 mg/24hr patch Place 1 patch (14 mg total) onto the skin daily. Apply 14 mg patch daily x 6 wk, then 7 mg patch daily x 2 wk (Patient not taking: Reported on 08/11/2023) 14 patch 0   nicotine (NICODERM CQ - DOSED IN MG/24 HR) 7 mg/24hr patch Place 1 patch (7 mg total) onto the skin daily. Apply 14 mg patch daily x 6 wk, then 7 mg patch daily x 2 wk (Patient not taking: Reported on 08/11/2023) 14 patch 0   tamsulosin (FLOMAX) 0.4 MG CAPS capsule Take 1 capsule (0.4 mg total) by mouth daily. (Patient not taking: Reported on 08/11/2023) 30 capsule 6   thiamine 100 MG tablet Take 1 tablet (100 mg total) by mouth daily. (Patient not taking: Reported on 08/11/2023) 30 tablet 0   traZODone (DESYREL) 50 MG tablet Take 1 tablet (50 mg total) by mouth at bedtime as needed for sleep. 30 tablet 6   Current Facility-Administered Medications  Medication Dose Route Frequency Provider Last Rate Last Admin   0.9 %  sodium chloride infusion  500 mL Intravenous Once Jenel Lucks, MD        Allergies as of 08/11/2023   (No Known Allergies)    Family History  Problem Relation Age of  Onset   Cancer Mother    Heart disease Father    Stomach cancer Maternal Aunt    Colon cancer Neg Hx    Colon polyps Neg Hx    Esophageal cancer Neg Hx    Rectal cancer Neg Hx     Social History   Socioeconomic History   Marital status: Single    Spouse name: Not on file   Number of children: Not on file   Years of education: Not on file   Highest education level: Not on file  Occupational History   Not on file  Tobacco Use   Smoking status: Some Days    Current packs/day: 1.00  Average packs/day: 1 pack/day for 55.3 years (55.3 ttl pk-yrs)    Types: Cigarettes    Start date: 1970   Smokeless tobacco: Never   Tobacco comments:    4 cigarettes smoked daily PAP 02/20/2022  Vaping Use   Vaping status: Never Used  Substance and Sexual Activity   Alcohol use: Yes   Drug use: Yes    Frequency: 3.0 times per week    Types: Marijuana   Sexual activity: Not Currently  Other Topics Concern   Not on file  Social History Narrative   Lives with sister and grown nephew   Social Drivers of Health   Financial Resource Strain: Low Risk  (04/01/2023)   Overall Financial Resource Strain (CARDIA)    Difficulty of Paying Living Expenses: Not hard at all  Food Insecurity: No Food Insecurity (04/01/2023)   Hunger Vital Sign    Worried About Running Out of Food in the Last Year: Never true    Ran Out of Food in the Last Year: Never true  Transportation Needs: No Transportation Needs (04/01/2023)   PRAPARE - Administrator, Civil Service (Medical): No    Lack of Transportation (Non-Medical): No  Physical Activity: Inactive (04/01/2023)   Exercise Vital Sign    Days of Exercise per Week: 0 days    Minutes of Exercise per Session: 0 min  Stress: No Stress Concern Present (04/01/2023)   [employer] of Occupational Health - Occupational Stress Questionnaire    Feeling of Stress : Not at all  Social Connections: Socially Isolated (04/01/2023)   Social Connection  and Isolation Panel [NHANES]    Frequency of Communication with Friends and Family: More than three times a week    Frequency of Social Gatherings with Friends and Family: Once a week    Attends Religious Services: Never    Database administrator or Organizations: No    Attends Banker Meetings: Never    Marital Status: Never married  Intimate Partner Violence: Not At Risk (04/01/2023)   Humiliation, Afraid, Rape, and Kick questionnaire    Fear of Current or Ex-Partner: No    Emotionally Abused: No    Physically Abused: No    Sexually Abused: No    Review of Systems:  All other review of systems negative except as mentioned in the HPI.  Physical Exam: Vital signs BP 126/70   Pulse 94   Temp 98.1 F (36.7 C) (Temporal)   Ht 6' (1.829 m)   Wt 136 lb (61.7 kg)   SpO2 94%   BMI 18.44 kg/m   General:   Alert,  Well-developed, well-nourished, pleasant and cooperative in NAD Airway:  Mallampati 2 Lungs:  Clear throughout to auscultation.   Heart:  Regular rate and rhythm; no murmurs, clicks, rubs,  or gallops. Abdomen:  Soft, nontender and nondistended. Normal bowel sounds.   Neuro/Psych:  Normal mood and affect. A and O x 3   Lorien Shingler E. Tomasa Rand, MD [facility]

## 2023-08-12 ENCOUNTER — Telehealth: Payer: Self-pay | Admitting: *Deleted

## 2023-08-12 NOTE — Telephone Encounter (Signed)
  Follow up Call-     08/11/2023   12:41 PM  Call back number  Post procedure Call Back phone  # [phone]  Permission to leave phone message Yes     Patient questions:  Do you have a fever, pain , or abdominal swelling? No. Pain Score  0 *  Have you tolerated food without any problems? Yes.    Have you been able to return to your normal activities? Yes.    Do you have any questions about your discharge instructions: Diet   No. Medications  No. Follow up visit  No.  Do you have questions or concerns about your Care? No.  Actions: * If pain score is 4 or above: No action needed, pain <4.

## 2023-08-14 LAB — SURGICAL PATHOLOGY

## 2023-08-18 ENCOUNTER — Encounter: Payer: Self-pay | Admitting: Gastroenterology

## 2023-11-04 ENCOUNTER — Ambulatory Visit (HOSPITAL_COMMUNITY): Attending: Internal Medicine

## 2023-11-04 ENCOUNTER — Inpatient Hospital Stay: Payer: Medicare HMO | Attending: Internal Medicine

## 2023-11-04 DIAGNOSIS — C3431 Malignant neoplasm of lower lobe, right bronchus or lung: Secondary | ICD-10-CM | POA: Insufficient documentation

## 2023-11-04 DIAGNOSIS — F17210 Nicotine dependence, cigarettes, uncomplicated: Secondary | ICD-10-CM | POA: Insufficient documentation

## 2023-11-04 DIAGNOSIS — Z923 Personal history of irradiation: Secondary | ICD-10-CM | POA: Diagnosis not present

## 2023-11-04 DIAGNOSIS — R059 Cough, unspecified: Secondary | ICD-10-CM | POA: Diagnosis not present

## 2023-11-04 DIAGNOSIS — C3490 Malignant neoplasm of unspecified part of unspecified bronchus or lung: Secondary | ICD-10-CM

## 2023-11-04 DIAGNOSIS — R0602 Shortness of breath: Secondary | ICD-10-CM | POA: Insufficient documentation

## 2023-11-04 LAB — CBC WITH DIFFERENTIAL (CANCER CENTER ONLY)
Abs Immature Granulocytes: 0.01 10*3/uL (ref 0.00–0.07)
Basophils Absolute: 0.1 10*3/uL (ref 0.0–0.1)
Basophils Relative: 1 %
Eosinophils Absolute: 0.2 10*3/uL (ref 0.0–0.5)
Eosinophils Relative: 3 %
HCT: 51.3 % (ref 39.0–52.0)
Hemoglobin: 17.6 g/dL — ABNORMAL HIGH (ref 13.0–17.0)
Immature Granulocytes: 0 %
Lymphocytes Relative: 14 %
Lymphs Abs: 1 10*3/uL (ref 0.7–4.0)
MCH: 28.9 pg (ref 26.0–34.0)
MCHC: 34.3 g/dL (ref 30.0–36.0)
MCV: 84.1 fL (ref 80.0–100.0)
Monocytes Absolute: 1 10*3/uL (ref 0.1–1.0)
Monocytes Relative: 13 %
Neutro Abs: 5 10*3/uL (ref 1.7–7.7)
Neutrophils Relative %: 69 %
Platelet Count: 264 10*3/uL (ref 150–400)
RBC: 6.1 MIL/uL — ABNORMAL HIGH (ref 4.22–5.81)
RDW: 12.7 % (ref 11.5–15.5)
WBC Count: 7.2 10*3/uL (ref 4.0–10.5)
nRBC: 0 % (ref 0.0–0.2)

## 2023-11-04 LAB — CMP (CANCER CENTER ONLY)
ALT: 13 U/L (ref 0–44)
AST: 20 U/L (ref 15–41)
Albumin: 3.8 g/dL (ref 3.5–5.0)
Alkaline Phosphatase: 110 U/L (ref 38–126)
Anion gap: 6 (ref 5–15)
BUN: 6 mg/dL — ABNORMAL LOW (ref 8–23)
CO2: 31 mmol/L (ref 22–32)
Calcium: 9.4 mg/dL (ref 8.9–10.3)
Chloride: 103 mmol/L (ref 98–111)
Creatinine: 0.91 mg/dL (ref 0.61–1.24)
GFR, Estimated: >60 mL/min (ref >60)
Glucose, Bld: 93 mg/dL (ref 70–99)
Potassium: 4.5 mmol/L (ref 3.5–5.1)
Sodium: 140 mmol/L (ref 135–145)
Total Bilirubin: 1.4 mg/dL — ABNORMAL HIGH (ref 0.0–1.2)
Total Protein: 7.4 g/dL (ref 6.5–8.1)

## 2023-11-10 ENCOUNTER — Telehealth: Payer: Self-pay | Admitting: Medical Oncology

## 2023-11-10 NOTE — Telephone Encounter (Signed)
 LVM that I cancelled his appt scheduled for tomorrow because he did not get a CT scan. I gave him the number to return my call.

## 2023-11-11 ENCOUNTER — Inpatient Hospital Stay: Payer: Medicare HMO | Admitting: Internal Medicine

## 2023-11-18 ENCOUNTER — Telehealth: Payer: Self-pay

## 2023-11-18 NOTE — Telephone Encounter (Signed)
 Spoke with patient regarding missed CT scan appointment. Contacted centralized scheduling and rescheduled CT scan for Monday at [facility] at 8:00 AM. Patient voiced understanding.

## 2023-11-19 ENCOUNTER — Telehealth: Payer: Self-pay | Admitting: Internal Medicine

## 2023-11-19 NOTE — Telephone Encounter (Signed)
Scheduled appointments with the patient

## 2023-11-24 ENCOUNTER — Other Ambulatory Visit: Payer: Self-pay

## 2023-11-24 ENCOUNTER — Ambulatory Visit (HOSPITAL_COMMUNITY)
Admission: RE | Admit: 2023-11-24 | Discharge: 2023-11-24 | Disposition: A | Discharge status: Home / Self Care | Source: Ambulatory Visit | Attending: Internal Medicine | Admitting: Internal Medicine

## 2023-11-24 DIAGNOSIS — J90 Pleural effusion, not elsewhere classified: Secondary | ICD-10-CM | POA: Diagnosis not present

## 2023-11-24 DIAGNOSIS — C3490 Malignant neoplasm of unspecified part of unspecified bronchus or lung: Secondary | ICD-10-CM | POA: Insufficient documentation

## 2023-11-24 DIAGNOSIS — J432 Centrilobular emphysema: Secondary | ICD-10-CM | POA: Diagnosis not present

## 2023-11-24 DIAGNOSIS — R590 Localized enlarged lymph nodes: Secondary | ICD-10-CM | POA: Diagnosis not present

## 2023-11-24 MED ORDER — IOHEXOL 300 MG/ML  SOLN
75.0000 mL | Freq: Once | INTRAMUSCULAR | Status: AC | PRN
Start: 2023-11-24 — End: 2023-11-24
  Administered 2023-11-24: 75 mL via INTRAVENOUS

## 2023-12-11 ENCOUNTER — Inpatient Hospital Stay: Attending: Internal Medicine | Admitting: Internal Medicine

## 2023-12-11 VITALS — BP 109/79 | HR 102 | Temp 98.1°F | Resp 17 | Ht 72.0 in | Wt 130.0 lb

## 2023-12-11 DIAGNOSIS — R634 Abnormal weight loss: Secondary | ICD-10-CM | POA: Diagnosis not present

## 2023-12-11 DIAGNOSIS — C3490 Malignant neoplasm of unspecified part of unspecified bronchus or lung: Secondary | ICD-10-CM | POA: Diagnosis not present

## 2023-12-11 DIAGNOSIS — R0600 Dyspnea, unspecified: Secondary | ICD-10-CM | POA: Diagnosis not present

## 2023-12-11 DIAGNOSIS — R918 Other nonspecific abnormal finding of lung field: Secondary | ICD-10-CM | POA: Insufficient documentation

## 2023-12-11 DIAGNOSIS — J701 Chronic and other pulmonary manifestations due to radiation: Secondary | ICD-10-CM | POA: Insufficient documentation

## 2023-12-11 DIAGNOSIS — C3431 Malignant neoplasm of lower lobe, right bronchus or lung: Secondary | ICD-10-CM | POA: Diagnosis not present

## 2023-12-11 DIAGNOSIS — Z79899 Other long term (current) drug therapy: Secondary | ICD-10-CM | POA: Insufficient documentation

## 2023-12-11 NOTE — Progress Notes (Signed)
 [facility] [phone]   [phone]  OFFICE PROGRESS NOTE  Marcus Clam, MD [address] [place] [address] [place] [address]  DIAGNOSIS: Stage IIB (T3, N0, M0) non-small cell lung cancer, adenocarcinoma. He presented with 2 pulmonary nodules in the right lower lobe.    Request PDL1 and [facility] One today    PRIOR THERAPY: SBRT to the 2 pulmonary nodules in the right lung under the care of Dr. Shannon.  He is scheduled to start on 01/20/2023.   CURRENT THERAPY: Observation  INTERVAL HISTORY: Marcus Pace 68 y.o. male returns to the clinic today for follow-up visit accompanied by his mother.Discussed the use of AI scribe software for clinical note transcription with the patient, who gave verbal consent to proceed.  History of Present Illness Marcus Pace is a 68 year old male with lung cancer who presents for evaluation and repeat CT scan of the chest for restaging of his disease. He is accompanied by his older sister.  He has a history of lung cancer and underwent stereotactic body radiation therapy (SBRT) in September 2024.  He continues to experience shortness of breath, which has not improved with the use of an inhaler. No chest pain, continuous cough, or hemoptysis. He has lost approximately five pounds since his last visit, going from 136 pounds to 131 pounds, but he has not noticed any significant weight loss.  A previous scan showed significant scarring in the area where he received radiation, and there are two small nodules in the right upper lobe of his lung. He recalls having a PET scan in the past.     MEDICAL HISTORY: Past Medical History:  Diagnosis Date   Arthritis    Bacteremia    2016 or so with hospitalization   Emphysema of lung (HCC) 07/20/2019   History of radiation therapy    Right Lung, 01/29/2023 - 02/03/2023 Dr. Shannon   Inguinal hernia recurrent unilateral    herniation of mesentary and loops of  bowel to right scrotal sac, 4.5 cm defect   Lesion of right lung 07/20/2019   Lumbar radiculopathy 02/15/2021   Pulmonary emboli (HCC) 07/20/2019    ALLERGIES:  has no known allergies.  MEDICATIONS:  Current Outpatient Medications  Medication Sig Dispense Refill   acetaminophen  (TYLENOL ) 500 MG tablet Take 1,000 mg by mouth every 8 (eight) hours as needed for moderate pain (pain score 4-6).     albuterol  (VENTOLIN  HFA) 108 (90 Base) MCG/ACT inhaler Inhale 2 puffs into the lungs every 4 (four) hours as needed for wheezing or shortness of breath. 8.5 g 6   buPROPion  (WELLBUTRIN  XL) 150 MG 24 hr tablet Take 1 tablet (150 mg total) by mouth daily. For smoking cessation (Patient not taking: Reported on 08/11/2023) 30 tablet 6   Fluticasone -Umeclidin-Vilant (TRELEGY ELLIPTA ) 100-62.5-25 MCG/ACT AEPB Inhale 1 puff into the lungs daily. (Patient not taking: Reported on 08/11/2023) 60 each 6   melatonin 3 MG TABS tablet Take 1 tablet (3 mg total) by mouth at bedtime. (Patient not taking: Reported on 07/11/2023) 30 tablet 3   Multiple Vitamin (MULTIVITAMIN WITH MINERALS) TABS tablet Take 1 tablet by mouth daily. 30 tablet 0   naproxen sodium (ALEVE) 220 MG tablet Take 220 mg by mouth.     nicotine  (NICODERM CQ  - DOSED IN MG/24 HOURS) 14 mg/24hr patch Place 1 patch (14 mg total) onto the skin daily. Apply 14 mg patch daily x 6 wk, then 7 mg  patch daily x 2 wk (Patient not taking: Reported on 08/11/2023) 14 patch 0   nicotine  (NICODERM CQ  - DOSED IN MG/24 HR) 7 mg/24hr patch Place 1 patch (7 mg total) onto the skin daily. Apply 14 mg patch daily x 6 wk, then 7 mg patch daily x 2 wk (Patient not taking: Reported on 08/11/2023) 14 patch 0   tamsulosin  (FLOMAX ) 0.4 MG CAPS capsule Take 1 capsule (0.4 mg total) by mouth daily. (Patient not taking: Reported on 08/11/2023) 30 capsule 6   thiamine  100 MG tablet Take 1 tablet (100 mg total) by mouth daily. (Patient not taking: Reported on 08/11/2023) 30 tablet 0   traZODone   (DESYREL ) 50 MG tablet Take 1 tablet (50 mg total) by mouth at bedtime as needed for sleep. 30 tablet 6   No current facility-administered medications for this visit.    SURGICAL HISTORY:  Past Surgical History:  Procedure Laterality Date   BRONCHIAL BIOPSY  12/16/2022   Procedure: BRONCHIAL BIOPSIES;  Surgeon: Marcus Lamar RAMAN, MD;  Location: [facility] ENDOSCOPY;  Service: Pulmonary;;   BRONCHIAL BRUSHINGS  12/16/2022   Procedure: BRONCHIAL BRUSHINGS;  Surgeon: Marcus Lamar RAMAN, MD;  Location[facility] ENDOSCOPY;  Service: Pulmonary;;   BRONCHIAL NEEDLE ASPIRATION BIOPSY  12/16/2022   Procedure: BRONCHIAL NEEDLE ASPIRATION BIOPSIES;  Surgeon: Marcus Lamar RAMAN, MD;  Location: [facility] ENDOSCOPY;  Service: Pulmonary;;   FIDUCIAL MARKER PLACEMENT  12/16/2022   Procedure: FIDUCIAL MARKER PLACEMENT;  Surgeon: Marcus Lamar RAMAN, MD;  Location: [facility] ENDOSCOPY;  Service: Pulmonary;;    REVIEW OF SYSTEMS:  A comprehensive review of systems was negative except for: Respiratory: positive for cough and dyspnea on exertion   PHYSICAL EXAMINATION: General appearance: alert, cooperative, fatigued, and no distress Head: Normocephalic, without obvious abnormality, atraumatic Neck: no adenopathy, no JVD, supple, symmetrical, trachea midline, and thyroid not enlarged, symmetric, no tenderness/mass/nodules Lymph nodes: Cervical, supraclavicular, and axillary nodes normal. Resp: clear to auscultation bilaterally Back: symmetric, no curvature. ROM normal. No CVA tenderness. Cardio: regular rate and rhythm, S1, S2 normal, no murmur, click, rub or gallop GI: soft, non-tender; bowel sounds normal; no masses,  no organomegaly Extremities: extremities normal, atraumatic, no cyanosis or edema  ECOG PERFORMANCE STATUS: 1 - Symptomatic but completely ambulatory  Blood pressure 109/79, pulse (!) 102, temperature 98.1 F (36.7 C), temperature source Temporal, resp. rate 17, height 6' (1.829 m), weight 130 lb (59 kg), SpO2  95%.  LABORATORY DATA: Lab Results  Component Value Date   WBC 7.2 11/04/2023   HGB 17.6 (H) 11/04/2023   HCT 51.3 11/04/2023   MCV 84.1 11/04/2023   PLT 264 11/04/2023      Chemistry      Component Value Date/Time   NA 140 11/04/2023 0736   NA 142 12/05/2021 1610   K 4.5 11/04/2023 0736   CL 103 11/04/2023 0736   CO2 31 11/04/2023 0736   BUN 6 (L) 11/04/2023 0736   BUN 10 12/05/2021 1610   CREATININE 0.91 11/04/2023 0736      Component Value Date/Time   CALCIUM 9.4 11/04/2023 0736   ALKPHOS 110 11/04/2023 0736   AST 20 11/04/2023 0736   ALT 13 11/04/2023 0736   BILITOT 1.4 (H) 11/04/2023 0736       RADIOGRAPHIC STUDIES: CT Chest W Contrast Result Date: 11/24/2023 CLINICAL DATA:  Non-small cell lung cancer, staging. * Tracking Code: BO * EXAM: CT CHEST WITH CONTRAST TECHNIQUE: Multidetector CT imaging of the chest was performed during intravenous contrast administration. RADIATION DOSE REDUCTION:  This exam was performed according to the departmental dose-optimization program which includes automated exposure control, adjustment of the mA and/or kV according to patient size and/or use of iterative reconstruction technique. CONTRAST:  75mL OMNIPAQUE  IOHEXOL  300 MG/ML  SOLN COMPARISON:  Multiple priors including CT May 08, 2023. FINDINGS: Cardiovascular: Normal caliber thoracic aorta. Normal size heart. No significant pericardial effusion/thickening. Mediastinum/Nodes: No suspicious thyroid nodule. No pathologically enlarged mediastinal, hilar or axillary lymph nodes. Prominent subcarinal lymph node measures 8 mm in short axis on image 88/2 previously 5 mm. Lungs/Pleura: New extensive consolidation with architectural distortion in the right lower lobe and right middle lobe obscuring the previously indexed pulmonary nodules. Fiducial markers in place. Increased size of a 2 adjacent nodules in the right upper lobe for instance measuring 6 mm on image 73/8 and 6 mm on image 71/8  previously both measuring 4 mm. Additional nodular and banded densities are similar to prior examination. Similar scarring linear band of scarring in the left upper lobe. Centrilobular and paraseptal emphysema. Stable trace left with slightly increased small right partially loculated pleural effusions. Upper Abdomen: No acute abnormality. No suspicious adrenal nodule/mass. Musculoskeletal: No aggressive lytic or blastic lesion of bone. Multilevel degenerative change of the spine. IMPRESSION: 1. New extensive consolidation with architectural distortion in the right lower lobe and right middle lobe obscuring the previously indexed pulmonary nodules, compatible with postradiation change. 2. Increased size of a 2 adjacent nodules in the right upper lobe measuring 6 mm, nonspecific but suspicious for metastatic disease. 3. Prominent subcarinal lymph node measures 8 mm in short axis previously 5 mm, nonspecific but possibly reactive. 4. Stable trace left with slightly increased small right partially loculated pleural effusions. Electronically Signed   By: Reyes Holder M.D.   On: 11/24/2023 16:20    ASSESSMENT AND PLAN: This is a very pleasant 68 years old African-American male with Stage IIB (T3, N0, M0) non-small cell lung cancer, adenocarcinoma. He presented with 2 pulmonary nodules in the right lower lobe status post SBRT to the right lung nodule.  He is currently on observation. He had repeat CT scan of the chest performed recently.  I personally independently reviewed the scan images and discussed the result with the patient and his sister.  His scan showed new extensive consolidation with architectural distortion in the right lower lobe and right middle lobe obscuring the previously and exit pulmonary nodules.  There was also increased size of 2 adjacent nodules in the right upper lobe measuring 6 mm but suspicious for metastatic disease. Assessment and Plan Assessment & Plan Right lung cancer status post  SBRT with new right upper lobe pulmonary nodules under evaluation Status post SBRT for right lung cancer with new findings of two small nodules in the right upper lobe. Concern for possible malignancy recurrence or progression. Differential includes benign post-radiation changes versus malignant nodules. - Order PET scan to evaluate the scarring and assess the two nodules for potential malignancy.  Radiation-induced pulmonary fibrosis Significant scarring in the area of previous radiation treatment, consistent with radiation-induced pulmonary fibrosis.  Dyspnea Persistent dyspnea, no significant change since last visit. No associated chest pain or hemoptysis. Dyspnea likely related to underlying pulmonary fibrosis and possible new nodules.  Unintentional weight loss Reported weight loss of approximately five pounds since last visit. He was advised to call immediately if he has any other concerning symptoms in the interval.  The patient voices understanding of current disease status and treatment options and is in agreement with the current  care plan.  All questions were answered. The patient knows to call the clinic with any problems, questions or concerns. We can certainly see the patient much sooner if necessary.  The total time spent in the appointment was 20 minutes.  Disclaimer: This note was dictated with voice recognition software. Similar sounding words can inadvertently be transcribed and may not be corrected upon review.

## 2023-12-19 ENCOUNTER — Ambulatory Visit (HOSPITAL_COMMUNITY)
Admission: RE | Admit: 2023-12-19 | Discharge: 2023-12-19 | Disposition: A | Discharge status: Home / Self Care | Source: Ambulatory Visit | Attending: Internal Medicine | Admitting: Internal Medicine

## 2023-12-19 ENCOUNTER — Telehealth: Payer: Self-pay | Admitting: Physician Assistant

## 2023-12-19 DIAGNOSIS — C3490 Malignant neoplasm of unspecified part of unspecified bronchus or lung: Secondary | ICD-10-CM | POA: Insufficient documentation

## 2023-12-19 DIAGNOSIS — C3431 Malignant neoplasm of lower lobe, right bronchus or lung: Secondary | ICD-10-CM | POA: Diagnosis not present

## 2023-12-19 LAB — GLUCOSE, CAPILLARY: Glucose-Capillary: 91 mg/dL (ref 70–99)

## 2023-12-19 MED ORDER — FLUDEOXYGLUCOSE F - 18 (FDG) INJECTION
6.4500 | Freq: Once | INTRAVENOUS | Status: AC
  Administered 2023-12-19: 6.45 via INTRAVENOUS

## 2023-12-19 NOTE — Telephone Encounter (Signed)
 Rescheduled appointments per provider requested. Patient aware of changes made

## 2024-01-01 ENCOUNTER — Other Ambulatory Visit

## 2024-01-01 ENCOUNTER — Ambulatory Visit: Admitting: Physician Assistant

## 2024-01-03 NOTE — Progress Notes (Unsigned)
 [facility] OFFICE PROGRESS NOTE  Marcus Clam, MD [address] [place] [address] [place] [address]  DIAGNOSIS: Stage IIB (T3, N0, M0) non-small cell lung cancer, adenocarcinoma. He presented with 2 pulmonary nodules in the right lower lobe. Concern for progression in August 2025 with pulmonary metastasis in the RUL, right hilar adenopathy, and left 6th rib sclerotic lesion.   PDL1 expression 20% [employer] One: No actionable mutations   PRIOR THERAPY: SBRT to the 2 pulmonary nodules in the right lung under the care of Dr. Shannon. He is scheduled to start on 01/20/2023.   CURRENT THERAPY: Referral to pulmonary medicine for consideration of re-biopsy.   INTERVAL HISTORY: Marcus Pace 68 y.o. male returns to the clinic today for a follow up visit. The patient was last seen in the clinic by Dr. Sherrod on 12/11/23. The patient has a history of lung cancer for which he underwent SBRT in September 2024. He was on observation with routine surveillance CT scans.   At his last appointment, his scan showed increase size of 2 adjacent nodules in the RUL which are non specific as well as a prominent subcarinal lymph node.   Therefore, Dr. Sherrod ordered a PET scan to further assess.   Otherwise, he denies changes in his health since he was last seen. He continues to experience shortness of breath intermittently due to his COPD which he states is stable. He does cough on and off for which he will use halls. He tends to notice the cough more when he gets strangled on something he is drinking. No chest pain or hemoptysis. He denies fevers, chills, or night sweats.He denies nausea, vomiting, diarrhea, or constipation. She denies headaches or vision changes. He is accompanied by his sister today. She is here for evaluation and repeat blood work and to review his scan results.    MEDICAL HISTORY: Past Medical History:  Diagnosis Date   Arthritis    Bacteremia    2016 or so with  hospitalization   Emphysema of lung (HCC) 07/20/2019   History of radiation therapy    Right Lung, 01/29/2023 - 02/03/2023 Dr. Shannon   Inguinal hernia recurrent unilateral    herniation of mesentary and loops of bowel to right scrotal sac, 4.5 cm defect   Lesion of right lung 07/20/2019   Lumbar radiculopathy 02/15/2021   Pulmonary emboli (HCC) 07/20/2019    ALLERGIES:  has no known allergies.  MEDICATIONS:  Current Outpatient Medications  Medication Sig Dispense Refill   acetaminophen  (TYLENOL ) 500 MG tablet Take 1,000 mg by mouth every 8 (eight) hours as needed for moderate pain (pain score 4-6).     albuterol  (VENTOLIN  HFA) 108 (90 Base) MCG/ACT inhaler Inhale 2 puffs into the lungs every 4 (four) hours as needed for wheezing or shortness of breath. 8.5 g 6   buPROPion  (WELLBUTRIN  XL) 150 MG 24 hr tablet Take 1 tablet (150 mg total) by mouth daily. For smoking cessation (Patient not taking: Reported on 08/11/2023) 30 tablet 6   Fluticasone -Umeclidin-Vilant (TRELEGY ELLIPTA ) 100-62.5-25 MCG/ACT AEPB Inhale 1 puff into the lungs daily. (Patient not taking: Reported on 08/11/2023) 60 each 6   melatonin 3 MG TABS tablet Take 1 tablet (3 mg total) by mouth at bedtime. (Patient not taking: Reported on 07/11/2023) 30 tablet 3   Multiple Vitamin (MULTIVITAMIN WITH MINERALS) TABS tablet Take 1 tablet by mouth daily. 30 tablet 0   naproxen sodium (ALEVE) 220 MG tablet Take 220 mg by mouth.  nicotine  (NICODERM CQ  - DOSED IN MG/24 HOURS) 14 mg/24hr patch Place 1 patch (14 mg total) onto the skin daily. Apply 14 mg patch daily x 6 wk, then 7 mg patch daily x 2 wk (Patient not taking: Reported on 08/11/2023) 14 patch 0   nicotine  (NICODERM CQ  - DOSED IN MG/24 HR) 7 mg/24hr patch Place 1 patch (7 mg total) onto the skin daily. Apply 14 mg patch daily x 6 wk, then 7 mg patch daily x 2 wk (Patient not taking: Reported on 08/11/2023) 14 patch 0   tamsulosin  (FLOMAX ) 0.4 MG CAPS capsule Take 1 capsule (0.4 mg  total) by mouth daily. (Patient not taking: Reported on 08/11/2023) 30 capsule 6   thiamine  100 MG tablet Take 1 tablet (100 mg total) by mouth daily. (Patient not taking: Reported on 08/11/2023) 30 tablet 0   traZODone  (DESYREL ) 50 MG tablet Take 1 tablet (50 mg total) by mouth at bedtime as needed for sleep. 30 tablet 6   No current facility-administered medications for this visit.    SURGICAL HISTORY:  Past Surgical History:  Procedure Laterality Date   BRONCHIAL BIOPSY  12/16/2022   Procedure: BRONCHIAL BIOPSIES;  Surgeon: Shelah Lamar RAMAN, MD;  Location: [facility] ENDOSCOPY;  Service: Pulmonary;;   BRONCHIAL BRUSHINGS  12/16/2022   Procedure: BRONCHIAL BRUSHINGS;  Surgeon: Shelah Lamar RAMAN, MD;  Location: [facility] ENDOSCOPY;  Service: Pulmonary;;   BRONCHIAL NEEDLE ASPIRATION BIOPSY  12/16/2022   Procedure: BRONCHIAL NEEDLE ASPIRATION BIOPSIES;  Surgeon: Shelah Lamar RAMAN, MD;  Location: [facility] ENDOSCOPY;  Service: Pulmonary;;   FIDUCIAL MARKER PLACEMENT  12/16/2022   Procedure: FIDUCIAL MARKER PLACEMENT;  Surgeon: Shelah Lamar RAMAN, MD;  Location: [facility] ENDOSCOPY;  Service: Pulmonary;;    REVIEW OF SYSTEMS:   Review of Systems  Constitutional: Negative for appetite change, chills, fatigue, fever and unexpected weight change.  HENT: Negative for mouth sores, nosebleeds, sore throat and trouble swallowing.   Eyes: Negative for eye problems and icterus.  Respiratory: Positive for stable intermittent cough and intermittent dyspnea on exertion. Negative for hemoptysis and wheezing.   Cardiovascular: Negative for chest pain and leg swelling.  Gastrointestinal: Negative for abdominal pain, constipation, diarrhea, nausea and vomiting.  Genitourinary: Negative for bladder incontinence, difficulty urinating, dysuria, frequency and hematuria.   Musculoskeletal: Negative for back pain, gait problem, neck pain and neck stiffness.  Skin: Negative for itching and rash.  Neurological: Negative for dizziness, extremity  weakness, gait problem, headaches, light-headedness and seizures.  Hematological: Negative for adenopathy. Does not bruise/bleed easily.  Psychiatric/Behavioral: Negative for confusion, depression and sleep disturbance. The patient is not nervous/anxious.     PHYSICAL EXAMINATION:  There were no vitals taken for this visit.  ECOG PERFORMANCE STATUS: 1  Physical Exam  Constitutional: Oriented to person, place, and time and thin appearing male and in no distress.  HENT:  Head: Normocephalic and atraumatic.  Mouth/Throat: Oropharynx is clear and moist. No oropharyngeal exudate.  Eyes: Conjunctivae are normal. Right eye exhibits no discharge. Left eye exhibits no discharge. No scleral icterus.  Neck: Normal range of motion. Neck supple.  Cardiovascular: Normal rate, regular rhythm, normal heart sounds and intact distal pulses.   Pulmonary/Chest: Effort normal and breath sounds normal. No respiratory distress. No wheezes. No rales.  Abdominal: Soft. Bowel sounds are normal. Exhibits no distension and no mass. There is no tenderness.  Musculoskeletal: Normal range of motion. Exhibits no edema.  Lymphadenopathy:    No cervical adenopathy.  Neurological: Alert and oriented to person, place, and  time. Exhibits muscle wasting. Ambulates with a cane.  Skin: Skin is warm and dry. No rash noted. Not diaphoretic. No erythema. No pallor.  Psychiatric: Mood, memory and judgment normal.  Vitals reviewed.  LABORATORY DATA: Lab Results  Component Value Date   WBC 7.2 11/04/2023   HGB 17.6 (H) 11/04/2023   HCT 51.3 11/04/2023   MCV 84.1 11/04/2023   PLT 264 11/04/2023      Chemistry      Component Value Date/Time   NA 140 11/04/2023 0736   NA 142 12/05/2021 1610   K 4.5 11/04/2023 0736   CL 103 11/04/2023 0736   CO2 31 11/04/2023 0736   BUN 6 (L) 11/04/2023 0736   BUN 10 12/05/2021 1610   CREATININE 0.91 11/04/2023 0736      Component Value Date/Time   CALCIUM 9.4 11/04/2023 0736    ALKPHOS 110 11/04/2023 0736   AST 20 11/04/2023 0736   ALT 13 11/04/2023 0736   BILITOT 1.4 (H) 11/04/2023 0736       RADIOGRAPHIC STUDIES:  NM PET Image Restage (PS) Skull Base to Thigh (F-18 FDG) Result Date: 12/31/2023 CLINICAL DATA:  Subsequent treatment strategy for non-small cell lung cancer status post radiation therapy EXAM: NUCLEAR MEDICINE PET SKULL BASE TO THIGH TECHNIQUE: 6.4 mCi F-18 FDG was injected intravenously. Full-ring PET imaging was performed from the skull base to thigh after the radiotracer. CT data was obtained and used for attenuation correction and anatomic localization. Fasting blood glucose: 91 mg/dl COMPARISON:  Chest CT November 24, 2023, PET-CT January 03, 2023. FINDINGS: Mediastinal blood pool activity: SUV max 1.7 Liver activity: SUV max 2.5 NECK: No hypermetabolic lymph nodes in the neck. Incidental CT findings: None. CHEST: Platelike postradiation changes extending from anterior right upper lobe, to right middle lobe and right lower lobe with associated increased FDG uptake max SUV up to 5.4. Underlying primary malignancy is less conspicuous however demonstrates FDG uptake max SUV 5.2 versus previously 2.7 consistent with possible viable malignancy. Posttreatment changes contribute to this activity. There is interval increase in size and metabolic activity of a right upper lobe now bilobed nodule measuring 1.3 x 0.7 cm with max SUV 6.7, previously 0.8 x 0.5 cm max SUV 1.9. Possible right hilar subcentimeter lymph node max SUV 3.1, incompletely assessed on current noncontrast CT. Trace right pleural thickening and loculated effusion with mild FDG uptake. Incidental CT findings: Paraseptal and centrilobular emphysematous changes with stable paraseptal bulla. No pneumothorax. ABDOMEN/PELVIS: No abnormal hypermetabolic activity within the liver, pancreas, adrenal glands, or spleen. No hypermetabolic lymph nodes in the abdomen or pelvis. Incidental CT findings: Large right sided  inguinal hernia with multiple bowel loops within the right scrotal sac without strangulation or obstruction. SKELETON: Left sixth rib hypermetabolic sclerotic lesion likely representing metastasis. No definite fracture line identified. Photopenia of dorsal spine due to post radiation changes. Incidental CT findings: None. IMPRESSION: Posttreatment changes of right lower lobe malignancy with suggestion of viable residual disease. Interval progression of pulmonary metastasis in right upper lobe. Right hilar FDG uptake, possibly subcentimeter hypermetabolic lymph node, metastatic versus reactive. Left sixth rib sclerotic lesion most consistent with osseous the metastasis. Electronically Signed   By: Megan  Zare M.D.   On: 12/31/2023 14:28     ASSESSMENT/PLAN:  This is a very pleasant 68 year old African-American male who was initially diagnosed with Stage IIB (T3, N0, M0) non-small cell lung cancer, adenocarcinoma. He presented with 2 pulmonary nodules in the right lower lobe status post SBRT to  the right lung nodule in September 2024.  He is currently on observation.  His PDL1 expression is 2% and he has no actionable mutations.   In August 2025, he was found to have concerns for progression. Therefore, Dr. Sherrod recommended PET scan which showed pulmonary metastasis in the RUL, right hilar FGD update (reactive vs metastatic), and left 6th rib sclerotic lesion.   The patient was seen with Dr. Sherrod. Dr. Sherrod recommends repeat bronchoscopy and biopsy. I placed a referral to pulmonary medicine and have reached out to them.   We will see him back for labs and follow up in 3-4 weeks to review the pathology and a more detailed discussion about the next stems.   The patient was advised to call immediately if she has any concerning symptoms in the interval. The patient voices understanding of current disease status and treatment options and is in agreement with the current care plan. All questions  were answered. The patient knows to call the clinic with any problems, questions or concerns. We can certainly see the patient much sooner if necessary   No orders of the defined types were placed in this encounter.    I spent {CHL ONC TIME VISIT - DTPQU:[id number]} counseling the patient face to face. The total time spent in the appointment was {CHL ONC TIME VISIT - DTPQU:[id number]}.  Shellia Hartl L Ashely Joshua, PA-C 01/03/24

## 2024-01-07 ENCOUNTER — Other Ambulatory Visit: Payer: Self-pay | Admitting: Physician Assistant

## 2024-01-07 DIAGNOSIS — C3490 Malignant neoplasm of unspecified part of unspecified bronchus or lung: Secondary | ICD-10-CM

## 2024-01-08 ENCOUNTER — Inpatient Hospital Stay (HOSPITAL_BASED_OUTPATIENT_CLINIC_OR_DEPARTMENT_OTHER): Admitting: Physician Assistant

## 2024-01-08 ENCOUNTER — Inpatient Hospital Stay: Attending: Internal Medicine

## 2024-01-08 VITALS — BP 133/89 | HR 107 | Temp 98.0°F | Resp 16 | Ht 72.0 in | Wt 131.0 lb

## 2024-01-08 DIAGNOSIS — R599 Enlarged lymph nodes, unspecified: Secondary | ICD-10-CM | POA: Diagnosis not present

## 2024-01-08 DIAGNOSIS — M899 Disorder of bone, unspecified: Secondary | ICD-10-CM | POA: Insufficient documentation

## 2024-01-08 DIAGNOSIS — Z923 Personal history of irradiation: Secondary | ICD-10-CM | POA: Insufficient documentation

## 2024-01-08 DIAGNOSIS — C3431 Malignant neoplasm of lower lobe, right bronchus or lung: Secondary | ICD-10-CM | POA: Diagnosis not present

## 2024-01-08 DIAGNOSIS — J449 Chronic obstructive pulmonary disease, unspecified: Secondary | ICD-10-CM | POA: Insufficient documentation

## 2024-01-08 DIAGNOSIS — F17210 Nicotine dependence, cigarettes, uncomplicated: Secondary | ICD-10-CM | POA: Insufficient documentation

## 2024-01-08 DIAGNOSIS — C3490 Malignant neoplasm of unspecified part of unspecified bronchus or lung: Secondary | ICD-10-CM | POA: Diagnosis not present

## 2024-01-08 DIAGNOSIS — Z79899 Other long term (current) drug therapy: Secondary | ICD-10-CM | POA: Insufficient documentation

## 2024-01-08 DIAGNOSIS — R911 Solitary pulmonary nodule: Secondary | ICD-10-CM | POA: Insufficient documentation

## 2024-01-08 LAB — CMP (CANCER CENTER ONLY)
ALT: 16 U/L (ref 0–44)
AST: 23 U/L (ref 15–41)
Albumin: 4 g/dL (ref 3.5–5.0)
Alkaline Phosphatase: 104 U/L (ref 38–126)
Anion gap: 5 (ref 5–15)
BUN: 10 mg/dL (ref 8–23)
CO2: 31 mmol/L (ref 22–32)
Calcium: 10.1 mg/dL (ref 8.9–10.3)
Chloride: 105 mmol/L (ref 98–111)
Creatinine: 0.86 mg/dL (ref 0.61–1.24)
GFR, Estimated: >60 mL/min (ref >60)
Glucose, Bld: 83 mg/dL (ref 70–99)
Potassium: 4.1 mmol/L (ref 3.5–5.1)
Sodium: 141 mmol/L (ref 135–145)
Total Bilirubin: 1 mg/dL (ref 0.0–1.2)
Total Protein: 7.7 g/dL (ref 6.5–8.1)

## 2024-01-08 LAB — CBC WITH DIFFERENTIAL (CANCER CENTER ONLY)
Abs Immature Granulocytes: 0.02 K/uL (ref 0.00–0.07)
Basophils Absolute: 0.1 K/uL (ref 0.0–0.1)
Basophils Relative: 1 %
Eosinophils Absolute: 0.1 K/uL (ref 0.0–0.5)
Eosinophils Relative: 1 %
HCT: 49.1 % (ref 39.0–52.0)
Hemoglobin: 16.8 g/dL (ref 13.0–17.0)
Immature Granulocytes: 0 %
Lymphocytes Relative: 10 %
Lymphs Abs: 0.7 K/uL (ref 0.7–4.0)
MCH: 28.7 pg (ref 26.0–34.0)
MCHC: 34.2 g/dL (ref 30.0–36.0)
MCV: 83.8 fL (ref 80.0–100.0)
Monocytes Absolute: 0.8 K/uL (ref 0.1–1.0)
Monocytes Relative: 10 %
Neutro Abs: 5.8 K/uL (ref 1.7–7.7)
Neutrophils Relative %: 78 %
Platelet Count: 254 K/uL (ref 150–400)
RBC: 5.86 MIL/uL — ABNORMAL HIGH (ref 4.22–5.81)
RDW: 14.8 % (ref 11.5–15.5)
WBC Count: 7.5 K/uL (ref 4.0–10.5)
nRBC: 0 % (ref 0.0–0.2)

## 2024-01-09 ENCOUNTER — Telehealth: Payer: Self-pay | Admitting: Physician Assistant

## 2024-01-09 NOTE — Telephone Encounter (Signed)
 Scheduled patient appointments, called and left voicemail with the appointment details.

## 2024-01-13 ENCOUNTER — Telehealth: Payer: Self-pay | Admitting: Emergency Medicine

## 2024-01-13 DIAGNOSIS — R911 Solitary pulmonary nodule: Secondary | ICD-10-CM | POA: Insufficient documentation

## 2024-01-13 NOTE — Telephone Encounter (Signed)
 Spoke with the patient to review his CT scan of the chest and his PET scan.  Referred by Dr. Sherrod to consider repeat bronchoscopy.  Explained procedure to him.  He agrees and I will work on trying to set this up for 9/15-9/16.   PLease work on setting up:  Please schedule the following:  Provider performing procedure: Junetta Hearn Diagnosis: Right upper lobe nodule, mediastinal adenopathy Which side if for nodule / mass?  Right Procedure: Robotic assisted navigational bronchoscopy, EBUS Has patient been spoken to by Provider and given informed consent?  Yes Anesthesia: General Do you need Fluro?  Yes Duration of procedure: 60 minutes Date: 01/19/2024 Alternate Date: 01/20/2024 Time: Any Location: [facility] Does patient have OSA?  No DM?  No or Latex allergy?  No Medication Restriction/ Anticoagulate/Antiplatelet: None Pre-op Labs Ordered:determined by Anesthesia Imaging request: CT chest is available in PACS (If, SuperDimension CT Chest, please have STAT courier sent to ENDO)

## 2024-01-14 ENCOUNTER — Encounter: Payer: Self-pay | Admitting: Emergency Medicine

## 2024-01-14 NOTE — Telephone Encounter (Signed)
 Spoke to the patient and gave appt info will mail it to him also Case [id number] scheduled for 01/19/24 at 9:15 will send to [facility] to check the auth

## 2024-01-16 ENCOUNTER — Other Ambulatory Visit: Payer: Self-pay

## 2024-01-16 ENCOUNTER — Encounter (HOSPITAL_COMMUNITY): Payer: Self-pay | Admitting: Emergency Medicine

## 2024-01-16 NOTE — Progress Notes (Signed)
 PCP - Corrina Sabin, MD  Chest x-ray - DOS  Anesthesia review: N  Patient verbally denies any shortness of breath, fever, cough and chest pain during phone call   -------------  SDW INSTRUCTIONS given:  Your procedure is scheduled on Monday, Sept 15th.  Report to [facility] Main Entrance A at 0645 A.M., and check in at the Admitting office.  Call this number if you have problems the morning of surgery:  [phone]   Remember:  Do not eat or drink after midnight the night before your surgery    Take these medicines the morning of surgery with A SIP OF WATER  acetaminophen  (TYLENOL )-if needed. albuterol  (VENTOLIN  HFA)-if needed (Please bring with you on the day of surgery)  As of today, STOP taking any Aspirin (unless otherwise instructed by your surgeon) Aleve, Naproxen, Ibuprofen, Motrin, Advil, Goody's, BC's, all herbal medications, fish oil, and all vitamins.                      Do not wear jewelry, make up, or nail polish            Do not wear lotions, powders, perfumes/colognes, or deodorant.            Do not shave 48 hours prior to surgery.  Men may shave face and neck.            Do not bring valuables to the hospital.            [facility] is not responsible for any belongings or valuables.  Do NOT Smoke (Tobacco/Vaping) 24 hours prior to your procedure If you use a CPAP at night, you may bring all equipment for your overnight stay.   Contacts, glasses, dentures or bridgework may not be worn into surgery.      For patients admitted to the hospital, discharge time will be determined by your treatment team.   Patients discharged the day of surgery will not be allowed to drive home, and someone needs to stay with them for 24 hours.    Special instructions:   [place]- Preparing For Surgery  Before surgery, you can play an important role. Because skin is not sterile, your skin needs to be as free of germs as possible. You can reduce the number of germs on  your skin by washing with CHG (chlorahexidine gluconate) Soap before surgery.  CHG is an antiseptic cleaner which kills germs and bonds with the skin to continue killing germs even after washing.    Oral Hygiene is also important to reduce your risk of infection.  Remember - BRUSH YOUR TEETH THE MORNING OF SURGERY WITH YOUR REGULAR TOOTHPASTE  Please do not use if you have an allergy to CHG or antibacterial soaps. If your skin becomes reddened/irritated stop using the CHG.  Do not shave (including legs and underarms) for at least 48 hours prior to first CHG shower. It is OK to shave your face.  Please follow these instructions carefully.   Shower the NIGHT BEFORE SURGERY and the MORNING OF SURGERY with DIAL Soap.   Pat yourself dry with a CLEAN TOWEL.  Wear CLEAN PAJAMAS to bed the night before surgery  Place CLEAN SHEETS on your bed the night of your first shower and DO NOT SLEEP WITH PETS.   Day of Surgery: Please shower morning of surgery  Wear Clean/Comfortable clothing the morning of surgery Do not apply any deodorants/lotions.   Remember to brush your teeth WITH YOUR  REGULAR TOOTHPASTE.   Questions were answered. Patient verbalized understanding of instructions.

## 2024-01-19 ENCOUNTER — Ambulatory Visit (HOSPITAL_COMMUNITY)

## 2024-01-19 ENCOUNTER — Ambulatory Visit (HOSPITAL_COMMUNITY): Admitting: Anesthesiology

## 2024-01-19 ENCOUNTER — Ambulatory Visit (HOSPITAL_COMMUNITY)
Admission: EM | Admit: 2024-01-19 | Discharge: 2024-01-19 | Disposition: A | Discharge status: Home / Self Care | Attending: Emergency Medicine | Admitting: Emergency Medicine

## 2024-01-19 ENCOUNTER — Other Ambulatory Visit: Payer: Self-pay

## 2024-01-19 ENCOUNTER — Encounter (HOSPITAL_COMMUNITY): Payer: Self-pay | Admitting: Emergency Medicine

## 2024-01-19 ENCOUNTER — Encounter (HOSPITAL_COMMUNITY)
Admission: EM | Disposition: A | Discharge status: Home / Self Care | Payer: Self-pay | Source: Home / Self Care | Attending: Emergency Medicine

## 2024-01-19 DIAGNOSIS — J984 Other disorders of lung: Secondary | ICD-10-CM | POA: Diagnosis not present

## 2024-01-19 DIAGNOSIS — R918 Other nonspecific abnormal finding of lung field: Secondary | ICD-10-CM

## 2024-01-19 DIAGNOSIS — J449 Chronic obstructive pulmonary disease, unspecified: Secondary | ICD-10-CM | POA: Diagnosis not present

## 2024-01-19 DIAGNOSIS — F17210 Nicotine dependence, cigarettes, uncomplicated: Secondary | ICD-10-CM | POA: Insufficient documentation

## 2024-01-19 DIAGNOSIS — Z79899 Other long term (current) drug therapy: Secondary | ICD-10-CM | POA: Insufficient documentation

## 2024-01-19 DIAGNOSIS — Z923 Personal history of irradiation: Secondary | ICD-10-CM | POA: Insufficient documentation

## 2024-01-19 DIAGNOSIS — R590 Localized enlarged lymph nodes: Secondary | ICD-10-CM | POA: Insufficient documentation

## 2024-01-19 DIAGNOSIS — Z85118 Personal history of other malignant neoplasm of bronchus and lung: Secondary | ICD-10-CM | POA: Insufficient documentation

## 2024-01-19 DIAGNOSIS — Z08 Encounter for follow-up examination after completed treatment for malignant neoplasm: Secondary | ICD-10-CM | POA: Insufficient documentation

## 2024-01-19 DIAGNOSIS — R0902 Hypoxemia: Secondary | ICD-10-CM

## 2024-01-19 DIAGNOSIS — R911 Solitary pulmonary nodule: Secondary | ICD-10-CM | POA: Insufficient documentation

## 2024-01-19 DIAGNOSIS — C3431 Malignant neoplasm of lower lobe, right bronchus or lung: Secondary | ICD-10-CM | POA: Diagnosis not present

## 2024-01-19 DIAGNOSIS — J439 Emphysema, unspecified: Secondary | ICD-10-CM | POA: Diagnosis not present

## 2024-01-19 DIAGNOSIS — F1290 Cannabis use, unspecified, uncomplicated: Secondary | ICD-10-CM | POA: Insufficient documentation

## 2024-01-19 DIAGNOSIS — Z48813 Encounter for surgical aftercare following surgery on the respiratory system: Secondary | ICD-10-CM | POA: Diagnosis not present

## 2024-01-19 HISTORY — PX: FINE NEEDLE ASPIRATION: SHX6590

## 2024-01-19 HISTORY — PX: VIDEO BRONCHOSCOPY WITH ENDOBRONCHIAL NAVIGATION: SHX6175

## 2024-01-19 HISTORY — PX: ENDOBRONCHIAL ULTRASOUND: SHX5096

## 2024-01-19 SURGERY — VIDEO BRONCHOSCOPY WITH ENDOBRONCHIAL NAVIGATION
Anesthesia: General | Laterality: Right

## 2024-01-19 MED ORDER — DEXAMETHASONE SODIUM PHOSPHATE 10 MG/ML IJ SOLN
INTRAMUSCULAR | Status: DC | PRN
  Administered 2024-01-19: 10 mg via INTRAVENOUS

## 2024-01-19 MED ORDER — MIDAZOLAM HCL 2 MG/2ML IJ SOLN
INTRAMUSCULAR | Status: AC
  Filled 2024-01-19: qty 2

## 2024-01-19 MED ORDER — LACTATED RINGERS IV SOLN
INTRAVENOUS | Status: DC
Start: 2024-01-19 — End: 2024-01-19

## 2024-01-19 MED ORDER — CHLORHEXIDINE GLUCONATE 0.12 % MT SOLN
15.0000 mL | Freq: Once | OROMUCOSAL | Status: AC
  Administered 2024-01-19: 15 mL via OROMUCOSAL

## 2024-01-19 MED ORDER — ONDANSETRON HCL 4 MG/2ML IJ SOLN: 4.0000 mg | Freq: Once | INTRAMUSCULAR | Status: DC | PRN

## 2024-01-19 MED ORDER — OXYCODONE HCL 5 MG PO TABS: 5.0000 mg | ORAL_TABLET | Freq: Once | ORAL | Status: DC | PRN

## 2024-01-19 MED ORDER — ONDANSETRON HCL 4 MG/2ML IJ SOLN
INTRAMUSCULAR | Status: DC | PRN
  Administered 2024-01-19: 4 mg via INTRAVENOUS

## 2024-01-19 MED ORDER — CHLORHEXIDINE GLUCONATE 0.12 % MT SOLN
OROMUCOSAL | Status: AC
  Filled 2024-01-19: qty 15

## 2024-01-19 MED ORDER — ACETAMINOPHEN 10 MG/ML IV SOLN: 1000.0000 mg | Freq: Once | INTRAVENOUS | Status: DC | PRN

## 2024-01-19 MED ORDER — FENTANYL CITRATE (PF) 100 MCG/2ML IJ SOLN: 25.0000 ug | INTRAMUSCULAR | Status: DC | PRN

## 2024-01-19 MED ORDER — PHENYLEPHRINE 80 MCG/ML (10ML) SYRINGE FOR IV PUSH (FOR BLOOD PRESSURE SUPPORT)
PREFILLED_SYRINGE | INTRAVENOUS | Status: DC | PRN
  Administered 2024-01-19 (×2): 160 ug via INTRAVENOUS

## 2024-01-19 MED ORDER — PHENYLEPHRINE HCL-NACL 20-0.9 MG/250ML-% IV SOLN
INTRAVENOUS | Status: DC | PRN
  Administered 2024-01-19: 30 ug/min via INTRAVENOUS

## 2024-01-19 MED ORDER — SUGAMMADEX SODIUM 200 MG/2ML IV SOLN
INTRAVENOUS | Status: DC | PRN
  Administered 2024-01-19: 200 mg via INTRAVENOUS

## 2024-01-19 MED ORDER — PROPOFOL 10 MG/ML IV BOLUS
INTRAVENOUS | Status: DC | PRN
  Administered 2024-01-19: 90 mg via INTRAVENOUS
  Administered 2024-01-19: 50 mg via INTRAVENOUS
  Administered 2024-01-19: 150 ug/kg/min via INTRAVENOUS

## 2024-01-19 MED ORDER — MIDAZOLAM HCL 2 MG/2ML IJ SOLN
INTRAMUSCULAR | Status: DC | PRN
  Administered 2024-01-19: 2 mg via INTRAVENOUS

## 2024-01-19 MED ORDER — ROCURONIUM BROMIDE 10 MG/ML (PF) SYRINGE
PREFILLED_SYRINGE | INTRAVENOUS | Status: DC | PRN
  Administered 2024-01-19: 10 mg via INTRAVENOUS
  Administered 2024-01-19: 50 mg via INTRAVENOUS

## 2024-01-19 MED ORDER — OXYCODONE HCL 5 MG/5ML PO SOLN: 5.0000 mg | Freq: Once | ORAL | Status: DC | PRN

## 2024-01-19 MED ORDER — FENTANYL CITRATE (PF) 250 MCG/5ML IJ SOLN
INTRAMUSCULAR | Status: DC | PRN
  Administered 2024-01-19: 100 ug via INTRAVENOUS

## 2024-01-19 MED ORDER — FENTANYL CITRATE (PF) 100 MCG/2ML IJ SOLN
INTRAMUSCULAR | Status: AC
  Filled 2024-01-19: qty 2

## 2024-01-19 MED ORDER — LIDOCAINE 2% (20 MG/ML) 5 ML SYRINGE
INTRAMUSCULAR | Status: DC | PRN
  Administered 2024-01-19 (×2): 100 mg via INTRAVENOUS

## 2024-01-19 SURGICAL SUPPLY — 37 items
ADAPTER BRONCHOSCOPE OLYMPUS (ADAPTER) ×2 IMPLANT
ADAPTER VALVE BIOPSY EBUS (MISCELLANEOUS) IMPLANT
BAG COUNTER SPONGE SURGICOUNT (BAG) ×2 IMPLANT
BRUSH CYTOL CELLEBRITY 1.5X140 (MISCELLANEOUS) ×2 IMPLANT
BRUSH SUPERTRAX BIOPSY (INSTRUMENTS) IMPLANT
BRUSH SUPERTRAX NDL-TIP CYTO (INSTRUMENTS) ×2 IMPLANT
CANISTER SUCTION 3000ML PPV (SUCTIONS) ×2 IMPLANT
CNTNR URN SCR LID CUP LEK RST (MISCELLANEOUS) ×2 IMPLANT
COVER BACK TABLE 60X90IN (DRAPES) ×2 IMPLANT
FILTER STRAW FLUID ASPIR (MISCELLANEOUS) IMPLANT
FORCEPS BIOP 1.5 SINGLE USE (MISCELLANEOUS) ×2 IMPLANT
FORCEPS BIOP SUPERTRX PREMAR (INSTRUMENTS) ×2 IMPLANT
GAUZE SPONGE 4X4 12PLY STRL (GAUZE/BANDAGES/DRESSINGS) ×2 IMPLANT
GLOVE BIO SURGEON STRL SZ7.5 (GLOVE) ×4 IMPLANT
GOWN STRL REUS W/ TWL LRG LVL3 (GOWN DISPOSABLE) ×4 IMPLANT
KIT CLEAN ENDO COMPLIANCE (KITS) ×2 IMPLANT
KIT LOCATABLE GUIDE (CANNULA) IMPLANT
KIT MARKER FIDUCIAL DELIVERY (KITS) IMPLANT
KIT TURNOVER KIT B (KITS) ×2 IMPLANT
MARKER SKIN DUAL TIP RULER LAB (MISCELLANEOUS) ×2 IMPLANT
NDL SUPERTRX PREMARK BIOPSY (NEEDLE) ×2 IMPLANT
NEEDLE SUPERTRX PREMARK BIOPSY (NEEDLE) ×2 IMPLANT
NS IRRIG 1000ML POUR BTL (IV SOLUTION) ×2 IMPLANT
OIL SILICONE PENTAX (PARTS (SERVICE/REPAIRS)) ×2 IMPLANT
PAD ARMBOARD POSITIONER FOAM (MISCELLANEOUS) ×4 IMPLANT
PATCHES PATIENT (LABEL) ×6 IMPLANT
SYR 20ML ECCENTRIC (SYRINGE) ×2 IMPLANT
SYR 20ML LL LF (SYRINGE) ×2 IMPLANT
SYR 50ML SLIP (SYRINGE) ×2 IMPLANT
TOWEL GREEN STERILE FF (TOWEL DISPOSABLE) ×2 IMPLANT
TRAP SPECIMEN MUCUS 40CC (MISCELLANEOUS) IMPLANT
TUBE CONNECTING 20X1/4 (TUBING) ×2 IMPLANT
UNDERPAD 30X36 HEAVY ABSORB (UNDERPADS AND DIAPERS) ×2 IMPLANT
VALVE BIOPSY SINGLE USE (MISCELLANEOUS) ×2 IMPLANT
VALVE SUCTION BRONCHIO DISP (MISCELLANEOUS) ×2 IMPLANT
WATER STERILE IRR 1000ML POUR (IV SOLUTION) ×2 IMPLANT
superlock fiducial marker IMPLANT

## 2024-01-19 NOTE — H&P (Signed)
 Marcus Pace is an 68 y.o. male.   Chief Complaint: Right upper lobe pulmonary nodule, mediastinal adenopathy HPI: Marcus Pace is a 42, history of tobacco use and small segmental PE in the past.  Diagnosed with right lower lobe non-small cell lung cancer by bronchoscopy done 12/2022.  He was treated with SBRT.  His surveillance imaging has shown a new right upper lobe pulmonary nodule, scarring in the treated areas in the right lower lobe, some slightly enlarged mediastinal nodes.  Recommendation made to achieve a tissue diagnosis via robotic assisted navigational bronchoscopy and endobronchial ultrasound.  He states that he is been feeling well.  No dyspnea, no chest pain, no cough.  He is not currently on anticoagulation.  Past Medical History:  Diagnosis Date   Arthritis    Bacteremia    2016 or so with hospitalization   Emphysema of lung (HCC) 07/20/2019   History of radiation therapy    Right Lung, 01/29/2023 - 02/03/2023 Dr. Shannon   Inguinal hernia recurrent unilateral    herniation of mesentary and loops of bowel to right scrotal sac, 4.5 cm defect   Lesion of right lung 07/20/2019   Lumbar radiculopathy 02/15/2021   Pulmonary emboli (HCC) 07/20/2019    Past Surgical History:  Procedure Laterality Date   BRONCHIAL BIOPSY  12/16/2022   Procedure: BRONCHIAL BIOPSIES;  Surgeon: Shelah Marcus RAMAN, MD;  Location: [facility] ENDOSCOPY;  Service: Pulmonary;;   BRONCHIAL BRUSHINGS  12/16/2022   Procedure: BRONCHIAL BRUSHINGS;  Surgeon: Shelah Marcus RAMAN, MD;  Location: [facility] ENDOSCOPY;  Service: Pulmonary;;   BRONCHIAL NEEDLE ASPIRATION BIOPSY  12/16/2022   Procedure: BRONCHIAL NEEDLE ASPIRATION BIOPSIES;  Surgeon: Shelah Marcus RAMAN, MD;  Location: [facility] ENDOSCOPY;  Service: Pulmonary;;   FIDUCIAL MARKER PLACEMENT  12/16/2022   Procedure: FIDUCIAL MARKER PLACEMENT;  Surgeon: Shelah Marcus RAMAN, MD;  Location: [facility] ENDOSCOPY;  Service: Pulmonary;;    Family History  Problem Relation Age of Onset   Cancer  Mother    Heart disease Father    Stomach cancer Maternal Aunt    Colon cancer Neg Hx    Colon polyps Neg Hx    Esophageal cancer Neg Hx    Rectal cancer Neg Hx    Social History:  reports that he has been smoking cigarettes. He started smoking about 55 years ago. He has a 55.7 pack-year smoking history. He has never used smokeless tobacco. He reports current alcohol use of about 2.0 standard drinks of alcohol per week. He reports current drug use. Frequency: 3.00 times per week. Drug: Marijuana.  Allergies: No Known Allergies  Medications Prior to Admission  Medication Sig Dispense Refill   albuterol  (VENTOLIN  HFA) 108 (90 Base) MCG/ACT inhaler Inhale 2 puffs into the lungs every 4 (four) hours as needed for wheezing or shortness of breath. 8.5 g 6   Multiple Vitamin (MULTIVITAMIN WITH MINERALS) TABS tablet Take 1 tablet by mouth daily. 30 tablet 0   traZODone  (DESYREL ) 50 MG tablet Take 1 tablet (50 mg total) by mouth at bedtime as needed for sleep. 30 tablet 6   acetaminophen  (TYLENOL ) 500 MG tablet Take 1,000 mg by mouth every 8 (eight) hours as needed for moderate pain (pain score 4-6).     buPROPion  (WELLBUTRIN  XL) 150 MG 24 hr tablet Take 1 tablet (150 mg total) by mouth daily. For smoking cessation (Patient not taking: Reported on 08/11/2023) 30 tablet 6   Fluticasone -Umeclidin-Vilant (TRELEGY ELLIPTA ) 100-62.5-25 MCG/ACT AEPB Inhale 1 puff into the lungs daily. (Patient not  taking: Reported on 08/11/2023) 60 each 6   melatonin 3 MG TABS tablet Take 1 tablet (3 mg total) by mouth at bedtime. (Patient not taking: Reported on 07/11/2023) 30 tablet 3   naproxen sodium (ALEVE) 220 MG tablet Take 220 mg by mouth.     nicotine  (NICODERM CQ  - DOSED IN MG/24 HOURS) 14 mg/24hr patch Place 1 patch (14 mg total) onto the skin daily. Apply 14 mg patch daily x 6 wk, then 7 mg patch daily x 2 wk (Patient not taking: Reported on 08/11/2023) 14 patch 0   nicotine  (NICODERM CQ  - DOSED IN MG/24 HR) 7 mg/24hr  patch Place 1 patch (7 mg total) onto the skin daily. Apply 14 mg patch daily x 6 wk, then 7 mg patch daily x 2 wk (Patient not taking: Reported on 08/11/2023) 14 patch 0   tamsulosin  (FLOMAX ) 0.4 MG CAPS capsule Take 1 capsule (0.4 mg total) by mouth daily. (Patient not taking: Reported on 08/11/2023) 30 capsule 6   thiamine  100 MG tablet Take 1 tablet (100 mg total) by mouth daily. (Patient not taking: Reported on 08/11/2023) 30 tablet 0    No results found for this or any previous visit (from the past 48 hours). No results found.  Review of Systems As per HPI Blood pressure (!) 145/88, pulse 75, temperature 98.1 F (36.7 C), temperature source Oral, resp. rate 17, height 6' (1.829 m), weight 61.2 kg, SpO2 93%. Physical Exam  Gen: Pleasant, thin, in no distress,  normal affect  ENT: No lesions,  mouth clear,  oropharynx clear, no postnasal drip  Neck: No JVD, no stridor  Lungs: No use of accessory muscles, no crackles or wheezing on normal respiration, no wheeze on forced expiration  Cardiovascular: RRR, heart sounds normal, no murmur or gallops, no peripheral edema  Abdomen: soft and NT, no HSM,  BS normal  Musculoskeletal: No deformities, no cyanosis or clubbing  Neuro: alert, awake, non focal  Skin: Warm, no lesions or rash    Assessment/Plan History of stage IIb adenocarcinoma of the right lower lobe, now with a new right upper lobe nodule and mediastinal adenopathy, sixth rib sclerotic lesion.  Recommendation made to achieve a tissue diagnosis, get molecular samples via robotic assisted navigational bronchoscopy and endobronchial ultrasound.  Patient understands the risks, benefits, rationale of the procedure.  No barriers identified.  Marcus GORMAN Chris, MD 01/19/2024, 7:38 AM

## 2024-01-19 NOTE — Discharge Instructions (Signed)

## 2024-01-19 NOTE — Op Note (Signed)
 Video Bronchoscopy with Endobronchial Ultrasound and Electromagnetic Navigation Procedure Note  Date of Operation: 01/19/2024  Pre-op Diagnosis: Right upper lobe nodule, mediastinal adenopathy  Post-op Diagnosis: Same  Surgeon: LAMAR CHRIS  Assistants: None  Anesthesia: General endotracheal anesthesia  Operation: Flexible video fiberoptic bronchoscopy with endobronchial ultrasound, robotic assisted navigation and biopsies.  Estimated Blood Loss: Minimal  Complications: None apparent  Indications and History: Marcus Pace is a 68 y.o. 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  Found to have a new right upper lobe bilobed pulmonary nodule on surveillance imaging as well as scant mediastinal adenopathy.  Recommendation was made to achieve a tissue diagnosis using endobronchial ultrasound and robotic assisted navigational bronchoscopy. The risks, benefits, complications, treatment options and expected outcomes were discussed with the patient.  The possibilities of pneumothorax, pneumonia, reaction to medication, pulmonary aspiration, perforation of a viscus, bleeding, failure to diagnose a condition and creating a complication requiring transfusion or operation were discussed with the patient who freely signed the consent.    Description of Procedure: The patient was seen in the Preoperative Area, was examined and was deemed appropriate to proceed.  The patient was taken to [facility] Endoscopy room 3, identified as Marcus Pace and the procedure verified as Flexible Video Fiberoptic Bronchoscopy with robotic assisted navigation and endobronchial ultrasound.  A Time Out was held and the above information confirmed.   Robotic assisted navigation: Prior to the date of the procedure a high-resolution CT scan of the chest was performed. Utilizing ION software program a virtual tracheobronchial tree was generated to allow the creation of distinct navigation  pathways to the patient's parenchymal abnormalities. After being taken to the operating room general anesthesia was initiated and the patient  was orally intubated. The video fiberoptic bronchoscope was introduced via the endotracheal tube and a general inspection was performed which showed normal right and left lung anatomy. Aspiration of the bilateral mainstems was completed to remove any remaining secretions. Robotic catheter inserted into patient's endotracheal tube.   Target #1 right upper lobe nodule: The distinct navigation pathways prepared prior to this procedure were then utilized to navigate to patient's lesion identified on CT scan. The robotic catheter was secured into place and the vision probe was withdrawn.  Lesion location was approximated using fluoroscopy.  Local registration and targeting was performed using [employer] Cios mobile C-arm three-dimensional imaging. Under fluoroscopic guidance transbronchial needle brushings, transbronchial needle biopsies, and transbronchial forceps biopsies were performed to be sent for cytology and pathology.  Needle-in-lesion was confirmed using Cios mobile C-arm.   Under fluoroscopic guidance a single fiducial marker was placed adjacent to the nodule.   Endobronchial ultrasound: The robotic scope was then withdrawn and the endobronchial ultrasound was used to identify and characterize the peritracheal, hilar and bronchial lymph nodes. Inspection showed mild enlargement at station 7.  No other abnormal nodes were seen. Using real-time ultrasound guidance Wang needle biopsies were take from Station 7 node and were sent for cytology.   At the end of the procedure a general airway inspection was performed and there was no evidence of active bleeding. The bronchoscope was removed.  The patient tolerated the procedure well. There was no significant blood loss and there were no obvious complications. A post-procedural chest x-ray is  pending.  Samples Target #1: 1. Transbronchial needle brushings from right upper lobe nodule 2. Transbronchial Wang needle biopsies from right upper lobe nodule 3. Transbronchial forceps biopsies from right upper lobe nodule  EBUS Samples: 1. Wang needle biopsies from 7 node    Lamar Chris, MD, PhD 01/19/2024, 10:16 AM [facility]

## 2024-01-19 NOTE — Transfer of Care (Signed)
 Immediate Anesthesia Transfer of Care Note  Patient: Marcus Pace  Procedure(s) Performed: VIDEO BRONCHOSCOPY WITH ENDOBRONCHIAL NAVIGATION (Right) ENDOBRONCHIAL ULTRASOUND (EBUS) FINE NEEDLE ASPIRATION  Patient Location: PACU  Anesthesia Type:General  Level of Consciousness: drowsy  Airway & Oxygen  Therapy: Patient Spontanous Breathing and Patient connected to face mask oxygen   Post-op Assessment: Report given to RN and Post -op Vital signs reviewed and stable  Post vital signs: Reviewed and stable  Last Vitals:  Vitals Value Taken Time  BP 87/61 01/19/24 11:00  Temp 37 C 01/19/24 10:45  Pulse 100 01/19/24 11:01  Resp 23 01/19/24 11:01  SpO2 87 % 01/19/24 11:01  Vitals shown include unfiled device data.  Last Pain:  Vitals:   01/19/24 0707  TempSrc:   PainSc: 0-No pain      Patients Stated Pain Goal: 0 (01/19/24 0707)  Complications: No notable events documented.

## 2024-01-19 NOTE — Anesthesia Postprocedure Evaluation (Signed)
 Anesthesia Post Note  Patient: LISANDRO MEGGETT  Procedure(s) Performed: VIDEO BRONCHOSCOPY WITH ENDOBRONCHIAL NAVIGATION (Right) ENDOBRONCHIAL ULTRASOUND (EBUS) FINE NEEDLE ASPIRATION     Patient location during evaluation: PACU Anesthesia Type: General Level of consciousness: awake and alert Pain management: pain level controlled Vital Signs Assessment: post-procedure vital signs reviewed and stable Respiratory status: spontaneous breathing, nonlabored ventilation, respiratory function stable and patient connected to nasal cannula oxygen  Cardiovascular status: blood pressure returned to baseline and stable Postop Assessment: no apparent nausea or vomiting Anesthetic complications: yes   Encounter Notable Events  Notable Event Outcome Phase Comment  Laryngospasm Resolved in Lab Intraprocedure see ACR    Last Vitals:  Vitals:   01/19/24 1212 01/19/24 1215  BP:  110/84  Pulse: (!) 158 87  Resp: 10 12  Temp:  36.7 C  SpO2: 94% 100%    Last Pain:  Vitals:   01/19/24 1215  TempSrc:   PainSc: 0-No pain                 Lynwood MARLA Cornea

## 2024-01-19 NOTE — Anesthesia Procedure Notes (Signed)
 Procedure Name: Intubation Date/Time: 01/19/2024 9:17 AM  Performed by: Hedy Jarred, CRNAPre-anesthesia Checklist: Patient identified, Emergency Drugs available, Suction available and Patient being monitored Patient Re-evaluated:Patient Re-evaluated prior to induction Oxygen  Delivery Method: Circle System Utilized Preoxygenation: Pre-oxygenation with 100% oxygen  Induction Type: IV induction Ventilation: Mask ventilation without difficulty Laryngoscope Size: Mac and 4 Tube type: Oral Tube size: 8.5 mm Number of attempts: 1 Airway Equipment and Method: Stylet and Oral airway Placement Confirmation: ETT inserted through vocal cords under direct vision, positive ETCO2 and breath sounds checked- equal and bilateral Secured at: 23 cm Tube secured with: Tape Dental Injury: Teeth and Oropharynx as per pre-operative assessment

## 2024-01-19 NOTE — Op Note (Signed)
 Procedure Note  Patient: Marcus Pace  [employer] Cios mobile C-arm was utilized to identify and biopsy right upper lobe nodule.  Needle-in-lesion was confirmed using real-time Cios imaging, and images were uploaded to PACS.      Lamar Chris, MD, PhD 01/19/2024, 10:22 AM [place] [facility] [phone] or if no answer before 7:00PM call [phone] For any issues after 7:00PM please call eLink [phone]

## 2024-01-19 NOTE — Anesthesia Preprocedure Evaluation (Signed)
 Anesthesia Evaluation  Patient identified by MRN, date of birth, ID band Patient awake    Reviewed: Allergy & Precautions, NPO status , Patient's Chart, lab work & pertinent test results, reviewed documented beta blocker date and time   History of Anesthesia Complications Negative for: history of anesthetic complications  Airway Mallampati: II  TM Distance: >3 FB     Dental no notable dental hx.    Pulmonary neg shortness of breath, COPD,  COPD inhaler, Current Smoker and Patient abstained from smoking.   breath sounds clear to auscultation       Cardiovascular (-) angina (-) CAD, (-) Past MI and (-) Cardiac Stents  Rhythm:Regular Rate:Normal     Neuro/Psych neg Seizures  Neuromuscular disease    GI/Hepatic ,neg GERD  ,,(+) neg Cirrhosis        Endo/Other    Renal/GU Renal disease     Musculoskeletal  (+) Arthritis ,    Abdominal   Peds  Hematology   Anesthesia Other Findings   Reproductive/Obstetrics                              Anesthesia Physical Anesthesia Plan  ASA: 3  Anesthesia Plan: General   Post-op Pain Management:    Induction: Intravenous  PONV Risk Score and Plan: 2 and Ondansetron   Airway Management Planned: Oral ETT  Additional Equipment:   Intra-op Plan:   Post-operative Plan: Extubation in OR  Informed Consent: I have reviewed the patients History and Physical, chart, labs and discussed the procedure including the risks, benefits and alternatives for the proposed anesthesia with the patient or authorized representative who has indicated his/her understanding and acceptance.     Dental advisory given  Plan Discussed with: CRNA  Anesthesia Plan Comments:         Anesthesia Quick Evaluation

## 2024-01-20 ENCOUNTER — Encounter (HOSPITAL_COMMUNITY): Payer: Self-pay | Admitting: Emergency Medicine

## 2024-01-21 LAB — CYTOLOGY - NON PAP

## 2024-01-28 NOTE — Progress Notes (Unsigned)
 [facility] OFFICE PROGRESS NOTE  Marcus Clam, MD [address] [place] [address]  DIAGNOSIS: Initially diagnosed as Stage IIB (T3, N0, M0) non-small cell lung cancer, adenocarcinoma. He presented with 2 pulmonary nodules in the right lower lobe. Concern for progression in August 2025 with pulmonary metastasis in the RUL, right hilar adenopathy, and left 6th rib sclerotic lesion.    PDL1 expression 20% Foundation One: No actionable mutations   PRIOR THERAPY:  SBRT to the 2 pulmonary nodules in the right lung under the care of Dr. Shannon. He is scheduled to start on 01/20/2023.   CURRENT THERAPY: ***Radiation versus systemic treatment?  Palliative systemic chemotherapy with carboplatin for an AUC of 5, Alimta 500 mg/m, and Keytruda 20 mg IV every 3 weeks.  First dose on 02/09/2024.  INTERVAL HISTORY: Marcus Pace 68 y.o. male returns to clinic today for follow-up visit.  The patient was last seen in the clinic by Dr. Sherrod and myself on 01/08/24.  In summary the patient has a history of stage II lung cancer for which he underwent SBRT in September 2024.  He was on observation with routine surveillance CTs.  His restaging CT scan showed increase size of the 2 adjacent pulmonary nodules in the right upper lobe which were nonspecific and some subcarinal lymph node that was prominent.  Therefore Dr. Sherrod recommended a PET scan to further assess.  He then underwent bronchoscopy and biopsy on 01/19/2024 and the final pathology of the upper lobe FNA showed adenocarcinoma.  The patient's case was discussed at the multidisciplinary conference last week. The plan is to ***?  Otherwise, the patient denies any major changes in his health. He continues to experience shortness of breath intermittently due to his COPD which he states is stable. He does cough on and off for which he will use halls. He tends to notice the cough more when he gets strangled on  something he is drinking. No chest pain or hemoptysis. He denies fevers, chills, or night sweats.He denies nausea, vomiting, diarrhea, or constipation. She denies headaches or vision changes. He is accompanied by his sister today. He is here for evaluation and a more detailed discussion about his current condition and treatment options.    MEDICAL HISTORY: Past Medical History:  Diagnosis Date   Arthritis    Bacteremia    2016 or so with hospitalization   Emphysema of lung (HCC) 07/20/2019   History of radiation therapy    Right Lung, 01/29/2023 - 02/03/2023 Dr. Shannon   Inguinal hernia recurrent unilateral    herniation of mesentary and loops of bowel to right scrotal sac, 4.5 cm defect   Lesion of right lung 07/20/2019   Lumbar radiculopathy 02/15/2021   Pulmonary emboli (HCC) 07/20/2019    ALLERGIES:  has no known allergies.  MEDICATIONS:  Current Outpatient Medications  Medication Sig Dispense Refill   acetaminophen  (TYLENOL ) 500 MG tablet Take 1,000 mg by mouth every 8 (eight) hours as needed for moderate pain (pain score 4-6).     albuterol  (VENTOLIN  HFA) 108 (90 Base) MCG/ACT inhaler Inhale 2 puffs into the lungs every 4 (four) hours as needed for wheezing or shortness of breath. 8.5 g 6   Multiple Vitamin (MULTIVITAMIN WITH MINERALS) TABS tablet Take 1 tablet by mouth daily. 30 tablet 0   naproxen sodium (ALEVE) 220 MG tablet Take 220 mg by mouth.     thiamine  100 MG tablet Take 1 tablet (100 mg total) by mouth  daily. (Patient not taking: Reported on 08/11/2023) 30 tablet 0   traZODone  (DESYREL ) 50 MG tablet Take 1 tablet (50 mg total) by mouth at bedtime as needed for sleep. 30 tablet 6   No current facility-administered medications for this visit.    SURGICAL HISTORY:  Past Surgical History:  Procedure Laterality Date   BRONCHIAL BIOPSY  12/16/2022   Procedure: BRONCHIAL BIOPSIES;  Surgeon: Shelah Lamar RAMAN, MD;  Location: [facility] ENDOSCOPY;  Service: Pulmonary;;   BRONCHIAL  BRUSHINGS  12/16/2022   Procedure: BRONCHIAL BRUSHINGS;  Surgeon: Shelah Lamar RAMAN, MD;  Location: [facility] ENDOSCOPY;  Service: Pulmonary;;   BRONCHIAL NEEDLE ASPIRATION BIOPSY  12/16/2022   Procedure: BRONCHIAL NEEDLE ASPIRATION BIOPSIES;  Surgeon: Shelah Lamar RAMAN, MD;  Location: [facility] ENDOSCOPY;  Service: Pulmonary;;   ENDOBRONCHIAL ULTRASOUND  01/19/2024   Procedure: ENDOBRONCHIAL ULTRASOUND (EBUS);  Surgeon: Shelah Lamar RAMAN, MD;  Location: [facility] ENDOSCOPY;  Service: Pulmonary;;   FIDUCIAL MARKER PLACEMENT  12/16/2022   Procedure: FIDUCIAL MARKER PLACEMENT;  Surgeon: Shelah Lamar RAMAN, MD;  Location: [facility] ENDOSCOPY;  Service: Pulmonary;;   FINE NEEDLE ASPIRATION  01/19/2024   Procedure: FINE NEEDLE ASPIRATION;  Surgeon: Shelah Lamar RAMAN, MD;  Location: [facility] ENDOSCOPY;  Service: Pulmonary;;   VIDEO BRONCHOSCOPY WITH ENDOBRONCHIAL NAVIGATION Right 01/19/2024   Procedure: VIDEO BRONCHOSCOPY WITH ENDOBRONCHIAL NAVIGATION;  Surgeon: Shelah Lamar RAMAN, MD;  Location: [facility] ENDOSCOPY;  Service: Pulmonary;  Laterality: Right;  Need EBUS scope also    REVIEW OF SYSTEMS:   Review of Systems  Constitutional: Negative for appetite change, chills, fatigue, fever and unexpected weight change.  HENT:   Negative for mouth sores, nosebleeds, sore throat and trouble swallowing.   Eyes: Negative for eye problems and icterus.  Respiratory: Negative for cough, hemoptysis, shortness of breath and wheezing.   Cardiovascular: Negative for chest pain and leg swelling.  Gastrointestinal: Negative for abdominal pain, constipation, diarrhea, nausea and vomiting.  Genitourinary: Negative for bladder incontinence, difficulty urinating, dysuria, frequency and hematuria.   Musculoskeletal: Negative for back pain, gait problem, neck pain and neck stiffness.  Skin: Negative for itching and rash.  Neurological: Negative for dizziness, extremity weakness, gait problem, headaches, light-headedness and seizures.  Hematological: Negative for  adenopathy. Does not bruise/bleed easily.  Psychiatric/Behavioral: Negative for confusion, depression and sleep disturbance. The patient is not nervous/anxious.     PHYSICAL EXAMINATION:  There were no vitals taken for this visit.  ECOG PERFORMANCE STATUS: {CHL ONC ECOG [id number]  Physical Exam  Constitutional: Oriented to person, place, and time and well-developed, well-nourished, and in no distress. No distress.  HENT:  Head: Normocephalic and atraumatic.  Mouth/Throat: Oropharynx is clear and moist. No oropharyngeal exudate.  Eyes: Conjunctivae are normal. Right eye exhibits no discharge. Left eye exhibits no discharge. No scleral icterus.  Neck: Normal range of motion. Neck supple.  Cardiovascular: Normal rate, regular rhythm, normal heart sounds and intact distal pulses.   Pulmonary/Chest: Effort normal and breath sounds normal. No respiratory distress. No wheezes. No rales.  Abdominal: Soft. Bowel sounds are normal. Exhibits no distension and no mass. There is no tenderness.  Musculoskeletal: Normal range of motion. Exhibits no edema.  Lymphadenopathy:    No cervical adenopathy.  Neurological: Alert and oriented to person, place, and time. Exhibits normal muscle tone. Gait normal. Coordination normal.  Skin: Skin is warm and dry. No rash noted. Not diaphoretic. No erythema. No pallor.  Psychiatric: Mood, memory and judgment normal.  Vitals reviewed.  LABORATORY DATA: Lab Results  Component Value Date  WBC 7.5 01/08/2024   HGB 16.8 01/08/2024   HCT 49.1 01/08/2024   MCV 83.8 01/08/2024   PLT 254 01/08/2024      Chemistry      Component Value Date/Time   NA 141 01/08/2024 1457   NA 142 12/05/2021 1610   K 4.1 01/08/2024 1457   CL 105 01/08/2024 1457   CO2 31 01/08/2024 1457   BUN 10 01/08/2024 1457   BUN 10 12/05/2021 1610   CREATININE 0.86 01/08/2024 1457      Component Value Date/Time   CALCIUM 10.1 01/08/2024 1457   ALKPHOS 104 01/08/2024 1457   AST 23  01/08/2024 1457   ALT 16 01/08/2024 1457   BILITOT 1.0 01/08/2024 1457       RADIOGRAPHIC STUDIES:  DG Chest Port 1 View Result Date: 01/19/2024 CLINICAL DATA:  Status post bronchoscopy and biopsy EXAM: PORTABLE CHEST 1 VIEW COMPARISON:  Chest radiograph dated 12/16/2022, CT chest dated 11/24/2023 FINDINGS: Interval placement of additional fiducial in the mid right lung. Unchanged fiducials in the medial and basilar right lower lung. Similar volume loss and architectural distortion involving the right lung base and left lateral mid lung. Bilateral pleural effusions, better evaluated on prior CT. No pneumothorax. The heart size and mediastinal contours are within normal limits. No acute osseous abnormality. IMPRESSION: 1. Interval placement of additional fiducial in the mid right lung. No pneumothorax. 2. Similar volume loss and architectural distortion involving the right lung base and left lateral mid lung. Electronically Signed   By: Limin  Xu M.D.   On: 01/19/2024 11:07   DG C-ARM BRONCHOSCOPY Result Date: 01/19/2024 C-ARM BRONCHOSCOPY: Fluoroscopy was utilized by the requesting physician.  No radiographic interpretation.     ASSESSMENT/PLAN:  This is a very pleasant 68 year old African-American male who was initially diagnosed with Stage IIB (T3, N0, M0) non-small cell lung cancer, adenocarcinoma. He presented with 2 pulmonary nodules in the right lower lobe status post SBRT to the right lung nodule in September 2024.  He is currently on observation.He  was found to have new right upper lobe nodule and right hilar uptake and left sixth lesion in August 2025.    His PDL1 expression is 2% and he has no actionable mutations.     The patient underwent repeat bronchoscopy and the pathology of the right upper lobe nodule was consistency with adenocarcinoma.    Dr. Sherrod had a lengthly discussion with the patient today about her current condition and treatment options. The patient was given  the option of a referral to hospice/palliative vs. Treatment with systemic chemotherapy with carboplatin for an AUC of 5, Alimta 500 mg/m, and Keytruda 200 mg IV every 3 weeks.  The patient is interested in proceeding with systemic chemotherapy.  She is expected to start her first dose of this treatment on __.  We discussed the adverse side effects of treatment including but not limited to alopecia, myelosuppression, nausea and vomiting, peripheral neuropathy, liver or renal dysfunction as well as immunotherapy mediated adverse effects.   I will arrange for the patient to have a chemoeducation class prior to receiving her first cycle of chemotherapy.   We will arrange for the patient to have a B12 injection while in the clinic today.     I sent prescriptions for 1 mg folic acid p.o. daily as well as Compazine 10 mg every 6 hours as needed for nausea.   The patient will follow-up in 2 weeks for a one-week follow-up visit after completing  her first cycle of chemotherapy.  The patient was advised to call immediately if he has any concerning symptoms in the interval. The patient voices understanding of current disease status and treatment options and is in agreement with the current care plan. All questions were answered. The patient knows to call the clinic with any problems, questions or concerns. We can certainly see the patient much sooner if necessary    No orders of the defined types were placed in this encounter.    I spent {CHL ONC TIME VISIT - DTPQU:[id number]} counseling the patient face to face. The total time spent in the appointment was {CHL ONC TIME VISIT - DTPQU:[id number]}.  Yanessa Hocevar L Gizzelle Lacomb, PA-C 01/28/24

## 2024-01-29 ENCOUNTER — Other Ambulatory Visit: Payer: Self-pay

## 2024-01-31 NOTE — Progress Notes (Signed)
 The proposed treatment discussed in conference is for discussion purpose only and is not a binding recommendation.  The patients have not been physically examined, or presented with their treatment options.  Therefore, final treatment plans cannot be decided.

## 2024-02-02 ENCOUNTER — Inpatient Hospital Stay

## 2024-02-02 ENCOUNTER — Inpatient Hospital Stay: Admitting: Physician Assistant

## 2024-02-02 VITALS — BP 140/95 | HR 88 | Temp 97.2°F | Resp 17 | Ht 72.0 in | Wt 131.0 lb

## 2024-02-02 DIAGNOSIS — J449 Chronic obstructive pulmonary disease, unspecified: Secondary | ICD-10-CM | POA: Diagnosis not present

## 2024-02-02 DIAGNOSIS — C3490 Malignant neoplasm of unspecified part of unspecified bronchus or lung: Secondary | ICD-10-CM

## 2024-02-02 DIAGNOSIS — C3431 Malignant neoplasm of lower lobe, right bronchus or lung: Secondary | ICD-10-CM | POA: Diagnosis not present

## 2024-02-02 DIAGNOSIS — R911 Solitary pulmonary nodule: Secondary | ICD-10-CM | POA: Diagnosis not present

## 2024-02-02 DIAGNOSIS — Z79899 Other long term (current) drug therapy: Secondary | ICD-10-CM | POA: Diagnosis not present

## 2024-02-02 DIAGNOSIS — R599 Enlarged lymph nodes, unspecified: Secondary | ICD-10-CM | POA: Diagnosis not present

## 2024-02-02 DIAGNOSIS — F17210 Nicotine dependence, cigarettes, uncomplicated: Secondary | ICD-10-CM | POA: Diagnosis not present

## 2024-02-02 DIAGNOSIS — Z923 Personal history of irradiation: Secondary | ICD-10-CM | POA: Diagnosis not present

## 2024-02-02 DIAGNOSIS — M899 Disorder of bone, unspecified: Secondary | ICD-10-CM | POA: Diagnosis not present

## 2024-02-02 LAB — CBC WITH DIFFERENTIAL (CANCER CENTER ONLY)
Abs Immature Granulocytes: 0.02 K/uL (ref 0.00–0.07)
Basophils Absolute: 0 K/uL (ref 0.0–0.1)
Basophils Relative: 1 %
Eosinophils Absolute: 0.2 K/uL (ref 0.0–0.5)
Eosinophils Relative: 2 %
HCT: 53.2 % — ABNORMAL HIGH (ref 39.0–52.0)
Hemoglobin: 18.1 g/dL — ABNORMAL HIGH (ref 13.0–17.0)
Immature Granulocytes: 0 %
Lymphocytes Relative: 13 %
Lymphs Abs: 0.9 K/uL (ref 0.7–4.0)
MCH: 29 pg (ref 26.0–34.0)
MCHC: 34 g/dL (ref 30.0–36.0)
MCV: 85.1 fL (ref 80.0–100.0)
Monocytes Absolute: 0.7 K/uL (ref 0.1–1.0)
Monocytes Relative: 10 %
Neutro Abs: 5.2 K/uL (ref 1.7–7.7)
Neutrophils Relative %: 74 %
Platelet Count: 217 K/uL (ref 150–400)
RBC: 6.25 MIL/uL — ABNORMAL HIGH (ref 4.22–5.81)
RDW: 15.8 % — ABNORMAL HIGH (ref 11.5–15.5)
WBC Count: 7 K/uL (ref 4.0–10.5)
nRBC: 0 % (ref 0.0–0.2)

## 2024-02-02 LAB — CMP (CANCER CENTER ONLY)
ALT: 17 U/L (ref 0–44)
AST: 24 U/L (ref 15–41)
Albumin: 4.3 g/dL (ref 3.5–5.0)
Alkaline Phosphatase: 111 U/L (ref 38–126)
Anion gap: 6 (ref 5–15)
BUN: 10 mg/dL (ref 8–23)
CO2: 29 mmol/L (ref 22–32)
Calcium: 9.5 mg/dL (ref 8.9–10.3)
Chloride: 103 mmol/L (ref 98–111)
Creatinine: 0.94 mg/dL (ref 0.61–1.24)
GFR, Estimated: >60 mL/min (ref >60)
Glucose, Bld: 71 mg/dL (ref 70–99)
Potassium: 4.4 mmol/L (ref 3.5–5.1)
Sodium: 138 mmol/L (ref 135–145)
Total Bilirubin: 0.9 mg/dL (ref 0.0–1.2)
Total Protein: 7.9 g/dL (ref 6.5–8.1)

## 2024-02-04 NOTE — Progress Notes (Signed)
 Location of tumor and Histology per Pathology Report: ***  Biopsy:    Past/Anticipated interventions by surgeon/ , if any: right    Past/Anticipated interventions by medical oncology, if any:    Pain issues, if any:  {:18581} {PAIN DESCRIPTION:[id number]}  SAFETY ISSUES: Prior radiation? Yes,   Pacemaker/ICD? {:18581}++

## 2024-02-05 ENCOUNTER — Ambulatory Visit
Admission: RE | Admit: 2024-02-05 | Discharge: 2024-02-05 | Disposition: A | Discharge status: Home / Self Care | Source: Ambulatory Visit | Attending: Radiation Oncology | Admitting: Radiation Oncology

## 2024-02-05 ENCOUNTER — Encounter: Payer: Self-pay | Admitting: Radiation Oncology

## 2024-02-05 DIAGNOSIS — Z800 Family history of malignant neoplasm of digestive organs: Secondary | ICD-10-CM | POA: Diagnosis not present

## 2024-02-05 DIAGNOSIS — Z923 Personal history of irradiation: Secondary | ICD-10-CM | POA: Diagnosis not present

## 2024-02-05 DIAGNOSIS — C3431 Malignant neoplasm of lower lobe, right bronchus or lung: Secondary | ICD-10-CM

## 2024-02-05 DIAGNOSIS — K449 Diaphragmatic hernia without obstruction or gangrene: Secondary | ICD-10-CM | POA: Diagnosis not present

## 2024-02-05 DIAGNOSIS — J90 Pleural effusion, not elsewhere classified: Secondary | ICD-10-CM | POA: Diagnosis not present

## 2024-02-05 DIAGNOSIS — M1990 Unspecified osteoarthritis, unspecified site: Secondary | ICD-10-CM | POA: Insufficient documentation

## 2024-02-05 DIAGNOSIS — F17210 Nicotine dependence, cigarettes, uncomplicated: Secondary | ICD-10-CM | POA: Insufficient documentation

## 2024-02-05 DIAGNOSIS — C7951 Secondary malignant neoplasm of bone: Secondary | ICD-10-CM | POA: Insufficient documentation

## 2024-02-05 DIAGNOSIS — Z86711 Personal history of pulmonary embolism: Secondary | ICD-10-CM | POA: Insufficient documentation

## 2024-02-05 DIAGNOSIS — C7801 Secondary malignant neoplasm of right lung: Secondary | ICD-10-CM | POA: Diagnosis not present

## 2024-02-05 DIAGNOSIS — Z809 Family history of malignant neoplasm, unspecified: Secondary | ICD-10-CM | POA: Insufficient documentation

## 2024-02-05 DIAGNOSIS — J439 Emphysema, unspecified: Secondary | ICD-10-CM | POA: Insufficient documentation

## 2024-02-05 DIAGNOSIS — Z79899 Other long term (current) drug therapy: Secondary | ICD-10-CM | POA: Insufficient documentation

## 2024-02-05 NOTE — Progress Notes (Signed)
 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  Stage IIB (T3, N0, M0) non-small cell lung cancer, adenocarcinoma. He presented with 2 pulmonary nodules in the right lower lobe; s/p SBRT completed in September 2024, now presenting with pulmonary metastases in the RUL and left 6th rib sclerotic lesion.   HISTORY OF PRESENT ILLNESS::Marcus Pace is a 68 y.o. male who is accompanied by his supportive sister. he is seen as a courtesy of Dr. Sherrod for an opinion concerning radiation therapy as part of management for his recurrent lung cancer. He is well-known to us  for his history of SBRT for lung cancer and was last seen in office on 03/06/23 for a follow up visit.    Most recent CT chest scan showed increase size of the 2 adjacent pulmonary nodules in the right upper lobe which were nonspecific and some subcarinal lymph node that was prominent. Subsequent PET scan on 12/19/23 showed interval increase in size and metabolic activity of a right upper lobe now bilobed nodule measuring 1.3 x 0.7 cm with max SUV 6.7, previously 0.8 x 0.5 cm max SUV 1.9. scan also noted a right hilar FDG uptake, possibly subcentimeter hypermetabolic lymph node, metastatic versus reactive along with a left sixth rib sclerotic lesion most consistent with osseous the metastasis.   In light of findings, he then underwent a video bronchoscopy with EBUS on 01/19/24 under the care of Dr. Shelah. Fine needle aspiration and brushing biopsy of the RUL indicated adenocarcinoma.  Biopsy of the station 7 lymph node showed no malignant cells identified.   Patient case was presented to the tumor board on 01/29/24.   Recommendations were for SBRT  Patient reports to be doing well overall today. He denies any left-sided back, chest, or side pain. He denies any worsening shortness of breath, cough, hemoptysis, or any other recent changes to his breathing.   PREVIOUS RADIATION THERAPY: Yes Indication for treatment: Curative       Radiation treatment dates: 01/29/23 through 02/03/23   Site/dose: Right lung - 54 Gy delivered in 3 Fx at 18 Gy/Fx  Technique/Mode: SBRT/SRT-IMRT / Photon  Beams/energy: 6X-FFF  PAST MEDICAL HISTORY:  Past Medical History:  Diagnosis Date   Arthritis    Bacteremia    2016 or so with hospitalization   Emphysema of lung (HCC) 07/20/2019   History of radiation therapy    Right Lung, 01/29/2023 - 02/03/2023 Dr. Shannon   Inguinal hernia recurrent unilateral    herniation of mesentary and loops of bowel to right scrotal sac, 4.5 cm defect   Lesion of right lung 07/20/2019   Lumbar radiculopathy 02/15/2021   Pulmonary emboli (HCC) 07/20/2019    PAST SURGICAL HISTORY: Past Surgical History:  Procedure Laterality Date   BRONCHIAL BIOPSY  12/16/2022   Procedure: BRONCHIAL BIOPSIES;  Surgeon: Shelah Lamar RAMAN, MD;  Location: [facility] ENDOSCOPY;  Service: Pulmonary;;   BRONCHIAL BRUSHINGS  12/16/2022   Procedure: BRONCHIAL BRUSHINGS;  Surgeon: Shelah Lamar RAMAN, MD;  Location[facility] ENDOSCOPY;  Service: Pulmonary;;   BRONCHIAL NEEDLE ASPIRATION BIOPSY  12/16/2022   Procedure: BRONCHIAL NEEDLE ASPIRATION BIOPSIES;  Surgeon: Shelah Lamar RAMAN, MD;  Location: [facility] ENDOSCOPY;  Service: Pulmonary;;   ENDOBRONCHIAL  ULTRASOUND  01/19/2024   Procedure: ENDOBRONCHIAL ULTRASOUND (EBUS);  Surgeon: Shelah Lamar RAMAN, MD;  Location: [facility] ENDOSCOPY;  Service: Pulmonary;;   FIDUCIAL MARKER PLACEMENT  12/16/2022   Procedure: FIDUCIAL MARKER PLACEMENT;  Surgeon: Shelah Lamar RAMAN, MD;  Location: [facility] ENDOSCOPY;  Service: Pulmonary;;   FINE NEEDLE ASPIRATION  01/19/2024   Procedure: FINE NEEDLE ASPIRATION;  Surgeon:  Shelah Lamar RAMAN, MD;  Location: [facility] ENDOSCOPY;  Service: Pulmonary;;   VIDEO BRONCHOSCOPY WITH ENDOBRONCHIAL NAVIGATION Right 01/19/2024   Procedure: VIDEO BRONCHOSCOPY WITH ENDOBRONCHIAL NAVIGATION;  Surgeon: Shelah Lamar RAMAN, MD;  Location: [facility] ENDOSCOPY;  Service: Pulmonary;  Laterality: Right;  Need EBUS scope also    FAMILY HISTORY:  Family History  Problem Relation Age of Onset   Cancer Mother    Heart disease Father    Breast cancer Sister    Stomach cancer Maternal Aunt    Colon cancer Neg Hx    Colon polyps Neg Hx    Esophageal cancer Neg Hx    Rectal cancer Neg Hx     SOCIAL HISTORY:  Social History   Tobacco Use   Smoking status: Some Days    Current packs/day: 1.00    Average packs/day: 1 pack/day for 55.8 years (55.8 ttl pk-yrs)    Types: Cigarettes    Start date: 1970   Smokeless tobacco: Never   Tobacco comments:    4 cigarettes smoked daily PAP 02/20/2022  Vaping Use   Vaping status: Never Used  Substance Use Topics   Alcohol use: Yes    Alcohol/week: 2.0 standard drinks of alcohol    Types: 2 Cans of beer per week    Comment: every night   Drug use: Yes    Frequency: 3.0 times per week    Types: Marijuana    ALLERGIES: No Known Allergies  MEDICATIONS:  Current Outpatient Medications  Medication Sig Dispense Refill   acetaminophen  (TYLENOL ) 500 MG tablet Take 1,000 mg by mouth every 8 (eight) hours as needed for moderate pain (pain score 4-6).     albuterol  (VENTOLIN  HFA) 108 (90 Base) MCG/ACT inhaler Inhale 2 puffs into the lungs every 4 (four) hours as needed for wheezing or shortness of breath. 8.5 g 6   Multiple Vitamin (MULTIVITAMIN WITH MINERALS) TABS tablet Take 1 tablet by mouth daily. 30 tablet 0   naproxen sodium (ALEVE) 220 MG tablet Take 220 mg by mouth.     traZODone  (DESYREL ) 50 MG tablet Take 1 tablet (50 mg total) by mouth at bedtime as needed for sleep. 30 tablet 6   thiamine  100 MG tablet Take 1 tablet (100 mg total) by mouth daily.  (Patient not taking: Reported on 02/05/2024) 30 tablet 0   No current facility-administered medications for this encounter.    REVIEW OF SYSTEMS:  Notable for that above.    PHYSICAL EXAM:  height is 6' (1.829 m) (pended) and weight is 132 lb 6 oz (60 kg) (pended). His temporal temperature is 97.3 F (36.3 C) (abnormal, pended). His blood pressure is 131/86 (pended) and his pulse is 104 (abnormal, pended). His respiration is 20 (pended).   General: Alert and oriented, in no acute distress, accompanied by sister on evaluation HEENT: Head is normocephalic. Extraocular movements are intact.  Neck: Neck is supple, no palpable cervical or supraclavicular lymphadenopathy. Heart: Regular in rate and rhythm with no murmurs, rubs, or gallops. Chest: Clear to auscultation bilaterally, with no rhonchi, wheezes, or rales. Abdomen: Soft, nontender, nondistended, with no rigidity or guarding. Extremities:  No cyanosis or edema. Lymphatics: see Neck Exam Skin: No concerning lesions. Musculoskeletal: symmetric strength and muscle tone throughout. Neurologic: Cranial nerves II through XII are grossly intact. No obvious focalities. Speech is fluent. Coordination is intact. Psychiatric: Judgment and insight are intact. Affect is appropriate.   ECOG = 1  0 - Asymptomatic (Fully active, able to carry on all predisease activities without restriction)  1 - Symptomatic but completely ambulatory (Restricted in physically strenuous activity but ambulatory and able to carry out work of a light or sedentary nature. For example, light housework, office work)  2 - Symptomatic, <50% in bed during the day (Ambulatory and capable of all self care but unable to carry out any work activities. Up and about more than 50% of waking hours)  3 - Symptomatic, >50% in bed, but not bedbound (Capable of only limited self-care, confined to bed or chair 50% or more of waking hours)  4 - Bedbound (Completely disabled. Cannot carry  on any self-care. Totally confined to bed or chair)  5 - Death   Raylene MM, Creech RH, Tormey DC, et al. [phone]). Toxicity and response criteria of the [facility]. Am. DOROTHA Bridges. Oncol. 5 (6): 649-55  LABORATORY DATA:  Lab Results  Component Value Date   WBC 7.0 02/02/2024   HGB 18.1 (H) 02/02/2024   HCT 53.2 (H) 02/02/2024   MCV 85.1 02/02/2024   PLT 217 02/02/2024   NEUTROABS 5.2 02/02/2024   Lab Results  Component Value Date   NA 138 02/02/2024   K 4.4 02/02/2024   CL 103 02/02/2024   CO2 29 02/02/2024   GLUCOSE 71 02/02/2024   BUN 10 02/02/2024   CREATININE 0.94 02/02/2024   CALCIUM 9.5 02/02/2024      RADIOGRAPHY: DG Chest Port 1 View Result Date: 01/19/2024 CLINICAL DATA:  Status post bronchoscopy and biopsy EXAM: PORTABLE CHEST 1 VIEW COMPARISON:  Chest radiograph dated 12/16/2022, CT chest dated 11/24/2023 FINDINGS: Interval placement of additional fiducial in the mid right lung. Unchanged fiducials in the medial and basilar right lower lung. Similar volume loss and architectural distortion involving the right lung base and left lateral mid lung. Bilateral pleural effusions, better evaluated on prior CT. No pneumothorax. The heart size and mediastinal contours are within normal limits. No acute osseous abnormality. IMPRESSION: 1. Interval placement of additional fiducial in the mid right lung. No pneumothorax. 2. Similar volume loss and architectural distortion involving the right lung base and left lateral mid lung. Electronically Signed   By: Limin  Xu M.D.   On: 01/19/2024 11:07   DG C-ARM BRONCHOSCOPY Result Date: 01/19/2024 C-ARM BRONCHOSCOPY: Fluoroscopy was utilized by the requesting physician.  No radiographic interpretation.      IMPRESSION: Stage IIB (T3, N0, M0) non-small cell lung cancer, adenocarcinoma. He presented with 2 pulmonary nodules in the right lower lobe; s/p SBRT completed in September 2024, now presenting with pulmonary  metastases in the RUL and left 6th rib sclerotic lesion.   We reviewed this patient's current workup.  He presents today with biopsy confirmed pulmonary metastasis in the right upper lobe.  PET scan also shows hypermetabolism in the left sixth rib, concerning for metastatic disease. Patient is asymptomatic from the lesion at this time.  Of note, mild activity was also appreciated in the right hilum.  This station 7 FNA was negative. This case was discussed at our tumor board and the consensus was to proceed with radiation to the right upper lobe pulmonary metastasis, and monitored  the rest of these findings with a repeat CT scan in 3 months.  Patient expressed understanding and is in agreement with this plan.  Today, I talked to the patient and family about the findings and work-up thus far.  We discussed the natural history of pulmonary metastases and general treatment, highlighting the role of radiotherapy in the management.  We discussed the available radiation techniques, and focused on the details of logistics and delivery.  We reviewed the anticipated acute and late sequelae associated with radiation in this setting.  The patient was encouraged to ask questions that I answered to the best of my ability. A patient consent form was discussed and signed.  We retained a copy for our records.  The patient would like to proceed with radiation and will be scheduled for CT simulation.  PLAN: Patient is scheduled for CT simulation on 02/09/2024. Dr. Shannon anticipates 3-5 fractions directed at the biopsy confirmed RUL pulmonary metastasis.   We look forward to again participating in this patient's care.   40 minutes of total time was spent for this patient encounter, including preparation, face-to-face counseling with the patient and coordination of care, physical exam, and documentation of the encounter.   ------------------------------------------------   Leeroy Due, PA-C   Lynwood CHARM Shannon, PhD,  MD   [facility]  Radiation Oncology Direct Dial: [phone]  Fax: [phone] .com    This document serves as a record of services personally performed by Lynwood Shannon, MD. It was created on his behalf by Reymundo Cartwright, a trained medical scribe. The creation of this record is based on the scribe's personal observations and the provider's statements to them. This document has been checked and approved by the attending provider.

## 2024-02-09 ENCOUNTER — Ambulatory Visit
Admission: RE | Admit: 2024-02-09 | Discharge: 2024-02-09 | Disposition: A | Discharge status: Home / Self Care | Source: Ambulatory Visit | Attending: Radiation Oncology | Admitting: Radiation Oncology

## 2024-02-09 ENCOUNTER — Encounter (HOSPITAL_COMMUNITY): Payer: Self-pay | Admitting: Internal Medicine

## 2024-02-09 DIAGNOSIS — C3432 Malignant neoplasm of lower lobe, left bronchus or lung: Secondary | ICD-10-CM | POA: Insufficient documentation

## 2024-02-09 DIAGNOSIS — M899 Disorder of bone, unspecified: Secondary | ICD-10-CM | POA: Insufficient documentation

## 2024-02-09 DIAGNOSIS — F17210 Nicotine dependence, cigarettes, uncomplicated: Secondary | ICD-10-CM | POA: Insufficient documentation

## 2024-02-09 DIAGNOSIS — C7801 Secondary malignant neoplasm of right lung: Secondary | ICD-10-CM | POA: Diagnosis not present

## 2024-02-09 DIAGNOSIS — C3431 Malignant neoplasm of lower lobe, right bronchus or lung: Secondary | ICD-10-CM

## 2024-02-09 DIAGNOSIS — Z510 Encounter for antineoplastic radiation therapy: Secondary | ICD-10-CM | POA: Diagnosis not present

## 2024-02-10 DIAGNOSIS — Z510 Encounter for antineoplastic radiation therapy: Secondary | ICD-10-CM | POA: Diagnosis not present

## 2024-02-10 DIAGNOSIS — C3431 Malignant neoplasm of lower lobe, right bronchus or lung: Secondary | ICD-10-CM | POA: Diagnosis not present

## 2024-02-11 ENCOUNTER — Encounter (HOSPITAL_COMMUNITY): Payer: Self-pay | Admitting: Internal Medicine

## 2024-02-23 ENCOUNTER — Ambulatory Visit: Admitting: Radiation Oncology

## 2024-02-24 ENCOUNTER — Ambulatory Visit: Admitting: Radiation Oncology

## 2024-02-25 ENCOUNTER — Other Ambulatory Visit: Payer: Self-pay

## 2024-02-25 ENCOUNTER — Ambulatory Visit: Admitting: Radiation Oncology

## 2024-02-25 ENCOUNTER — Ambulatory Visit
Admission: RE | Admit: 2024-02-25 | Discharge: 2024-02-25 | Disposition: A | Discharge status: Home / Self Care | Source: Ambulatory Visit | Attending: Radiation Oncology | Admitting: Radiation Oncology

## 2024-02-25 DIAGNOSIS — C3431 Malignant neoplasm of lower lobe, right bronchus or lung: Secondary | ICD-10-CM

## 2024-02-25 DIAGNOSIS — Z510 Encounter for antineoplastic radiation therapy: Secondary | ICD-10-CM | POA: Diagnosis not present

## 2024-02-25 LAB — RAD ONC ARIA SESSION SUMMARY
Course Elapsed Days: 0
Plan Fractions Treated to Date: 1
Plan Prescribed Dose Per Fraction: 18 Gy
Plan Total Fractions Prescribed: 3
Plan Total Prescribed Dose: 54 Gy
Reference Point Dosage Given to Date: 18 Gy
Reference Point Session Dosage Given: 18 Gy
Session Number: 1

## 2024-02-26 ENCOUNTER — Ambulatory Visit: Admitting: Radiation Oncology

## 2024-02-27 ENCOUNTER — Other Ambulatory Visit: Payer: Self-pay

## 2024-02-27 ENCOUNTER — Ambulatory Visit: Admitting: Radiation Oncology

## 2024-02-27 ENCOUNTER — Ambulatory Visit
Admission: RE | Admit: 2024-02-27 | Discharge: 2024-02-27 | Disposition: A | Discharge status: Home / Self Care | Source: Ambulatory Visit | Attending: Radiation Oncology | Admitting: Radiation Oncology

## 2024-02-27 DIAGNOSIS — C3431 Malignant neoplasm of lower lobe, right bronchus or lung: Secondary | ICD-10-CM

## 2024-02-27 DIAGNOSIS — Z510 Encounter for antineoplastic radiation therapy: Secondary | ICD-10-CM | POA: Diagnosis not present

## 2024-02-27 LAB — RAD ONC ARIA SESSION SUMMARY
Course Elapsed Days: 2
Plan Fractions Treated to Date: 2
Plan Prescribed Dose Per Fraction: 18 Gy
Plan Total Fractions Prescribed: 3
Plan Total Prescribed Dose: 54 Gy
Reference Point Dosage Given to Date: 36 Gy
Reference Point Session Dosage Given: 18 Gy
Session Number: 2

## 2024-03-01 ENCOUNTER — Other Ambulatory Visit: Payer: Self-pay

## 2024-03-01 ENCOUNTER — Ambulatory Visit
Admission: RE | Admit: 2024-03-01 | Discharge: 2024-03-01 | Disposition: A | Discharge status: Home / Self Care | Source: Ambulatory Visit | Attending: Radiation Oncology | Admitting: Radiation Oncology

## 2024-03-01 DIAGNOSIS — Z510 Encounter for antineoplastic radiation therapy: Secondary | ICD-10-CM | POA: Diagnosis not present

## 2024-03-01 DIAGNOSIS — C7801 Secondary malignant neoplasm of right lung: Secondary | ICD-10-CM | POA: Diagnosis not present

## 2024-03-01 DIAGNOSIS — C3431 Malignant neoplasm of lower lobe, right bronchus or lung: Secondary | ICD-10-CM | POA: Diagnosis not present

## 2024-03-01 DIAGNOSIS — F17210 Nicotine dependence, cigarettes, uncomplicated: Secondary | ICD-10-CM | POA: Diagnosis not present

## 2024-03-01 DIAGNOSIS — C3432 Malignant neoplasm of lower lobe, left bronchus or lung: Secondary | ICD-10-CM | POA: Diagnosis not present

## 2024-03-01 LAB — RAD ONC ARIA SESSION SUMMARY
Course Elapsed Days: 5
Plan Fractions Treated to Date: 3
Plan Prescribed Dose Per Fraction: 18 Gy
Plan Total Fractions Prescribed: 3
Plan Total Prescribed Dose: 54 Gy
Reference Point Dosage Given to Date: 54 Gy
Reference Point Session Dosage Given: 18 Gy
Session Number: 3

## 2024-03-02 ENCOUNTER — Ambulatory Visit: Admitting: Radiation Oncology

## 2024-03-02 NOTE — Radiation Completion Notes (Addendum)
  Radiation Oncology         [phone] ________________________________  Name: Marcus Pace MRN: [id number]  Date of Service: 03/01/2024  DOB: 1955-05-30  End of Treatment Note  Diagnosis: Pulmonary metastases from stage IIB (T3, N0, M0) NSCLC, adenocarcinoma of the RLL Intent: Curative     ==========DELIVERED PLANS==========  First Treatment Date: 2024-02-25 Last Treatment Date: 2024-03-01   Plan Name: Lung_RT_SBRT Site: Lung, Right Technique: SBRT/SRT-IMRT Mode: Photon Dose Per Fraction: 18 Gy Prescribed Dose (Delivered / Prescribed): 54 Gy / 54 Gy Prescribed Fxs (Delivered / Prescribed): 3 / 3     ====================================   The patient tolerated radiation well without any significant acute side effects.   The patient will return in one month and will continue follow up with Dr. Sherrod as well.      Ronita Due, PA-C

## 2024-03-04 ENCOUNTER — Ambulatory Visit: Admitting: Radiation Oncology

## 2024-03-30 ENCOUNTER — Encounter: Payer: Self-pay | Admitting: Radiation Oncology

## 2024-04-03 NOTE — Progress Notes (Signed)
 Radiation Oncology         [phone] ________________________________  Name: Marcus Pace MRN: [id number]  Date: 04/05/2024  DOB: 1955/05/21  Follow-Up Visit Note  CC: Delbert Clam, MD  Delbert Clam, MD  No diagnosis found.  Diagnosis: Stage IIB (T3, N0, M0) non-small cell lung cancer, adenocarcinoma. He presented with 2 pulmonary nodules in the right lower lobe; s/p SBRT completed in September 2024, Presented with pulmonary metastases in the RUL and left 6th rib sclerotic lesion in October 2025  Interval Since Last Radiation: 1 month and 4 days   2) Intent: Curative  Radiation Treatment Dates: First Treatment Date: 2024-02-25 -- Last Treatment Date: 2024-03-01 Site/Dose/Technique/Mode:  Plan Name: Lung_RT_SBRT Site: Lung, Right Technique: SBRT/SRT-IMRT Mode: Photon Dose Per Fraction: 18 Gy Prescribed Dose (Delivered / Prescribed): 54 Gy / 54 Gy Prescribed Fxs (Delivered / Prescribed): 3 / 3  1) Intent: Curative       Radiation treatment dates: 01/29/23 through 02/03/23   Site/dose: Right lung - 54 Gy delivered in 3 Fx at 18 Gy/Fx  Technique/Mode: SBRT/SRT-IMRT / Photon  Beams/energy: 6X-FFF  Narrative:  The patient returns today for routine follow-up. He tolerated radiation well without any significant acute side effects. No other significant interval history since the patient completed radiation therapy, or in the interval since he began radiation therapy.   ***                              Allergies:  has no known allergies.  Meds: Current Outpatient Medications  Medication Sig Dispense Refill   acetaminophen  (TYLENOL ) 500 MG tablet Take 1,000 mg by mouth every 8 (eight) hours as needed for moderate pain (pain score 4-6).     albuterol  (VENTOLIN  HFA) 108 (90 Base) MCG/ACT inhaler Inhale 2 puffs into the lungs every 4 (four) hours as needed for wheezing or shortness of breath. 8.5 g 6   Multiple Vitamin (MULTIVITAMIN WITH MINERALS) TABS tablet Take 1  tablet by mouth daily. 30 tablet 0   naproxen sodium (ALEVE) 220 MG tablet Take 220 mg by mouth.     thiamine  100 MG tablet Take 1 tablet (100 mg total) by mouth daily. (Patient not taking: Reported on 02/05/2024) 30 tablet 0   traZODone  (DESYREL ) 50 MG tablet Take 1 tablet (50 mg total) by mouth at bedtime as needed for sleep. 30 tablet 6   No current facility-administered medications for this encounter.    Physical Findings: The patient is in no acute distress. Patient is alert and oriented.  vitals were not taken for this visit. .  No significant changes. Lungs are clear to auscultation bilaterally. Heart has regular rate and rhythm. No palpable cervical, supraclavicular, or axillary adenopathy. Abdomen soft, non-tender, normal bowel sounds.   Lab Findings: Lab Results  Component Value Date   WBC 7.0 02/02/2024   HGB 18.1 (H) 02/02/2024   HCT 53.2 (H) 02/02/2024   MCV 85.1 02/02/2024   PLT 217 02/02/2024    Radiographic Findings: No results found.  Impression: Stage IIB (T3, N0, M0) non-small cell lung cancer, adenocarcinoma. He presented with 2 pulmonary nodules in the right lower lobe; s/p SBRT completed in September 2024, Presented with pulmonary metastases in the RUL and left 6th rib sclerotic lesion in October 2025  The patient is recovering from the effects of radiation.  ***  Plan:  ***   *** minutes of total time was spent for this patient encounter,  including preparation, face-to-face counseling with the patient and coordination of care, physical exam, and documentation of the encounter. ____________________________________  Lynwood CHARM Nasuti, PhD, MD  This document serves as a record of services personally performed by Lynwood Nasuti, MD. It was created on his behalf by Dorthy Fuse, a trained medical scribe. The creation of this record is based on the scribe's personal observations and the provider's statements to them. This document has been checked and approved by  the attending provider.

## 2024-04-05 ENCOUNTER — Ambulatory Visit
Admission: RE | Admit: 2024-04-05 | Discharge: 2024-04-05 | Disposition: A | Source: Ambulatory Visit | Attending: Radiation Oncology | Admitting: Radiation Oncology

## 2024-04-05 ENCOUNTER — Encounter: Payer: Self-pay | Admitting: Radiation Oncology

## 2024-04-05 VITALS — BP 136/90 | HR 104 | Temp 97.6°F | Resp 20 | Ht 72.0 in | Wt 132.6 lb

## 2024-04-05 DIAGNOSIS — Z923 Personal history of irradiation: Secondary | ICD-10-CM | POA: Diagnosis not present

## 2024-04-05 DIAGNOSIS — C3431 Malignant neoplasm of lower lobe, right bronchus or lung: Secondary | ICD-10-CM | POA: Diagnosis not present

## 2024-04-05 DIAGNOSIS — C7801 Secondary malignant neoplasm of right lung: Secondary | ICD-10-CM | POA: Insufficient documentation

## 2024-04-05 DIAGNOSIS — C7951 Secondary malignant neoplasm of bone: Secondary | ICD-10-CM | POA: Insufficient documentation

## 2024-04-05 DIAGNOSIS — Z79899 Other long term (current) drug therapy: Secondary | ICD-10-CM | POA: Insufficient documentation

## 2024-04-05 NOTE — Progress Notes (Signed)
 KALAN YELEY is here today for follow up post radiation to the lung.  Lung Side: Right,patient completed treatment on 03/01/24.  Does the patient complain of any of the following: Pain: No Shortness of breath w/wo exertion: Yes  Cough: Yes  Hemoptysis: No Pain with swallowing: No Swallowing/choking concerns: no Appetite: Good  Energy Level: Low Post radiation skin Changes: No    Additional comments if applicable:   BP (!) 136/90   Pulse (!) 104   Temp 97.6 F (36.4 C)   Resp 20   Ht 6' (1.829 m)   Wt 132 lb 9.6 oz (60.1 kg)   SpO2 100%   BMI 17.98 kg/m

## 2024-04-06 ENCOUNTER — Ambulatory Visit: Payer: Medicare HMO | Attending: Family Medicine

## 2024-04-06 VITALS — Ht 72.0 in | Wt 135.0 lb

## 2024-04-06 DIAGNOSIS — Z0000 Encounter for general adult medical examination without abnormal findings: Secondary | ICD-10-CM

## 2024-04-06 NOTE — Patient Instructions (Signed)
 Marcus Pace,  Thank you for taking the time for your Medicare Wellness Visit. I appreciate your continued commitment to your health goals. Please review the care plan we discussed, and feel free to reach out if I can assist you further.  Please note that Annual Wellness Visits do not include a physical exam. Some assessments may be limited, especially if the visit was conducted virtually. If needed, we may recommend an in-person follow-up with your provider.  Ongoing Care Seeing your primary care provider every 3 to 6 months helps us  monitor your health and provide consistent, personalized care.   Referrals If a referral was made during today's visit and you haven't received any updates within two weeks, please contact the referred provider directly to check on the status.  Recommended Screenings:  Health Maintenance  Topic Date Due   COVID-19 Vaccine (1) Never done   DTaP/Tdap/Td vaccine (1 - Tdap) Never done   Zoster (Shingles) Vaccine (1 of 2) Never done   Flu Shot  12/05/2023   Medicare Annual Wellness Visit  03/31/2024   Colon Cancer Screening  08/11/2030   Pneumococcal Vaccine for age over 16  Completed   Hepatitis C Screening  Completed   Meningitis B Vaccine  Aged Out       04/06/2024   10:41 AM  Advanced Directives  Does Patient Have a Medical Advance Directive? No  Would patient like information on creating a medical advance directive? No - Patient declined    Vision: Annual vision screenings are recommended for early detection of glaucoma, cataracts, and diabetic retinopathy. These exams can also reveal signs of chronic conditions such as diabetes and high blood pressure.  Dental: Annual dental screenings help detect early signs of oral cancer, gum disease, and other conditions linked to overall health, including heart disease and diabetes.  Please see the attached documents for additional preventive care recommendations.

## 2024-04-06 NOTE — Progress Notes (Signed)
 I connected with  Marcus Pace on 04/06/24 by a audio enabled telemedicine application and verified that I am speaking with the correct person using two identifiers.  Marcus Pace Location: Home  Provider Location: Home Office  Persons Participating in Visit: Marcus Pace.  I discussed the limitations of evaluation and management by telemedicine. The Marcus Pace expressed understanding and agreed to proceed.   Vital Signs: Because this visit was a virtual/telehealth visit, some criteria may be missing or Marcus Pace reported. Any vitals not documented were not able to be obtained and vitals that have been documented are Marcus Pace reported.     Chief Complaint  Marcus Pace presents with   Medicare Wellness    SUBSEQUENT     Subjective:   Marcus Pace is a 68 y.o. male who presents for a Medicare Annual Wellness Visit.  Visit info / Clinical Intake: Medicare Wellness Visit Type:: Subsequent Annual Wellness Visit Persons participating in visit and providing information:: Marcus Pace Medicare Wellness Visit Mode:: Telephone If telephone:: video error (NO VIDEO ACCESS) Since this visit was completed virtually, some vitals may be partially provided or unavailable. Missing vitals are due to the limitations of the virtual format.: Documented vitals are Marcus Pace reported If Telephone or Video please confirm:: I connected with Marcus Pace using audio/video enable telemedicine. I verified Marcus Pace identity with two identifiers, discussed telehealth limitations, and Marcus Pace agreed to proceed. Marcus Pace Location:: HOME Provider Location:: HOME OFFICE Interpreter Needed?: No Pre-visit prep was completed: yes AWV questionnaire completed by Marcus Pace prior to visit?: no Living arrangements:: (!) lives alone Marcus Pace's Overall Health Status Rating: good Typical amount of pain: some Does pain affect daily life?: no Are you currently prescribed opioids?: no  Dietary Habits and Nutritional Risks How many meals a day?:  3 (SNACKS IN BETWEEN) Eats fruit and vegetables daily?: (!) no Most meals are obtained by: preparing own meals In the last 2 weeks, have you had any of the following?: none Diabetic:: no  Functional Status Activities of Daily Living (to include ambulation/medication): Independent Ambulation: Independent with device- listed below Home Assistive Devices/Equipment: Cane Medication Administration: Independent Home Management (perform basic housework or laundry): Independent Manage your own finances?: yes Primary transportation is: facility / other (NO CAR AT THIS TIME, RIDES THE BUS) Concerns about vision?: no *vision screening is required for WTM* Concerns about hearing?: no  Fall Screening Falls in the past year?: 0 Number of falls in past year: 0 Was there an injury with Fall?: 0 Fall Risk Category Calculator: 0 Marcus Pace Fall Risk Level: Low Fall Risk  Fall Risk Marcus Pace at Risk for Falls Due to: Impaired balance/gait Fall risk Follow up: Falls evaluation completed; Education provided  Home and Transportation Safety: All rugs have non-skid backing?: N/A, no rugs All stairs or steps have railings?: N/A, no stairs Grab bars in the bathtub or shower?: yes Have non-skid surface in bathtub or shower?: yes Good home lighting?: yes Regular seat belt use?: yes Hospital stays in the last year:: no  Cognitive Assessment Difficulty concentrating, remembering, or making decisions? : no Will 6CIT or Mini Cog be Completed: no 6CIT or Mini Cog Declined: Marcus Pace alert, oriented, able to answer questions appropriately and recall recent events  Advance Directives (For Healthcare) Does Marcus Pace Have a Medical Advance Directive?: No Would Marcus Pace like information on creating a medical advance directive?: No - Marcus Pace declined  Reviewed/Updated  Reviewed/Updated: Reviewed All (Medical, Surgical, Family, Medications, Allergies, Care Teams, Marcus Pace Goals)    Allergies (verified) Marcus Pace has  no known allergies.   Current  Medications (verified) Outpatient Encounter Medications as of 04/06/2024  Medication Sig   acetaminophen  (TYLENOL ) 500 MG tablet Take 1,000 mg by mouth every 8 (eight) hours as needed for moderate pain (pain score 4-6).   albuterol  (VENTOLIN  HFA) 108 (90 Base) MCG/ACT inhaler Inhale 2 puffs into the lungs every 4 (four) hours as needed for wheezing or shortness of breath.   Multiple Vitamin (MULTIVITAMIN WITH MINERALS) TABS tablet Take 1 tablet by mouth daily.   naproxen sodium (ALEVE) 220 MG tablet Take 220 mg by mouth.   thiamine  100 MG tablet Take 1 tablet (100 mg total) by mouth daily. (Marcus Pace not taking: Reported on 02/05/2024)   traZODone  (DESYREL ) 50 MG tablet Take 1 tablet (50 mg total) by mouth at bedtime as needed for sleep.   No facility-administered encounter medications on file as of 04/06/2024.    History: Past Medical History:  Diagnosis Date   Arthritis    Bacteremia    2016 or so with hospitalization   Emphysema of lung (HCC) 07/20/2019   History of radiation therapy    Right Lung, 01/29/2023 - 02/03/2023 Dr. Shannon   History of radiation therapy    Right lung-02/25/24-03/01/24- Dr. Lynwood Shannon   Inguinal hernia recurrent unilateral    herniation of mesentary and loops of bowel to right scrotal sac, 4.5 cm defect   Lesion of right lung 07/20/2019   Lumbar radiculopathy 02/15/2021   Pulmonary emboli (HCC) 07/20/2019   Past Surgical History:  Procedure Laterality Date   BRONCHIAL BIOPSY  12/16/2022   Procedure: BRONCHIAL BIOPSIES;  Surgeon: Shelah Lamar RAMAN, MD;  Location: [facility] ENDOSCOPY;  Service: Pulmonary;;   BRONCHIAL BRUSHINGS  12/16/2022   Procedure: BRONCHIAL BRUSHINGS;  Surgeon: Shelah Lamar RAMAN, MD;  Location: [facility] ENDOSCOPY;  Service: Pulmonary;;   BRONCHIAL NEEDLE ASPIRATION BIOPSY  12/16/2022   Procedure: BRONCHIAL NEEDLE ASPIRATION BIOPSIES;  Surgeon: Shelah Lamar RAMAN, MD;  Location: [facility] ENDOSCOPY;  Service: Pulmonary;;    ENDOBRONCHIAL ULTRASOUND  01/19/2024   Procedure: ENDOBRONCHIAL ULTRASOUND (EBUS);  Surgeon: Shelah Lamar RAMAN, MD;  Location: [facility] ENDOSCOPY;  Service: Pulmonary;;   FIDUCIAL MARKER PLACEMENT  12/16/2022   Procedure: FIDUCIAL MARKER PLACEMENT;  Surgeon: Shelah Lamar RAMAN, MD;  Location: [facility] ENDOSCOPY;  Service: Pulmonary;;   FINE NEEDLE ASPIRATION  01/19/2024   Procedure: FINE NEEDLE ASPIRATION;  Surgeon: Shelah Lamar RAMAN, MD;  Location: [facility] ENDOSCOPY;  Service: Pulmonary;;   VIDEO BRONCHOSCOPY WITH ENDOBRONCHIAL NAVIGATION Right 01/19/2024   Procedure: VIDEO BRONCHOSCOPY WITH ENDOBRONCHIAL NAVIGATION;  Surgeon: Shelah Lamar RAMAN, MD;  Location: [facility] ENDOSCOPY;  Service: Pulmonary;  Laterality: Right;  Need EBUS scope also   Family History  Problem Relation Age of Onset   Cancer Mother    Heart disease Father    Breast cancer Sister    Stomach cancer Maternal Aunt    Colon cancer Neg Hx    Colon polyps Neg Hx    Esophageal cancer Neg Hx    Rectal cancer Neg Hx    Social History   Occupational History   Not on file  Tobacco Use   Smoking status: Some Days    Current packs/day: 1.00    Average packs/day: 1 pack/day for 55.9 years (55.9 ttl pk-yrs)    Types: Cigarettes    Start date: 1970   Smokeless tobacco: Never   Tobacco comments:    4 cigarettes smoked daily PAP 02/20/2022  Vaping Use   Vaping status: Never Used  Substance and Sexual Activity   Alcohol use: Yes  Alcohol/week: 2.0 standard drinks of alcohol    Types: 2 Cans of beer per week    Comment: every night   Drug use: Yes    Frequency: 3.0 times per week    Types: Marijuana   Sexual activity: Not Currently   Tobacco Counseling Ready to quit: Not Answered Counseling given: Not Answered Tobacco comments: 4 cigarettes smoked daily PAP 02/20/2022  SDOH Screenings   Food Insecurity: Food Insecurity Present (04/06/2024)  Housing: Low Risk  (04/06/2024)  Transportation Needs: No Transportation Needs (04/06/2024)  Utilities:  Not At Risk (04/06/2024)  Alcohol Screen: Low Risk  (04/06/2024)  Depression (PHQ2-9): Low Risk  (04/06/2024)  Financial Resource Strain: Low Risk  (04/01/2023)  Physical Activity: Sufficiently Active (04/06/2024)  Social Connections: Socially Isolated (04/06/2024)  Stress: No Stress Concern Present (04/06/2024)  Tobacco Use: High Risk (04/06/2024)  Health Literacy: Adequate Health Literacy (04/06/2024)   See flowsheets for full screening details  Depression Screen PHQ 2 & 9 Depression Scale- Over the past 2 weeks, how often have you been bothered by any of the following problems? Little interest or pleasure in doing things: 0 Feeling down, depressed, or hopeless (PHQ Adolescent also includes...irritable): 0 PHQ-2 Total Score: 0 Trouble falling or staying asleep, or sleeping too much: 0 Feeling tired or having little energy: 0 Poor appetite or overeating (PHQ Adolescent also includes...weight loss): 0 Feeling bad about yourself - or that you are a failure or have let yourself or your family down: 0 Trouble concentrating on things, such as reading the newspaper or watching television (PHQ Adolescent also includes...like school work): 0 Moving or speaking so slowly that other people could have noticed. Or the opposite - being so fidgety or restless that you have been moving around a lot more than usual: 0 Thoughts that you would be better off dead, or of hurting yourself in some way: 0 PHQ-9 Total Score: 0 If you checked off any problems, how difficult have these problems made it for you to do your work, take care of things at home, or get along with other people?: Not difficult at all  Depression Treatment Depression Interventions/Treatment : EYV7-0 Score <4 Follow-up Not Indicated     Goals Addressed             This Visit's Progress    My goal for 2026:       To maintain my health the best I can, so I can make it to see 2026. Read my Bible everyday. Walk for physical activity.              Objective:    Today's Vitals   04/06/24 1038  Weight: 135 lb (61.2 kg)  Height: 6' (1.829 m)  PainSc: 0-No pain   Body mass index is 18.31 kg/m.  Hearing/Vision screen Hearing Screening - Comments:: Adequate hearing, no hearing aids. Vision Screening - Comments:: Adequate vision with reading glasses. Not up to date with eye exam. Immunizations and Health Maintenance Health Maintenance  Topic Date Due   COVID-19 Vaccine (1) Never done   DTaP/Tdap/Td (1 - Tdap) Never done   Zoster Vaccines- Shingrix (1 of 2) Never done   Influenza Vaccine  12/05/2023   Medicare Annual Wellness (AWV)  04/06/2025   Colonoscopy  08/11/2030   Pneumococcal Vaccine: 50+ Years  Completed   Hepatitis C Screening  Completed   Meningococcal B Vaccine  Aged Out        Assessment/Plan:  This is a routine wellness examination for [place].  Marcus Pace Care Team: Delbert Clam, MD as PCP - General (Family Medicine) Shannon Agent, MD as Consulting Physician (Radiation Oncology) Shelah Lamar RAMAN, MD as Consulting Physician (Pulmonary Disease)  I have personally reviewed and noted the following in the Marcus Pace's chart:   Medical and social history Use of alcohol, tobacco or illicit drugs  Current medications and supplements including opioid prescriptions. Functional ability and status Nutritional status Physical activity Advanced directives List of other physicians Hospitalizations, surgeries, and ER visits in previous 12 months Vitals Screenings to include cognitive, depression, and falls Referrals and appointments  No orders of the defined types were placed in this encounter.  In addition, I have reviewed and discussed with Marcus Pace certain preventive protocols, quality metrics, and best practice recommendations. A written personalized care plan for preventive services as well as general preventive health recommendations were provided to Marcus Pace.   Roz LOISE Fuller,  LPN   [id number]   Return in about 1 year (around 04/06/2025) for Medicare wellness.  After Visit Summary: (Declined) Due to this being a telephonic visit, with patients personalized plan was offered to Marcus Pace but Marcus Pace Declined AVS at this time   Nurse Notes: Marcus Pace is aware of current care gaps.

## 2024-05-03 ENCOUNTER — Inpatient Hospital Stay: Attending: Internal Medicine

## 2024-05-03 DIAGNOSIS — C3411 Malignant neoplasm of upper lobe, right bronchus or lung: Secondary | ICD-10-CM | POA: Diagnosis present

## 2024-05-03 DIAGNOSIS — F17210 Nicotine dependence, cigarettes, uncomplicated: Secondary | ICD-10-CM | POA: Insufficient documentation

## 2024-05-03 DIAGNOSIS — C3490 Malignant neoplasm of unspecified part of unspecified bronchus or lung: Secondary | ICD-10-CM

## 2024-05-03 DIAGNOSIS — R911 Solitary pulmonary nodule: Secondary | ICD-10-CM | POA: Diagnosis not present

## 2024-05-03 LAB — CBC WITH DIFFERENTIAL (CANCER CENTER ONLY)
Abs Immature Granulocytes: 0.01 K/uL (ref 0.00–0.07)
Basophils Absolute: 0 K/uL (ref 0.0–0.1)
Basophils Relative: 1 %
Eosinophils Absolute: 0.1 K/uL (ref 0.0–0.5)
Eosinophils Relative: 1 %
HCT: 50.1 % (ref 39.0–52.0)
Hemoglobin: 17.3 g/dL — ABNORMAL HIGH (ref 13.0–17.0)
Immature Granulocytes: 0 %
Lymphocytes Relative: 8 %
Lymphs Abs: 0.6 K/uL — ABNORMAL LOW (ref 0.7–4.0)
MCH: 29.8 pg (ref 26.0–34.0)
MCHC: 34.5 g/dL (ref 30.0–36.0)
MCV: 86.2 fL (ref 80.0–100.0)
Monocytes Absolute: 0.7 K/uL (ref 0.1–1.0)
Monocytes Relative: 9 %
Neutro Abs: 5.8 K/uL (ref 1.7–7.7)
Neutrophils Relative %: 81 %
Platelet Count: 222 K/uL (ref 150–400)
RBC: 5.81 MIL/uL (ref 4.22–5.81)
RDW: 14 % (ref 11.5–15.5)
WBC Count: 7.2 K/uL (ref 4.0–10.5)
nRBC: 0 % (ref 0.0–0.2)

## 2024-05-03 LAB — CMP (CANCER CENTER ONLY)
ALT: 18 U/L (ref 0–44)
AST: 25 U/L (ref 15–41)
Albumin: 4 g/dL (ref 3.5–5.0)
Alkaline Phosphatase: 98 U/L (ref 38–126)
Anion gap: 8 (ref 5–15)
BUN: 10 mg/dL (ref 8–23)
CO2: 29 mmol/L (ref 22–32)
Calcium: 9.1 mg/dL (ref 8.9–10.3)
Chloride: 103 mmol/L (ref 98–111)
Creatinine: 0.99 mg/dL (ref 0.61–1.24)
GFR, Estimated: >60 mL/min
Glucose, Bld: 100 mg/dL — ABNORMAL HIGH (ref 70–99)
Potassium: 3.8 mmol/L (ref 3.5–5.1)
Sodium: 139 mmol/L (ref 135–145)
Total Bilirubin: 1.1 mg/dL (ref 0.0–1.2)
Total Protein: 7.3 g/dL (ref 6.5–8.1)

## 2024-05-07 ENCOUNTER — Ambulatory Visit (HOSPITAL_COMMUNITY)
Admission: RE | Admit: 2024-05-07 | Discharge: 2024-05-07 | Disposition: A | Discharge status: Home / Self Care | Source: Ambulatory Visit | Attending: Physician Assistant | Admitting: Physician Assistant

## 2024-05-07 DIAGNOSIS — C3490 Malignant neoplasm of unspecified part of unspecified bronchus or lung: Secondary | ICD-10-CM | POA: Diagnosis present

## 2024-05-07 MED ORDER — IOHEXOL 300 MG/ML  SOLN
75.0000 mL | Freq: Once | INTRAMUSCULAR | Status: AC | PRN
  Administered 2024-05-07: 75 mL via INTRAVENOUS

## 2024-05-10 ENCOUNTER — Other Ambulatory Visit: Payer: Self-pay

## 2024-05-10 ENCOUNTER — Inpatient Hospital Stay: Attending: Internal Medicine | Admitting: Internal Medicine

## 2024-05-10 VITALS — BP 131/89 | HR 94 | Temp 98.3°F | Resp 17 | Ht 72.0 in | Wt 130.0 lb

## 2024-05-10 DIAGNOSIS — C3490 Malignant neoplasm of unspecified part of unspecified bronchus or lung: Secondary | ICD-10-CM

## 2024-05-10 DIAGNOSIS — J439 Emphysema, unspecified: Secondary | ICD-10-CM

## 2024-05-10 NOTE — Progress Notes (Signed)
 "     Marcus Pace [phone]) [phone]   Fax:([phone]  OFFICE PROGRESS NOTE  Marcus Clam, MD [address] [place] [address] [place] [address]  DIAGNOSIS: Stage IIB (T3, N0, M0) non-small cell lung cancer, adenocarcinoma. He presented with 2 pulmonary nodules in the right lower lobe.    Request PDL1 and [facility] today    PRIOR THERAPY:  1) SBRT to the 2 pulmonary nodules in the right lung under the care of Dr. Shannon.  He is scheduled to start on 01/20/2023.  2) SBRT to right upper lobe lung nodule under the care of Dr. Dewey completed on March 01, 2024.  CURRENT THERAPY: Observation  INTERVAL HISTORY: Marcus Pace 69 y.o. male returns to the clinic today for follow-up visit accompanied by his sister.Discussed the use of AI scribe software for clinical note transcription with the patient, who gave verbal consent to proceed.  History of Present Illness Marcus Pace is a 69 year old male with stage IIB non-small cell lung adenocarcinoma status post SBRT who presents for restaging imaging.  He has a history of two right lung nodules, treated with stereotactic body radiation therapy to the right lower lobe in September 2024 and to the right upper lobe in October 2025. He denies chest pain, headache, visual changes, nausea, or vomiting. He has not experienced adverse effects from radiation therapy and states, I have no problem with it.  He continues to experience persistent dyspnea and describes a lot of excess movement, requesting a stronger inhaler due to inadequate symptom control. He is unable to afford Trelegy, his previous inhaler, and has not yet discussed this with his pulmonologist. No chest pain is present.     MEDICAL HISTORY: Past Medical History:  Diagnosis Date   Arthritis    Bacteremia    2016 or so with hospitalization   Emphysema of lung (HCC) 07/20/2019   History of radiation therapy    Right Lung, 01/29/2023 -  02/03/2023 Dr. Shannon   History of radiation therapy    Right lung-02/25/24-03/01/24- Dr. Lynwood Shannon   Inguinal hernia recurrent unilateral    herniation of mesentary and loops of bowel to right scrotal sac, 4.5 cm defect   Lesion of right lung 07/20/2019   Lumbar radiculopathy 02/15/2021   Pulmonary emboli (HCC) 07/20/2019    ALLERGIES:  has no known allergies.  MEDICATIONS:  Current Outpatient Medications  Medication Sig Dispense Refill   acetaminophen  (TYLENOL ) 500 MG tablet Take 1,000 mg by mouth every 8 (eight) hours as needed for moderate pain (pain score 4-6).     albuterol  (VENTOLIN  HFA) 108 (90 Base) MCG/ACT inhaler Inhale 2 puffs into the lungs every 4 (four) hours as needed for wheezing or shortness of breath. 8.5 g 6   Multiple Vitamin (MULTIVITAMIN WITH MINERALS) TABS tablet Take 1 tablet by mouth daily. 30 tablet 0   naproxen sodium (ALEVE) 220 MG tablet Take 220 mg by mouth.     thiamine  100 MG tablet Take 1 tablet (100 mg total) by mouth daily. (Patient not taking: Reported on 02/05/2024) 30 tablet 0   traZODone  (DESYREL ) 50 MG tablet Take 1 tablet (50 mg total) by mouth at bedtime as needed for sleep. 30 tablet 6   No current facility-administered medications for this visit.    SURGICAL HISTORY:  Past Surgical History:  Procedure Laterality Date   BRONCHIAL BIOPSY  12/16/2022   Procedure: BRONCHIAL BIOPSIES;  Surgeon: Shelah Lamar RAMAN, MD;  Location: [facility] ENDOSCOPY;  Service: Pulmonary;;   BRONCHIAL BRUSHINGS  12/16/2022   Procedure: BRONCHIAL BRUSHINGS;  Surgeon: Shelah Lamar RAMAN, MD;  Location: [facility] ENDOSCOPY;  Service: Pulmonary;;   BRONCHIAL NEEDLE ASPIRATION BIOPSY  12/16/2022   Procedure: BRONCHIAL NEEDLE ASPIRATION BIOPSIES;  Surgeon: Shelah Lamar RAMAN, MD;  Location: [facility] ENDOSCOPY;  Service: Pulmonary;;   ENDOBRONCHIAL ULTRASOUND  01/19/2024   Procedure: ENDOBRONCHIAL ULTRASOUND (EBUS);  Surgeon: Shelah Lamar RAMAN, MD;  Location: [facility] ENDOSCOPY;  Service: Pulmonary;;    FIDUCIAL MARKER PLACEMENT  12/16/2022   Procedure: FIDUCIAL MARKER PLACEMENT;  Surgeon: Shelah Lamar RAMAN, MD;  [facility] ENDOSCOPY;  Service: Pulmonary;;   FINE NEEDLE ASPIRATION  01/19/2024   Procedure: FINE NEEDLE ASPIRATION;  Surgeon: Shelah Lamar RAMAN, MD;  Location: [facility] ENDOSCOPY;  Service: Pulmonary;;   VIDEO BRONCHOSCOPY WITH ENDOBRONCHIAL NAVIGATION Right 01/19/2024   Procedure: VIDEO BRONCHOSCOPY WITH ENDOBRONCHIAL NAVIGATION;  Surgeon: Shelah Lamar RAMAN, MD;  Location: [facility] ENDOSCOPY;  Service: Pulmonary;  Laterality: Right;  Need EBUS scope also    REVIEW OF SYSTEMS:  A comprehensive review of systems was negative except for: Respiratory: positive for dyspnea on exertion   PHYSICAL EXAMINATION: General appearance: alert, cooperative, and no distress Head: Normocephalic, without obvious abnormality, atraumatic Neck: no adenopathy, no JVD, supple, symmetrical, trachea midline, and thyroid not enlarged, symmetric, no tenderness/mass/nodules Lymph nodes: Cervical, supraclavicular, and axillary nodes normal. Resp: clear to auscultation bilaterally Back: symmetric, no curvature. ROM normal. No CVA tenderness. Cardio: regular rate and rhythm, S1, S2 normal, no murmur, click, rub or gallop GI: soft, non-tender; bowel sounds normal; no masses,  no organomegaly Extremities: extremities normal, atraumatic, no cyanosis or edema  ECOG PERFORMANCE STATUS: 1 - Symptomatic but completely ambulatory  Blood pressure 131/89, pulse 94, temperature 98.3 F (36.8 C), temperature source Temporal, resp. rate 17, height 6' (1.829 m), weight 130 lb (59 kg), SpO2 91%.  LABORATORY DATA: Lab Results  Component Value Date   WBC 7.2 05/03/2024   HGB 17.3 (H) 05/03/2024   HCT 50.1 05/03/2024   MCV 86.2 05/03/2024   PLT 222 05/03/2024      Chemistry      Component Value Date/Time   NA 139 05/03/2024 0836   NA 142 12/05/2021 1610   K 3.8 05/03/2024 0836   CL 103 05/03/2024 0836   CO2 29 05/03/2024 0836    BUN 10 05/03/2024 0836   BUN 10 12/05/2021 1610   CREATININE 0.99 05/03/2024 0836      Component Value Date/Time   CALCIUM 9.1 05/03/2024 0836   ALKPHOS 98 05/03/2024 0836   AST 25 05/03/2024 0836   ALT 18 05/03/2024 0836   BILITOT 1.1 05/03/2024 0836       RADIOGRAPHIC STUDIES: CT Chest W Contrast Result Date: 05/07/2024 CLINICAL DATA:  Non-small cell lung cancer (NSCLC), non-metastatic, assess treatment response. * Tracking Code: BO * EXAM: CT CHEST WITH CONTRAST TECHNIQUE: Multidetector CT imaging of the chest was performed during intravenous contrast administration. RADIATION DOSE REDUCTION: This exam was performed according to the departmental dose-optimization program which includes automated exposure control, adjustment of the mA and/or kV according to patient size and/or use of iterative reconstruction technique. CONTRAST:  75mL OMNIPAQUE  IOHEXOL  300 MG/ML  SOLN COMPARISON:  Nuclear medicine PET scan from 12/19/2023 and CT scan chest from 11/24/2023. FINDINGS: Cardiovascular: Normal cardiac size. No pericardial effusion. No aortic aneurysm. There are coronary artery calcifications, in keeping with coronary artery disease. Mediastinum/Nodes: Visualized thyroid gland appears grossly unremarkable. No solid / cystic mediastinal masses. The esophagus is nondistended  precluding optimal assessment. There are few mildly prominent mediastinal lymph nodes, which do not meet the size criteria for lymphadenopathy and appear grossly similar to the prior study, favoring benign etiology. No axillary or hilar lymphadenopathy by size criteria. Lungs/Pleura: The central tracheo-bronchial tree is patent. There is frothy soft tissue along the right posterior aspect of the trachea at the level of clavicular heads, favored to represent mucous/secretion. Redemonstration of moderate-to-severe emphysematous changes throughout bilateral lungs. There is stable large emphysematous bullae in the superior segment of  right lower lobe. Redemonstration of irregular heterogeneous masslike opacity in the right lung lower lobe (series 5, image 101) with associated bronchiectasis and at least 2 pre-existing fiducial markers. When compared to the prior PET-CT scan, there is interval decrease in the size of opacity. The opacity measures approximately 4.7 x 6.0 cm orthogonally on axial plane on today's exam (series 2, image 105), versus 6.4 x 6.9 cm on prior exam (exam from 12/19/2023, series 7, image 52). There is stable linear area of scarring/atelectasis in the left lung lower lobe, laterally (series 5, image 95). However, there is new irregular opacity in the left lung upper lobe measuring 6 x 19 mm (series 5, image 29) and another 4 x 4 mm part solid opacity in the left lung lower lobe (series 5, image 80). Previously noted irregular opacities in the right upper lobe have decreased in size and now exhibits a new fiducial marker. No new mass, consolidation, pleural effusion or pneumothorax. Upper Abdomen: There are at least 2, hyperattenuating foci in the right hepatic dome with largest measuring up to 5 x 6 mm (series 2, image 131), which are incompletely characterized on the current exam but unchanged since the prior study and favored to represent flash filling hemangiomas. Remaining visualized upper abdominal viscera within normal limits. Musculoskeletal: The visualized soft tissues of the chest wall are grossly unremarkable. No suspicious osseous lesions. There are mild multilevel degenerative changes in the visualized spine. There is subacute/healing fracture of anterolateral left sixth rib. IMPRESSION: 1. There is interval decrease in the size of irregular masslike opacity in the right lung lower lobe, which exhibits pre-existing fiducial markers. There is also interval decrease in the size of irregular opacities in the right upper lobe, which now exhibits a new fiducial marker. 2. There are new irregular opacities in the left  lung upper and lower lobes, as described above. These are indeterminate and may represent infectious/inflammatory etiology. Attention on short-term follow-up examination is recommended. 3. Multiple other nonacute observations, as described above. Electronically Signed   By: Ree Molt M.D.   On: 05/07/2024 08:22    ASSESSMENT AND PLAN: This is a very pleasant 69 years old African-American male with Stage IIB (T3, N0, M0) non-small cell lung cancer, adenocarcinoma. He presented with 2 pulmonary nodules in the right lower lobe status post SBRT to the right lung nodule.  He is currently on observation. He also received SBRT to right upper lobe lung nodule in October 2025. He is currently on observation. He had repeat CT scan of the chest performed recently.  I personally independently reviewed the scan and discussed the result with the patient and his sister today. Assessment and Plan Assessment & Plan Stage IIB non-small cell lung adenocarcinoma of the right lung, status post SBRT and streptozocin, currently under observation Status post SBRT to the right lower lobe (September 2024) and right upper lobe (October 2025) for Stage IIB non-small cell lung adenocarcinoma of the right lung. No evidence of disease  progression on recent chest CT. He remains asymptomatic from an oncologic perspective, with no new or concerning symptoms attributable to malignancy. No indication for systemic therapy at this time. - Reviewed recent chest CT for restaging, which showed no concerning findings. - Recommended continued surveillance. - Scheduled follow-up in four months with repeat chest CT for surveillance. The patient was advised to call immediately if he has any other concerning symptoms in the interval.  The patient voices understanding of current disease status and treatment options and is in agreement with the current care plan.  All questions were answered. The patient knows to call the clinic with any  problems, questions or concerns. We can certainly see the patient much sooner if necessary.  The total time spent in the appointment was 20 minutes.  Disclaimer: This note was dictated with voice recognition software. Similar sounding words can inadvertently be transcribed and may not be corrected upon review.        "

## 2024-05-11 NOTE — Telephone Encounter (Signed)
 Requested medications are due for refill today.  yes  Requested medications are on the active medications list.  yes  Last refill. 01/13/2023 8.5g  6 rf  Future visit scheduled.   yes  Notes to clinic.  Rx signed by another provider.    Requested Prescriptions  Pending Prescriptions Disp Refills   albuterol  (VENTOLIN  HFA) 108 (90 Base) MCG/ACT inhaler 8.5 g 6    Sig: Inhale 2 puffs into the lungs every 4 (four) hours as needed for wheezing or shortness of breath.     Pulmonology:  Beta Agonists 2 Passed - 05/11/2024  1:51 PM      Passed - Last BP in normal range    BP Readings from Last 1 Encounters:  05/10/24 131/89         Passed - Last Heart Rate in normal range    Pulse Readings from Last 1 Encounters:  05/10/24 94         Passed - Valid encounter within last 12 months    Recent Outpatient Visits           1 year ago Pulmonary emphysema, unspecified emphysema type   [facility] [place]. [facility] Theotis Haze ORN, NP   2 years ago Lung nodules   [facility] [place]. [facility] Delbert Clam, MD   3 years ago Other insomnia   [facility] [place]. [facility] Delbert Clam, MD   3 years ago Other insomnia   [facility] [place]. [facility] Delbert Clam, MD   4 years ago Lumbar radiculopathy   [facility] [place]. [facility] Delbert Clam, MD

## 2024-05-12 ENCOUNTER — Telehealth: Payer: Self-pay | Admitting: Pharmacist

## 2024-05-12 ENCOUNTER — Ambulatory Visit: Payer: Self-pay | Attending: *Deleted | Admitting: *Deleted

## 2024-05-12 ENCOUNTER — Encounter: Payer: Self-pay | Admitting: *Deleted

## 2024-05-12 ENCOUNTER — Other Ambulatory Visit: Payer: Self-pay

## 2024-05-12 DIAGNOSIS — J439 Emphysema, unspecified: Secondary | ICD-10-CM

## 2024-05-12 MED ORDER — BREZTRI AEROSPHERE 160-9-4.8 MCG/ACT IN AERO
2.0000 | INHALATION_SPRAY | Freq: Two times a day (BID) | RESPIRATORY_TRACT | 11 refills | Status: AC
  Filled 2024-05-12: qty 10.7, 30d supply, fill #0

## 2024-05-12 MED ORDER — ALBUTEROL SULFATE HFA 108 (90 BASE) MCG/ACT IN AERS
2.0000 | INHALATION_SPRAY | RESPIRATORY_TRACT | 6 refills | Status: AC | PRN
  Filled 2024-05-12: qty 8.5, 17d supply, fill #0

## 2024-05-12 NOTE — Patient Instructions (Addendum)
 Today we discussed your breathing with history of COPD.   Initially your  oxygen  level was below 90. On recheck it was 92% You have had Trelegy in the past and done well on it but cannot afford it. I will make a pharmacy referral for a more affordable alternative. He contacted me back and suggested Breztri  which I sent to the pharmacy.  He thinks that you may be able to get patient assistance that would cover this medication for free You did ask for a refill of your albuterol  which was sent to the pharmacy of your choice.

## 2024-05-12 NOTE — Progress Notes (Signed)
 "   Patient ID: Marcus Pace, male    DOB: [birth year]  MRN: [id number]  CC: Medical Management of Chronic Issues (Discuss Albuterol  Sulfate )   Subjective: Marcus Pace is a 69 y.o. male who presents for medication refill and discussion of cheaper alternative for Trelegy. 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 He reports that radiation treatment was successful  He has history of COPD and did well on Trelegy in the past. He cannot afford this medication and finds that albuterol  helps some but not as much as the Trelegy did. He wonders what alternative may be available He denies increased cough, wheezing, shortness of breath  His concerns today include: History of lung cancer with lung nodules, history of COPD    Patient Active Problem List   Diagnosis Date Noted   Mediastinal adenopathy 01/19/2024   Right upper lobe pulmonary nodule 01/13/2024   Primary adenocarcinoma of lower lobe of right lung (HCC) 01/02/2023   Lumbar radiculopathy 02/15/2021   Hypoxia 11/15/2020   Pleural effusion 11/15/2020   Inguinal hernia, right 07/20/2019   Pulmonary nodules 07/20/2019   Emphysema of lung (HCC) 07/20/2019   Bronchiectasis (HCC) 07/20/2019   Sigmoid thickening 07/20/2019   Pulmonary emboli (HCC) 07/20/2019   Malnutrition of moderate degree 08/03/2015   Influenza B 08/02/2015   Bacteremia due to Gram-positive bacteria 08/02/2015   Sepsis (HCC) 08/02/2015   Tobacco abuse 08/02/2015   Testicular pain, right 08/02/2015     Medications Ordered Prior to Encounter[1]  Allergies[2]  Social History   Socioeconomic History   Marital status: Single    Spouse name: Not on file   Number of children: Not on file   Years of education: Not on file   Highest education level: Not on file  Occupational History   Not on file  Tobacco Use   Smoking  status: Some Days    Current packs/day: 1.00    Average packs/day: 1 pack/day for 56.0 years (56.0 ttl pk-yrs)    Types: Cigarettes    Start date: 1970   Smokeless tobacco: Never   Tobacco comments:    4 cigarettes smoked daily PAP 02/20/2022  Vaping Use   Vaping status: Never Used  Substance and Sexual Activity   Alcohol use: Yes    Alcohol/week: 2.0 standard drinks of alcohol    Types: 2 Cans of beer per week    Comment: every night   Drug use: Yes    Frequency: 3.0 times per week    Types: Marijuana   Sexual activity: Not Currently  Other Topics Concern   Not on file  Social History Narrative   Patient lives alone.   Social Drivers of Health   Tobacco Use: High Risk (04/06/2024)   Patient History    Smoking Tobacco Use: Some Days    Smokeless Tobacco Use: Never    Passive Exposure: Not on file  Financial Resource Strain: Low Risk (04/01/2023)   Overall Financial Resource Strain (CARDIA)    Difficulty of Paying Living Expenses: Not hard at all  Food Insecurity: Food Insecurity Present (04/06/2024)   Epic    Worried About Programme Researcher, Broadcasting/film/video in the Last Year: Sometimes true    Ran Out of Food in the Last Year: Sometimes true  Transportation Needs: No Transportation Needs (04/06/2024)   Epic    Lack of Transportation (Medical): No  Lack of Transportation (Non-Medical): No  Physical Activity: Sufficiently Active (04/06/2024)   Exercise Vital Sign    Days of Exercise per Week: 5 days    Minutes of Exercise per Session: 30 min  Stress: No Stress Concern Present (04/06/2024)   [employer] of Occupational Health - Occupational Stress Questionnaire    Feeling of Stress: Not at all  Social Connections: Socially Isolated (04/06/2024)   Social Connection and Isolation Panel    Frequency of Communication with Friends and Family: More than three times a week    Frequency of Social Gatherings with Friends and Family: Once a week    Attends Religious Services: Never     Database Administrator or Organizations: No    Attends Banker Meetings: Never    Marital Status: Never married  Intimate Partner Violence: Not At Risk (04/06/2024)   Epic    Fear of Current or Ex-Partner: No    Emotionally Abused: No    Physically Abused: No    Sexually Abused: No  Depression (PHQ2-9): Low Risk (04/06/2024)   Depression (PHQ2-9)    PHQ-2 Score: 0  Alcohol Screen: Low Risk (04/06/2024)   Alcohol Screen    Last Alcohol Screening Score (AUDIT): 3  Housing: Low Risk (04/06/2024)   Epic    Unable to Pay for Housing in the Last Year: No    Number of Times Moved in the Last Year: 0    Homeless in the Last Year: No  Utilities: Not At Risk (04/06/2024)   Epic    Threatened with loss of utilities: No  Health Literacy: Adequate Health Literacy (04/06/2024)   B1300 Health Literacy    Frequency of need for help with medical instructions: Never    Family History  Problem Relation Age of Onset   Cancer Mother    Heart disease Father    Breast cancer Sister    Stomach cancer Maternal Aunt    Colon cancer Neg Hx    Colon polyps Neg Hx    Esophageal cancer Neg Hx    Rectal cancer Neg Hx     Past Surgical History:  Procedure Laterality Date   BRONCHIAL BIOPSY  12/16/2022   Procedure: BRONCHIAL BIOPSIES;  Surgeon: Shelah Lamar RAMAN, MD;  Location: [facility] ENDOSCOPY;  Service: Pulmonary;;   BRONCHIAL BRUSHINGS  12/16/2022   Procedure: BRONCHIAL BRUSHINGS;  Surgeon: Shelah Lamar RAMAN, MD;  Location: [facility] ENDOSCOPY;  Service: Pulmonary;;   BRONCHIAL NEEDLE ASPIRATION BIOPSY  12/16/2022   Procedure: BRONCHIAL NEEDLE ASPIRATION BIOPSIES;  Surgeon: Shelah Lamar RAMAN, MD;  Location: [facility] ENDOSCOPY;  Service: Pulmonary;;   ENDOBRONCHIAL ULTRASOUND  01/19/2024   Procedure: ENDOBRONCHIAL ULTRASOUND (EBUS);  Surgeon: Shelah Lamar RAMAN, MD;  Location: [facility] ENDOSCOPY;  Service: Pulmonary;;   FIDUCIAL MARKER PLACEMENT  12/16/2022   Procedure: FIDUCIAL MARKER PLACEMENT;  Surgeon: Shelah Lamar RAMAN, MD;  Location: [facility] ENDOSCOPY;  Service: Pulmonary;;   FINE NEEDLE ASPIRATION  01/19/2024   Procedure: FINE NEEDLE ASPIRATION;  Surgeon: Shelah Lamar RAMAN, MD;  Location: [facility] ENDOSCOPY;  Service: Pulmonary;;   VIDEO BRONCHOSCOPY WITH ENDOBRONCHIAL NAVIGATION Right 01/19/2024   Procedure: VIDEO BRONCHOSCOPY WITH ENDOBRONCHIAL NAVIGATION;  Surgeon: Shelah Lamar RAMAN, MD;  Location: [facility] ENDOSCOPY;  Service: Pulmonary;  Laterality: Right;  Need EBUS scope also    ROS: Review of Systems Negative except as stated above  PHYSICAL EXAM: BP 131/86   Pulse (!) 107   Temp 97.7 F (36.5 C) (Oral)   Ht 6' (1.829 m)  Wt 129 lb (58.5 kg)   SpO2 (!) 89%   BMI 17.50 kg/m   Physical Exam Vitals and nursing note reviewed.  HENT:     Mouth/Throat:     Mouth: Mucous membranes are moist.     Pharynx: Oropharynx is clear.  Eyes:     Conjunctiva/sclera: Conjunctivae normal.  Cardiovascular:     Rate and Rhythm: Normal rate and regular rhythm.  Pulmonary:     Effort: Pulmonary effort is normal.     Breath sounds: Normal breath sounds.     Comments: Pulse ox initially 89%, recheck 92% Abdominal:     General: There is no distension.  Skin:    General: Skin is warm and dry.  Neurological:     Mental Status: Mental status is at baseline.     Comments: Walks with single-point cane for safety          Latest Ref Rng & Units 05/03/2024    8:36 AM 02/02/2024    2:39 PM 01/08/2024    2:57 PM  CMP  Glucose 70 - 99 mg/dL 899  71  83   BUN 8 - 23 mg/dL 10  10  10    Creatinine 0.61 - 1.24 mg/dL 9.00  9.05  9.13   Sodium 135 - 145 mmol/L 139  138  141   Potassium 3.5 - 5.1 mmol/L 3.8  4.4  4.1   Chloride 98 - 111 mmol/L 103  103  105   CO2 22 - 32 mmol/L 29  29  31    Calcium 8.9 - 10.3 mg/dL 9.1  9.5  89.8   Total Protein 6.5 - 8.1 g/dL 7.3  7.9  7.7   Total Bilirubin 0.0 - 1.2 mg/dL 1.1  0.9  1.0   Alkaline Phos 38 - 126 U/L 98  111  104   AST 15 - 41 U/L 25  24  23    ALT 0 - 44 U/L 18  17  16      Lipid Panel     Component Value Date/Time   CHOL 163 02/07/2023 1417   TRIG 105 02/07/2023 1417   HDL 70 02/07/2023 1417   CHOLHDL 2.3 02/07/2023 1417   LDLCALC 74 02/07/2023 1417    CBC    Component Value Date/Time   WBC 7.2 05/03/2024 0836   WBC 6.3 12/16/2022 1112   RBC 5.81 05/03/2024 0836   HGB 17.3 (H) 05/03/2024 0836   HGB 14.4 07/28/2019 1116   HCT 50.1 05/03/2024 0836   HCT 43.6 07/28/2019 1116   PLT 222 05/03/2024 0836   PLT 588 (H) 07/28/2019 1116   MCV 86.2 05/03/2024 0836   MCV 85 07/28/2019 1116   MCH 29.8 05/03/2024 0836   MCHC 34.5 05/03/2024 0836   RDW 14.0 05/03/2024 0836   RDW 13.1 07/28/2019 1116   LYMPHSABS 0.6 (L) 05/03/2024 0836   LYMPHSABS 1.1 07/28/2019 1116   MONOABS 0.7 05/03/2024 0836   EOSABS 0.1 05/03/2024 0836   EOSABS 0.5 (H) 07/28/2019 1116   BASOSABS 0.0 05/03/2024 0836   BASOSABS 0.1 07/28/2019 1116    Results for orders placed or performed in visit on 05/03/24  CMP (Cancer Center only)   Collection Time: 05/03/24  8:36 AM  Result Value Ref Range   Sodium 139 135 - 145 mmol/L   Potassium 3.8 3.5 - 5.1 mmol/L   Chloride 103 98 - 111 mmol/L   CO2 29 22 - 32 mmol/L   Glucose, Bld 100 (H) 70 - 99  mg/dL   BUN 10 8 - 23 mg/dL   Creatinine 9.00 9.38 - 1.24 mg/dL   Calcium 9.1 8.9 - 89.6 mg/dL   Total Protein 7.3 6.5 - 8.1 g/dL   Albumin 4.0 3.5 - 5.0 g/dL   AST 25 15 - 41 U/L   ALT 18 0 - 44 U/L   Alkaline Phosphatase 98 38 - 126 U/L   Total Bilirubin 1.1 0.0 - 1.2 mg/dL   GFR, Estimated >39 >39 mL/min   Anion gap 8 5 - 15  CBC with Differential (Cancer Center Only)   Collection Time: 05/03/24  8:36 AM  Result Value Ref Range   WBC Count 7.2 4.0 - 10.5 K/uL   RBC 5.81 4.22 - 5.81 MIL/uL   Hemoglobin 17.3 (H) 13.0 - 17.0 g/dL   HCT 49.8 60.9 - 47.9 %   MCV 86.2 80.0 - 100.0 fL   MCH 29.8 26.0 - 34.0 pg   MCHC 34.5 30.0 - 36.0 g/dL   RDW 85.9 88.4 - 84.4 %   Platelet Count 222 150 - 400 K/uL   nRBC 0.0 0.0 - 0.2 %    Neutrophils Relative % 81 %   Neutro Abs 5.8 1.7 - 7.7 K/uL   Lymphocytes Relative 8 %   Lymphs Abs 0.6 (L) 0.7 - 4.0 K/uL   Monocytes Relative 9 %   Monocytes Absolute 0.7 0.1 - 1.0 K/uL   Eosinophils Relative 1 %   Eosinophils Absolute 0.1 0.0 - 0.5 K/uL   Basophils Relative 1 %   Basophils Absolute 0.0 0.0 - 0.1 K/uL   Immature Granulocytes 0 %   Abs Immature Granulocytes 0.01 0.00 - 0.07 K/uL     ASSESSMENT AND PLAN:  Assessment & Plan Pulmonary emphysema (HCC) Seen today for discussion of cheaper alternative to Trelegy. Clinic pharmacist suggested Breztri  as there may be a patient assistance program available. His exam was unremarkable.  Oxygen  level did improve from 89% initially to 92% Okay for refill of albuterol  and to start Breztri . Sent to pharmacy of his choice. They will assist with patient assistance program  Orders:   albuterol  (VENTOLIN  HFA) 108 (90 Base) MCG/ACT inhaler; Inhale 2 puffs into the lungs every 4 (four) hours as needed for wheezing or shortness of breath.   budesonide -glycopyrrolate -formoterol  (BREZTRI  AEROSPHERE) 160-9-4.8 MCG/ACT AERO inhaler; Inhale 2 puffs into the lungs 2 (two) times daily.       Patient was given the opportunity to ask questions.  Patient verbalized understanding of the plan and was able to repeat key elements of the plan.   This documentation was completed using Paediatric nurse.  Any transcriptional errors are unintentional.     Requested Prescriptions   Signed Prescriptions Disp Refills   albuterol  (VENTOLIN  HFA) 108 (90 Base) MCG/ACT inhaler 8.5 g 6    Sig: Inhale 2 puffs into the lungs every 4 (four) hours as needed for wheezing or shortness of breath.    No follow-ups on file.  Daud Cayer H, NP      [1]  Current Outpatient Medications on File Prior to Visit  Medication Sig Dispense Refill   acetaminophen  (TYLENOL ) 500 MG tablet Take 1,000 mg by mouth every 8 (eight) hours as needed  for moderate pain (pain score 4-6).     Multiple Vitamin (MULTIVITAMIN WITH MINERALS) TABS tablet Take 1 tablet by mouth daily. 30 tablet 0   naproxen sodium (ALEVE) 220 MG tablet Take 220 mg by mouth.     thiamine  100 MG  tablet Take 1 tablet (100 mg total) by mouth daily. 30 tablet 0   traZODone  (DESYREL ) 50 MG tablet Take 1 tablet (50 mg total) by mouth at bedtime as needed for sleep. 30 tablet 6   No current facility-administered medications on file prior to visit.  [2] No Known Allergies  "

## 2024-05-12 NOTE — Telephone Encounter (Signed)
 Can we apply for MAP for Breztri  for this patient?

## 2024-05-13 ENCOUNTER — Other Ambulatory Visit: Payer: Self-pay

## 2024-05-14 ENCOUNTER — Other Ambulatory Visit: Payer: Self-pay

## 2024-05-17 ENCOUNTER — Other Ambulatory Visit: Payer: Self-pay

## 2024-05-21 ENCOUNTER — Other Ambulatory Visit: Payer: Self-pay

## 2024-05-24 ENCOUNTER — Other Ambulatory Visit: Payer: Self-pay

## 2024-08-10 ENCOUNTER — Ambulatory Visit: Payer: Self-pay | Admitting: Family Medicine

## 2024-08-30 ENCOUNTER — Inpatient Hospital Stay

## 2024-09-07 ENCOUNTER — Inpatient Hospital Stay: Admitting: Internal Medicine
